# Patient Record
Sex: Female | Born: 1990 | Race: Black or African American | Hispanic: No | Marital: Married | Smoking: Former smoker
Health system: Southern US, Community
[De-identification: ages and names within clinical notes are randomized; demographics above are authoritative.]

## PROBLEM LIST (undated history)

## (undated) ENCOUNTER — Inpatient Hospital Stay (HOSPITAL_COMMUNITY): Payer: Self-pay

## (undated) DIAGNOSIS — F32A Depression, unspecified: Secondary | ICD-10-CM

## (undated) DIAGNOSIS — F419 Anxiety disorder, unspecified: Secondary | ICD-10-CM

## (undated) DIAGNOSIS — J45909 Unspecified asthma, uncomplicated: Secondary | ICD-10-CM

## (undated) DIAGNOSIS — F329 Major depressive disorder, single episode, unspecified: Secondary | ICD-10-CM

## (undated) DIAGNOSIS — K219 Gastro-esophageal reflux disease without esophagitis: Secondary | ICD-10-CM

## (undated) DIAGNOSIS — D649 Anemia, unspecified: Secondary | ICD-10-CM

## (undated) HISTORY — PX: WISDOM TOOTH EXTRACTION: SHX21

---

## 2008-02-03 DIAGNOSIS — F53 Postpartum depression: Secondary | ICD-10-CM | POA: Insufficient documentation

## 2008-02-03 DIAGNOSIS — D649 Anemia, unspecified: Secondary | ICD-10-CM

## 2008-02-03 HISTORY — DX: Anemia, unspecified: D64.9

## 2011-03-20 ENCOUNTER — Other Ambulatory Visit: Payer: Self-pay

## 2011-03-20 ENCOUNTER — Emergency Department (HOSPITAL_COMMUNITY)
Admission: EM | Admit: 2011-03-20 | Discharge: 2011-03-20 | Disposition: A | Discharge status: Home / Self Care | Payer: Self-pay | Attending: Emergency Medicine | Admitting: Emergency Medicine

## 2011-03-20 ENCOUNTER — Encounter (HOSPITAL_COMMUNITY): Payer: Self-pay

## 2011-03-20 DIAGNOSIS — R42 Dizziness and giddiness: Secondary | ICD-10-CM | POA: Insufficient documentation

## 2011-03-20 DIAGNOSIS — F1610 Hallucinogen abuse, uncomplicated: Secondary | ICD-10-CM

## 2011-03-20 DIAGNOSIS — T43624A Poisoning by amphetamines, undetermined, initial encounter: Secondary | ICD-10-CM | POA: Insufficient documentation

## 2011-03-20 DIAGNOSIS — F1510 Other stimulant abuse, uncomplicated: Secondary | ICD-10-CM | POA: Insufficient documentation

## 2011-03-20 DIAGNOSIS — T43601A Poisoning by unspecified psychostimulants, accidental (unintentional), initial encounter: Secondary | ICD-10-CM | POA: Insufficient documentation

## 2011-03-20 DIAGNOSIS — H9319 Tinnitus, unspecified ear: Secondary | ICD-10-CM | POA: Insufficient documentation

## 2011-03-20 DIAGNOSIS — R5381 Other malaise: Secondary | ICD-10-CM | POA: Insufficient documentation

## 2011-03-20 LAB — DIFFERENTIAL
Basophils Absolute: 0 10*3/uL (ref 0.0–0.1)
Basophils Relative: 0 % (ref 0–1)
Eosinophils Absolute: 0 10*3/uL (ref 0.0–0.7)
Eosinophils Relative: 1 % (ref 0–5)
Monocytes Absolute: 0.4 10*3/uL (ref 0.1–1.0)
Monocytes Relative: 7 % (ref 3–12)
Neutro Abs: 3.8 10*3/uL (ref 1.7–7.7)

## 2011-03-20 LAB — CBC
HCT: 42.4 % (ref 36.0–46.0)
Hemoglobin: 14.7 g/dL (ref 12.0–15.0)
MCH: 28 pg (ref 26.0–34.0)
MCHC: 34.7 g/dL (ref 30.0–36.0)
RDW: 12.5 % (ref 11.5–15.5)

## 2011-03-20 LAB — RAPID URINE DRUG SCREEN, HOSP PERFORMED
Amphetamines: POSITIVE — AB
Opiates: NOT DETECTED

## 2011-03-20 LAB — BASIC METABOLIC PANEL
BUN: 10 mg/dL (ref 6–23)
Calcium: 10.1 mg/dL (ref 8.4–10.5)
Chloride: 106 mEq/L (ref 96–112)
Creatinine, Ser: 0.7 mg/dL (ref 0.50–1.10)
GFR calc Af Amer: >90 mL/min (ref >90)
GFR calc non Af Amer: >90 mL/min (ref >90)

## 2011-03-20 LAB — URINALYSIS, ROUTINE W REFLEX MICROSCOPIC
Bilirubin Urine: NEGATIVE
Ketones, ur: NEGATIVE mg/dL
Nitrite: NEGATIVE
Urobilinogen, UA: 0.2 mg/dL (ref 0.0–1.0)

## 2011-03-20 MED ORDER — POTASSIUM CHLORIDE CRYS ER 20 MEQ PO TBCR
40.0000 meq | EXTENDED_RELEASE_TABLET | Freq: Once | ORAL | Status: AC
  Administered 2011-03-20: 40 meq via ORAL
  Filled 2011-03-20: qty 2

## 2011-03-20 MED ORDER — SODIUM CHLORIDE 0.9 % IV BOLUS (SEPSIS)
1000.0000 mL | Freq: Once | INTRAVENOUS | Status: AC
  Administered 2011-03-20: 1000 mL via INTRAVENOUS

## 2011-03-20 MED ORDER — ONDANSETRON HCL 4 MG/2ML IJ SOLN
4.0000 mg | Freq: Once | INTRAMUSCULAR | Status: AC
  Administered 2011-03-20: 4 mg via INTRAVENOUS
  Filled 2011-03-20: qty 2

## 2011-03-20 NOTE — ED Notes (Signed)
Took a capsule  Did not what is pt sleepy  Friend states it is a recreational drug

## 2011-03-20 NOTE — ED Notes (Signed)
Pt placed in gown, on monitor, with continuous blood pressure and pulse oximetry 

## 2011-03-20 NOTE — ED Provider Notes (Signed)
History     CSN: [id number]  Arrival date & time 03/20/11  1043   First MD Initiated Contact with Patient 03/20/11 1108      Chief Complaint  Patient presents with  . Ingestion    (Consider location/radiation/quality/duration/timing/severity/associated sxs/prior treatment) HPI Comments: She ingested "molly" which is ecstasy.  Complains of some tinnitus and gen weakness and fatigue  Patient is a 21 y.o. female presenting with Ingested Medication. The history is provided by the patient. No language interpreter was used.  Ingestion This is a new problem. The current episode started less than 1 hour ago. The problem occurs constantly. The problem has not changed since onset.Pertinent negatives include no chest pain, no abdominal pain, no headaches and no shortness of breath. The symptoms are aggravated by nothing. The symptoms are relieved by nothing. She has tried nothing for the symptoms. The treatment provided no relief.    History reviewed. No pertinent past medical history.  Past Surgical History  Procedure Date  . Cesarean section     No family history on file.  History  Substance Use Topics  . Smoking status: Current Everyday Smoker  . Smokeless tobacco: Not on file  . Alcohol Use: No    OB History    Grav Para Term Preterm Abortions TAB SAB Ect Mult Living                  Review of Systems  Constitutional: Positive for fatigue. Negative for fever, chills and activity change.  HENT: Negative for congestion, sore throat, rhinorrhea, neck pain and neck stiffness.   Respiratory: Negative for cough and shortness of breath.   Cardiovascular: Negative for chest pain and palpitations.  Gastrointestinal: Negative for nausea, vomiting and abdominal pain.  Genitourinary: Negative for dysuria, urgency, frequency and flank pain.  Neurological: Positive for light-headedness. Negative for dizziness, weakness, numbness and headaches.  All other systems reviewed and are  negative.    Allergies  Review of patient's allergies indicates no known allergies.  Home Medications   Current Outpatient Rx  Name Route Sig Dispense Refill  . ALBUTEROL SULFATE HFA 108 (90 BASE) MCG/ACT IN AERS Inhalation Inhale 2 puffs into the lungs every 6 (six) hours as needed. For shortness of breath    . SERTRALINE HCL 50 MG PO TABS Oral Take 50 mg by mouth daily.      BP 103/72  Pulse 96  Temp(Src) 98.3 F (36.8 C) (Oral)  Resp 16  SpO2 100%  LMP 03/20/2011  Physical Exam  Nursing note and vitals reviewed. Constitutional: She appears well-developed and well-nourished. No distress.  HENT:  Head: Normocephalic and atraumatic.  Mouth/Throat: Oropharynx is clear and moist.  Eyes: Conjunctivae and EOM are normal. Pupils are equal, round, and reactive to light.  Neck: Normal range of motion. Neck supple.  Cardiovascular: Normal rate, regular rhythm, normal heart sounds and intact distal pulses.  Exam reveals no gallop and no friction rub.   No murmur heard. Pulmonary/Chest: Effort normal and breath sounds normal. No respiratory distress.  Abdominal: Soft. Bowel sounds are normal. There is no tenderness.  Musculoskeletal: Normal range of motion. She exhibits no tenderness.  Lymphadenopathy:    She has no cervical adenopathy.  Neurological: No cranial nerve deficit.       Patient sleepy but easily arousable.  Skin: Skin is warm and dry. No rash noted.    ED Course  Procedures (including critical care time)   Date: 03/20/2011  Rate: 64  Rhythm: normal sinus rhythm  QRS Axis: normal  Intervals: normal  ST/T Wave abnormalities: normal  Conduction Disutrbances:none  Narrative Interpretation:   Old EKG Reviewed: none available  Labs Reviewed  CBC - Abnormal; Notable for the following:    RBC 5.25 (*)    All other components within normal limits  BASIC METABOLIC PANEL - Abnormal; Notable for the following:    Potassium 3.1 (*)    All other components within  normal limits  URINE RAPID DRUG SCREEN (HOSP PERFORMED) - Abnormal; Notable for the following:    Amphetamines POSITIVE (*)    Tetrahydrocannabinol POSITIVE (*)    All other components within normal limits  DIFFERENTIAL  URINALYSIS, ROUTINE W REFLEX MICROSCOPIC  PREGNANCY, URINE  GLUCOSE, CAPILLARY   No results found.   1. Ecstasy abuse       MDM  Patient symptoms are secondary to ecstasy abuse. I explained that she should not take ecstasy. Her blood work is unremarkable. Her urine shows amphetamines and THC. She is instructed to refrain from additional drug use and continue aggressive oral hydration at home.        Dayton Bailiff, MD 03/20/11 1315

## 2011-03-20 NOTE — ED Notes (Signed)
Pt took an unknown powdered pill at  1100. Pt will not say why she took it

## 2011-03-20 NOTE — ED Notes (Addendum)
Pt received to [facility] 6 with c/o headache. Pt claimed that she took a powdered pill but does not know what it is. Pt is attached to the monitor. Seen and examined by Dr. Brooke Dare. Pt is noted to be slow to answer questions but appropriate.

## 2011-03-20 NOTE — ED Notes (Signed)
CBG 90 at 11:41

## 2011-11-05 ENCOUNTER — Encounter (HOSPITAL_COMMUNITY): Payer: Self-pay | Admitting: *Deleted

## 2011-11-05 ENCOUNTER — Inpatient Hospital Stay (HOSPITAL_COMMUNITY)
Admission: AD | Admit: 2011-11-05 | Discharge: 2011-11-05 | Disposition: A | Discharge status: Home / Self Care | Payer: BC Managed Care – PPO | Source: Ambulatory Visit | Attending: Obstetrics & Gynecology | Admitting: Obstetrics & Gynecology

## 2011-11-05 DIAGNOSIS — O210 Mild hyperemesis gravidarum: Secondary | ICD-10-CM | POA: Insufficient documentation

## 2011-11-05 DIAGNOSIS — O219 Vomiting of pregnancy, unspecified: Secondary | ICD-10-CM | POA: Diagnosis present

## 2011-11-05 HISTORY — DX: Unspecified asthma, uncomplicated: J45.909

## 2011-11-05 LAB — URINALYSIS, ROUTINE W REFLEX MICROSCOPIC
Bilirubin Urine: NEGATIVE
Glucose, UA: NEGATIVE mg/dL
Ketones, ur: NEGATIVE mg/dL
Leukocytes, UA: NEGATIVE
pH: 6 (ref 5.0–8.0)

## 2011-11-05 MED ORDER — ONDANSETRON HCL 4 MG PO TABS: 4.0000 mg | ORAL_TABLET | Freq: Three times a day (TID) | ORAL | Status: DC | PRN

## 2011-11-05 MED ORDER — SODIUM CHLORIDE 0.9 % IV SOLN
25.0000 mg | INTRAVENOUS | Status: AC
  Administered 2011-11-05: 25 mg via INTRAVENOUS
  Filled 2011-11-05: qty 1

## 2011-11-05 MED ORDER — ONDANSETRON HCL 4 MG/2ML IJ SOLN
4.0000 mg | INTRAMUSCULAR | Status: AC
  Administered 2011-11-05: 4 mg via INTRAVENOUS
  Filled 2011-11-05: qty 2

## 2011-11-05 MED ORDER — PROMETHAZINE HCL 25 MG PO TABS: 12.5000 mg | ORAL_TABLET | Freq: Four times a day (QID) | ORAL | Status: DC | PRN

## 2011-11-05 MED ORDER — LACTATED RINGERS IV BOLUS (SEPSIS): 1000.0000 mL | Freq: Once | INTRAVENOUS | Status: DC

## 2011-11-05 NOTE — MAU Provider Note (Signed)
Chief Complaint: Emesis During Pregnancy   First Provider Initiated Contact with Patient 11/05/11 2013     SUBJECTIVE HPI: [name] is a 21 y.o. G1P1001 at Unknown by LMP who presents to maternity admissions reporting nausea and vomiting x6 in last 24 hours, difficulty keeping any food or liquids down, and diarrhea x3 since yesterday.  [facility]  Patient's last menstrual period was 09/25/2011.  She reports some fatigue and weakness today but denies abdominal pain, LOF, vaginal bleeding, vaginal itching/burning, urinary symptoms, h/a, dizziness, n/v, or fever/chills.     Past Medical History  Diagnosis Date  . Asthma    Past Surgical History  Procedure Date  . Cesarean section   . Wisdom tooth extraction    History   Social History  . Marital Status: Single    Spouse Name: N/A    Number of Children: N/A  . Years of Education: N/A   Occupational History  . Not on file.   Social History Main Topics  . Smoking status: Former Smoker    Quit date: 11/04/2011  . Smokeless tobacco: Not on file  . Alcohol Use: No  . Drug Use: Yes  . Sexually Active: Yes   Other Topics Concern  . Not on file   Social History Narrative  . No narrative on file   No current facility-administered medications on file prior to encounter.   Current Outpatient Prescriptions on File Prior to Encounter  Medication Sig Dispense Refill  . albuterol (PROVENTIL HFA;VENTOLIN HFA) 108 (90 BASE) MCG/ACT inhaler Inhale 2 puffs into the lungs every 6 (six) hours as needed. For shortness of breath      . sertraline (ZOLOFT) 50 MG tablet Take 50 mg by mouth daily.       No Known Allergies  ROS: Pertinent items in HPI  OBJECTIVE Blood pressure 110/72, pulse 79, temperature 98.3 F (36.8 C), temperature source Oral, resp. rate 18, height 5' 5.5" (1.664 m), weight 58.968 kg (130 lb), last menstrual period 09/25/2011, SpO2 100.00%. GENERAL:  Well-developed, well-nourished female in no acute distress.  HEENT: Normocephalic HEART: normal rate RESP: normal effort ABDOMEN: Soft, non-tender EXTREMITIES: Nontender, no edema NEURO: Alert and oriented Pelvic exam deferred  LAB RESULTS Results for orders placed during the hospital encounter of 11/05/11 (from the past 24 hour(s))  URINALYSIS, ROUTINE W REFLEX MICROSCOPIC     Status: Abnormal   Collection Time   11/05/11  7:25 PM      Component Value Range   Color, Urine YELLOW  YELLOW   APPearance CLEAR  CLEAR   Specific Gravity, Urine >1.030 (*) 1.005 - 1.030   pH 6.0  5.0 - 8.0   Glucose, UA NEGATIVE  NEGATIVE mg/dL   Hgb urine dipstick NEGATIVE  NEGATIVE   Bilirubin Urine NEGATIVE  NEGATIVE   Ketones, ur NEGATIVE  NEGATIVE mg/dL   Protein, ur NEGATIVE  NEGATIVE mg/dL   Urobilinogen, UA 0.2  0.0 - 1.0 mg/dL   Nitrite NEGATIVE  NEGATIVE   Leukocytes, UA NEGATIVE  NEGATIVE  POCT PREGNANCY, URINE     Status: Abnormal   Collection Time   11/05/11  7:46 PM      Component Value Range   Preg Test, Ur POSITIVE (*) NEGATIVE    ASSESSMENT 1. Nausea/vomiting in pregnancy     PLAN IV Phenergan 25 mg in 1000 ml LR  Zofran 4 mg IV Pepcid 20 mg IV Pt had relief of symptoms with  treatment in MAU  Discharge home Phenergan 12.5 mg PO Q6 hours Zofran 4 mg PO Q 8 hours Keep scheduled prenatal appointment Return to MAU as needed    Medication List     As of 11/05/2011 10:11 PM    TAKE these medications         albuterol 108 (90 BASE) MCG/ACT inhaler   Commonly known as: PROVENTIL HFA;VENTOLIN HFA   Inhale 2 puffs into the lungs every 6 (six) hours as needed. For shortness of breath      ondansetron 4 MG tablet   Commonly known as: ZOFRAN   Take 1 tablet (4 mg total) by mouth every 8 (eight) hours as needed for nausea.      promethazine 25 MG tablet   Commonly known as: PHENERGAN   Take 0.5 tablets (12.5 mg total) by mouth every 6 (six) hours as needed for nausea.       sertraline 50 MG tablet   Commonly known as: ZOLOFT   Take 50 mg by mouth daily.           Sharen Counter Certified Nurse-Midwife 11/05/2011  8:40 PM

## 2011-11-05 NOTE — MAU Note (Signed)
Pt reports vomiting since yesterday, diarrhea.Marland Kitchen

## 2011-11-05 NOTE — MAU Note (Signed)
Pt reports she [facility]. LMP 09/25/2011, vomiting with pregnancy but worsened over last 24 hours.

## 2011-11-13 ENCOUNTER — Inpatient Hospital Stay (HOSPITAL_COMMUNITY)
Admission: AD | Admit: 2011-11-13 | Discharge: 2011-11-13 | Disposition: A | Discharge status: Home / Self Care | Payer: BC Managed Care – PPO | Source: Ambulatory Visit | Attending: Obstetrics and Gynecology | Admitting: Obstetrics and Gynecology

## 2011-11-13 ENCOUNTER — Encounter (HOSPITAL_COMMUNITY): Payer: Self-pay | Admitting: *Deleted

## 2011-11-13 DIAGNOSIS — O210 Mild hyperemesis gravidarum: Secondary | ICD-10-CM

## 2011-11-13 HISTORY — DX: Major depressive disorder, single episode, unspecified: F32.9

## 2011-11-13 HISTORY — DX: Depression, unspecified: F32.A

## 2011-11-13 LAB — URINALYSIS, ROUTINE W REFLEX MICROSCOPIC
Hgb urine dipstick: NEGATIVE
Leukocytes, UA: NEGATIVE
Specific Gravity, Urine: 1.02 (ref 1.005–1.030)
Urobilinogen, UA: 0.2 mg/dL (ref 0.0–1.0)

## 2011-11-13 MED ORDER — ONDANSETRON 8 MG PO TBDP: 8.0000 mg | ORAL_TABLET | Freq: Three times a day (TID) | ORAL | Status: DC | PRN

## 2011-11-13 MED ORDER — PROMETHAZINE HCL 25 MG RE SUPP: 25.0000 mg | Freq: Once | RECTAL | Status: DC

## 2011-11-13 MED ORDER — ONDANSETRON 8 MG PO TBDP
8.0000 mg | ORAL_TABLET | Freq: Once | ORAL | Status: AC
  Administered 2011-11-13: 8 mg via ORAL
  Filled 2011-11-13: qty 1

## 2011-11-13 NOTE — MAU Provider Note (Signed)
History     CSN: [id number]  Arrival date and time: 11/13/11 1923   None     Chief Complaint  Patient presents with  . Morning Sickness   HPI  Pt is a G2P1001 here at 7 wks IUP by LMP here with report of vomiting x 1.5 weeks.  Pt reports having medication, but unable to hold it down.  Pt states unable to hold down food or drink. No report of fever, body aches, diarrhea or chills.  Reports vomiting x 7 today.     Past Medical History  Diagnosis Date  . Asthma     Past Surgical History  Procedure Date  . Cesarean section   . Wisdom tooth extraction     Family History  Problem Relation Age of Onset  . Other Neg Hx     History  Substance Use Topics  . Smoking status: Former Smoker    Quit date: 11/04/2011  . Smokeless tobacco: Not on file  . Alcohol Use: No    Allergies: No Known Allergies  Prescriptions prior to admission  Medication Sig Dispense Refill  . albuterol (PROVENTIL HFA;VENTOLIN HFA) 108 (90 BASE) MCG/ACT inhaler Inhale 2 puffs into the lungs every 6 (six) hours as needed. For shortness of breath      . ondansetron (ZOFRAN) 4 MG tablet Take 1 tablet (4 mg total) by mouth every 8 (eight) hours as needed for nausea.  20 tablet  3  . promethazine (PHENERGAN) 25 MG tablet Take 0.5 tablets (12.5 mg total) by mouth every 6 (six) hours as needed for nausea.  30 tablet  2  . sertraline (ZOLOFT) 50 MG tablet Take 50 mg by mouth daily.        Review of Systems  Gastrointestinal: Positive for nausea and vomiting.  All other systems reviewed and are negative.   Physical Exam   Blood pressure 108/66, pulse 96, temperature 98.8 F (37.1 C), temperature source Oral, resp. rate 24, height 5' 5.5" (1.664 m), weight 134 lb 6.4 oz (60.963 kg), last menstrual period 09/25/2011.  Physical Exam  Constitutional: She is oriented to person, place, and time. She appears well-developed and well-nourished. No distress.  HENT:  Head: Normocephalic.  Mouth/Throat: Mucous  membranes are normal. Mucous membranes are not dry.  Neck: Normal range of motion. Neck supple.  Cardiovascular: Normal rate, regular rhythm and normal heart sounds.   Respiratory: Effort normal and breath sounds normal.  GI: Soft. There is no tenderness.       Hyperactive bowel sounds  Neurological: She is alert and oriented to person, place, and time.  Skin: Skin is warm and dry.    MAU Course  Procedures  Results for orders placed during the hospital encounter of 11/13/11 (from the past 24 hour(s))  URINALYSIS, ROUTINE W REFLEX MICROSCOPIC     Status: Abnormal   Collection Time   11/13/11  7:28 PM      Component Value Range   Color, Urine YELLOW  YELLOW   APPearance HAZY (*) CLEAR   Specific Gravity, Urine 1.020  1.005 - 1.030   pH 7.5  5.0 - 8.0   Glucose, UA NEGATIVE  NEGATIVE mg/dL   Hgb urine dipstick NEGATIVE  NEGATIVE   Bilirubin Urine NEGATIVE  NEGATIVE   Ketones, ur NEGATIVE  NEGATIVE mg/dL   Protein, ur NEGATIVE  NEGATIVE mg/dL   Urobilinogen, UA 0.2  0.0 - 1.0 mg/dL   Nitrite NEGATIVE  NEGATIVE   Leukocytes, UA NEGATIVE  NEGATIVE  Zofran 8 mg ODT  Assessment and Plan    [facility] 11/13/2011, 7:59 PM   Assumed care of pt at 2100.  Tolerating PO's after Zofran ODT. Denies Nausea/Vomiting. Requesting Rx for Zofran ODT.  Assessment: 1. Morning sickness    Plan: D/C home per consult w/ Dr. Ambrose Mantle. Follow-up Information    Follow up with Bing Plume, MD. (as scheduled)    Contact information:   [facility] [address], [address] [place] [address] [phone]       Follow up with [facility]ERN[facility]. (As needed if symptoms worsen)    Contact information:   [address] [id number] [place] [address] [phone]          Medication List     As of 11/13/2011 10:34 PM    START taking these medications         ondansetron 8 MG  disintegrating tablet   Commonly known as: ZOFRAN-ODT   Take 1 tablet (8 mg total) by mouth every 8 (eight) hours as needed for nausea.      CONTINUE taking these medications         albuterol 108 (90 BASE) MCG/ACT inhaler   Commonly known as: PROVENTIL HFA;VENTOLIN HFA      ondansetron 4 MG tablet   Commonly known as: ZOFRAN   Take 1 tablet (4 mg total) by mouth every 8 (eight) hours as needed for nausea.      promethazine 25 MG tablet   Commonly known as: PHENERGAN   Take 0.5 tablets (12.5 mg total) by mouth every 6 (six) hours as needed for nausea.      sertraline 50 MG tablet   Commonly known as: ZOLOFT          Where to get your medications    These are the prescriptions that you need to pick up.   You may get these medications from any pharmacy.         ondansetron 8 MG disintegrating tablet            [place] 11/13/2011 10:34 PM

## 2011-11-13 NOTE — MAU Note (Signed)
I've been throwing up for a wk and a half. I have medicine for it but keep throwing it up. Today unable to keep down anything. No diarrhea

## 2011-12-07 LAB — OB RESULTS CONSOLE HEPATITIS B SURFACE ANTIGEN: Hepatitis B Surface Ag: NEGATIVE

## 2011-12-07 LAB — OB RESULTS CONSOLE RPR: RPR: NONREACTIVE

## 2011-12-08 ENCOUNTER — Inpatient Hospital Stay (HOSPITAL_COMMUNITY)
Admission: AD | Admit: 2011-12-08 | Discharge: 2011-12-08 | Disposition: A | Discharge status: Home / Self Care | Payer: BC Managed Care – PPO | Source: Ambulatory Visit | Attending: Obstetrics and Gynecology | Admitting: Obstetrics and Gynecology

## 2011-12-08 ENCOUNTER — Encounter (HOSPITAL_COMMUNITY): Payer: Self-pay | Admitting: *Deleted

## 2011-12-08 DIAGNOSIS — E860 Dehydration: Secondary | ICD-10-CM | POA: Insufficient documentation

## 2011-12-08 DIAGNOSIS — O210 Mild hyperemesis gravidarum: Secondary | ICD-10-CM

## 2011-12-08 DIAGNOSIS — O211 Hyperemesis gravidarum with metabolic disturbance: Secondary | ICD-10-CM | POA: Insufficient documentation

## 2011-12-08 LAB — URINALYSIS, ROUTINE W REFLEX MICROSCOPIC
Bilirubin Urine: NEGATIVE
Nitrite: NEGATIVE
Protein, ur: NEGATIVE mg/dL
Specific Gravity, Urine: 1.025 (ref 1.005–1.030)
Urobilinogen, UA: 0.2 mg/dL (ref 0.0–1.0)

## 2011-12-08 MED ORDER — DOXYLAMINE-PYRIDOXINE 10-10 MG PO TBEC: 2.0000 | DELAYED_RELEASE_TABLET | Freq: Every day | ORAL | Status: DC

## 2011-12-08 MED ORDER — PROMETHAZINE HCL 25 MG/ML IJ SOLN
25.0000 mg | Freq: Once | INTRAVENOUS | Status: AC
  Administered 2011-12-08: 25 mg via INTRAVENOUS
  Filled 2011-12-08: qty 1

## 2011-12-08 MED ORDER — PROMETHAZINE HCL 25 MG RE SUPP: 25.0000 mg | Freq: Four times a day (QID) | RECTAL | Status: DC | PRN

## 2011-12-08 MED ORDER — ONDANSETRON 4 MG PO TBDP: 4.0000 mg | ORAL_TABLET | Freq: Four times a day (QID) | ORAL | Status: DC | PRN

## 2011-12-08 NOTE — MAU Note (Signed)
Been throwing up a lot.  Head hurts (since yesterday), been really dizzy, eyes blurring.  Going on since Friday.  Was given meds from MD; not taken since Friday- thinks it made it worse.

## 2011-12-08 NOTE — MAU Provider Note (Signed)
History     CSN: [id number]  Arrival date and time: 12/08/11 1349   First Provider Initiated Contact with Patient 12/08/11 1443      Chief Complaint  Patient presents with  . Emesis  . Dizziness   HPI 21 y.o. G2P1001 at [redacted]w[redacted]d with N/V for 4 days (since Friday). Was doing better since last MAU visit. Able to keep down a little food and fluids but not her nausea medications. Decreased urination, no dysuria. Constipation alternating with diarrhea with abdominal cramps esp in AM. No fever/chills. Headache, blurry vision. No bleeding, mild cramping.   Last office visit yesterday but did not mention nausea/vomiting because it was not that bad at the time.   OB History    Grav Para Term Preterm Abortions TAB SAB Ect Mult Living   2 1 1  0 0 0 0 0 0 1      Past Medical History  Diagnosis Date  . Asthma   . Depression     Past Surgical History  Procedure Date  . Cesarean section   . Wisdom tooth extraction     Family History  Problem Relation Age of Onset  . Other Neg Hx     History  Substance Use Topics  . Smoking status: Former Smoker    Quit date: 11/04/2011  . Smokeless tobacco: Not on file  . Alcohol Use: No    Allergies: No Known Allergies  Prescriptions prior to admission  Medication Sig Dispense Refill  . Prenatal Vit-Fe Fumarate-FA (PRENATAL MULTIVITAMIN) TABS Take 1 tablet by mouth daily.      Marland Kitchen albuterol (PROVENTIL HFA;VENTOLIN HFA) 108 (90 BASE) MCG/ACT inhaler Inhale 2 puffs into the lungs every 6 (six) hours as needed. For shortness of breath        Review of Systems  Constitutional: Negative for fever and chills.  Eyes: Positive for blurred vision.  Gastrointestinal: Positive for nausea, vomiting, abdominal pain, diarrhea and constipation. Negative for blood in stool.  Genitourinary: Negative for dysuria, urgency and flank pain.  Musculoskeletal: Negative for back pain.  Neurological: Positive for dizziness and headaches.   Physical Exam    Blood pressure 113/70, pulse 98, temperature 98.2 F (36.8 C), temperature source Oral, resp. rate 18, height 5\' 6"  (1.676 m), weight 60.328 kg (133 lb), last menstrual period 09/25/2011.  Physical Exam  Constitutional: She is oriented to person, place, and time. She appears well-developed and well-nourished. No distress.  HENT:  Head: Normocephalic and atraumatic.       Mucous membranes somewhat dry  Neck: Normal range of motion. Neck supple.  Cardiovascular: Normal rate, regular rhythm and normal heart sounds.   Respiratory: Effort normal and breath sounds normal. No respiratory distress.  GI: Soft. There is no tenderness. There is no rebound and no guarding.  Musculoskeletal: Normal range of motion. She exhibits no edema and no tenderness.  Neurological: She is alert and oriented to person, place, and time.  Skin: Skin is warm and dry.  Psychiatric: She has a normal mood and affect.   Results for orders placed during the hospital encounter of 12/08/11 (from the past 24 hour(s))  URINALYSIS, ROUTINE W REFLEX MICROSCOPIC     Status: Abnormal   Collection Time   12/08/11  2:15 PM      Component Value Range   Color, Urine YELLOW  YELLOW   APPearance HAZY (*) CLEAR   Specific Gravity, Urine 1.025  1.005 - 1.030   pH 6.5  5.0 - 8.0   Glucose,  UA NEGATIVE  NEGATIVE mg/dL   Hgb urine dipstick NEGATIVE  NEGATIVE   Bilirubin Urine NEGATIVE  NEGATIVE   Ketones, ur 15 (*) NEGATIVE mg/dL   Protein, ur NEGATIVE  NEGATIVE mg/dL   Urobilinogen, UA 0.2  0.0 - 1.0 mg/dL   Nitrite NEGATIVE  NEGATIVE   Leukocytes, UA NEGATIVE  NEGATIVE     MAU Course  Procedures  1 liter LR with phenergan given. Patient feeling better Did void x 1 Sipping water, no vomiting  Assessment and Plan  21 y.o. G2P1001 at [redacted]w[redacted]d with Nausea/vomiting, mild dehydration - Rx for Diclegis, Zofran ODT, phenergan supp Tolerating PO at discharge Discussed with Dr. Ambrose Mantle - d/c home and f/u as scheduled or as  needed.  Napoleon Form 12/08/2011, 2:45 PM

## 2012-02-17 ENCOUNTER — Inpatient Hospital Stay (HOSPITAL_COMMUNITY)
Admission: AD | Admit: 2012-02-17 | Discharge: 2012-02-17 | Disposition: A | Discharge status: Home / Self Care | Payer: BC Managed Care – PPO | Source: Ambulatory Visit | Attending: Obstetrics and Gynecology | Admitting: Obstetrics and Gynecology

## 2012-02-17 ENCOUNTER — Encounter (HOSPITAL_COMMUNITY): Payer: Self-pay | Admitting: *Deleted

## 2012-02-17 DIAGNOSIS — J45909 Unspecified asthma, uncomplicated: Secondary | ICD-10-CM | POA: Diagnosis present

## 2012-02-17 DIAGNOSIS — O99891 Other specified diseases and conditions complicating pregnancy: Secondary | ICD-10-CM | POA: Insufficient documentation

## 2012-02-17 DIAGNOSIS — F1610 Hallucinogen abuse, uncomplicated: Secondary | ICD-10-CM | POA: Diagnosis not present

## 2012-02-17 DIAGNOSIS — J3489 Other specified disorders of nose and nasal sinuses: Secondary | ICD-10-CM | POA: Insufficient documentation

## 2012-02-17 DIAGNOSIS — J45901 Unspecified asthma with (acute) exacerbation: Secondary | ICD-10-CM

## 2012-02-17 MED ORDER — IPRATROPIUM BROMIDE 0.02 % IN SOLN
0.5000 mg | Freq: Once | RESPIRATORY_TRACT | Status: AC
  Administered 2012-02-17: 0.5 mg via RESPIRATORY_TRACT

## 2012-02-17 MED ORDER — PSEUDOEPHEDRINE HCL 60 MG PO TABS: 60.0000 mg | ORAL_TABLET | ORAL | Status: DC | PRN

## 2012-02-17 MED ORDER — BECLOMETHASONE DIPROPIONATE 40 MCG/ACT IN AERS: 2.0000 | INHALATION_SPRAY | Freq: Two times a day (BID) | RESPIRATORY_TRACT | Status: DC

## 2012-02-17 MED ORDER — CETIRIZINE HCL 10 MG PO CAPS: 1.0000 | ORAL_CAPSULE | Freq: Every day | ORAL | Status: DC

## 2012-02-17 MED ORDER — ALBUTEROL SULFATE (5 MG/ML) 0.5% IN NEBU
2.5000 mg | INHALATION_SOLUTION | Freq: Once | RESPIRATORY_TRACT | Status: AC
  Administered 2012-02-17: 2.5 mg via RESPIRATORY_TRACT

## 2012-02-17 MED ORDER — ALBUTEROL SULFATE (5 MG/ML) 0.5% IN NEBU
INHALATION_SOLUTION | RESPIRATORY_TRACT | Status: AC
  Administered 2012-02-17: 2.5 mg via RESPIRATORY_TRACT
  Filled 2012-02-17: qty 0.5

## 2012-02-17 MED ORDER — IPRATROPIUM BROMIDE 0.02 % IN SOLN
RESPIRATORY_TRACT | Status: AC
  Administered 2012-02-17: 0.5 mg via RESPIRATORY_TRACT
  Filled 2012-02-17: qty 2.5

## 2012-02-17 NOTE — MAU Provider Note (Signed)
History     CSN: [id number]  Arrival date and time: 02/17/12 1205   First Provider Initiated Contact with Patient 02/17/12 1230      Chief Complaint  Patient presents with  . Asthma  . Respiratory Distress   HPI This is a 22 y.o. female at [redacted]w[redacted]d who presents with c/o wheezing.  Has had asthma since childhood but has only ever used an inhaler. Has never seen an allergy specialist. Over the past year, has had to use her inhaler multiple times per day on a daily basis. Denies fever but has had recent nasal congestion and cold symptoms. [facility]   RN Note:  Pt sent from Dr. Emeline Darling office for wheezing in lung fields. Pt is asthmatic and requiring inhaler to be used x 4 in an hour. Sent to MAU for breathing treatments.      OB History    Grav Para Term Preterm Abortions TAB SAB Ect Mult Living   2 1 1  0 0 0 0 0 0 1      Past Medical History  Diagnosis Date  . Asthma   . Depression     Past Surgical History  Procedure Date  . Cesarean section   . Wisdom tooth extraction     Family History  Problem Relation Age of Onset  . Other Neg Hx   . Asthma Mother   . Arthritis Mother   . Diabetes Mother   . Hypertension Mother   . Hypertension Father   . Asthma Sister     History  Substance Use Topics  . Smoking status: Former Smoker    Quit date: 11/04/2011  . Smokeless tobacco: Not on file  . Alcohol Use: No    Allergies: No Known Allergies  Prescriptions prior to admission  Medication Sig Dispense Refill  . albuterol (PROVENTIL HFA;VENTOLIN HFA) 108 (90 BASE) MCG/ACT inhaler Inhale 2 puffs into the lungs every 6 (six) hours as needed. For shortness of breath      . Prenatal Vit-Fe Fumarate-FA (PRENATAL MULTIVITAMIN) TABS Take 1 tablet by mouth daily.        Review of Systems  Constitutional: Negative for fever and chills.  HENT: Positive for congestion. Negative for sore throat.   Respiratory: Positive for cough and wheezing.     Gastrointestinal: Negative for heartburn, nausea, vomiting and abdominal pain.  Genitourinary: Negative for dysuria.  Musculoskeletal: Negative for myalgias.  Neurological: Negative for headaches.    Physical Exam   Blood pressure 123/68, pulse 102, temperature 98.8 F (37.1 C), temperature source Oral, resp. rate 20, height 5' 5.5" (1.664 m), weight 64.864 kg (143 lb), last menstrual period 09/25/2011, SpO2 100.00%.  Physical Exam  Constitutional: She is oriented to person, place, and time. She appears well-developed and well-nourished. No distress.  HENT:  Head: Normocephalic.  Cardiovascular: Normal rate.   Respiratory: Effort normal. No respiratory distress. She has wheezes. She has no rales. She exhibits no tenderness.  GI: Soft. She exhibits no distension. There is no tenderness. There is no rebound and no guarding.  Musculoskeletal: Normal range of motion.  Neurological: She is alert and oriented to person, place, and time.  Skin: Skin is warm and dry.  Psychiatric: She has a normal mood and affect.    MAU Course  Procedures  MDM Had Albuterol treatment with good relief. No wheezing now. Lungs clear bilaterally. Has a lot of nasal congestion.   Assessment and Plan  A:  SIUP at [redacted]w[redacted]d  Asthma Flare      Nasal Congestion, probably a cold        P:  Discussed with Dr Jackelyn Knife       Will Rx Zyrtec to cover any baseline allergy response       Will Rx QVAR inhaler per asthma guidelines bid        Office to refer her to Allergy/Asthma specialist for long term asthma plan       Reviewed rescue inhaler should be used only when wheezing despite baseline meds.        Rx sudafed for nasal congestion         [name] 02/17/2012, 2:00 PM

## 2012-02-17 NOTE — MAU Note (Signed)
Pt sent from Dr. Emeline Darling office for wheezing in lung fields.  Pt is asthmatic and requiring inhaler to be used x 4 in an hour.  Sent to MAU for breathing treatments.

## 2012-03-19 ENCOUNTER — Inpatient Hospital Stay (HOSPITAL_COMMUNITY)
Admission: AD | Admit: 2012-03-19 | Discharge: 2012-03-19 | Disposition: A | Discharge status: Home / Self Care | Payer: Medicaid Other | Source: Ambulatory Visit | Attending: Obstetrics and Gynecology | Admitting: Obstetrics and Gynecology

## 2012-03-19 ENCOUNTER — Encounter (HOSPITAL_COMMUNITY): Payer: Self-pay | Admitting: *Deleted

## 2012-03-19 DIAGNOSIS — R109 Unspecified abdominal pain: Secondary | ICD-10-CM

## 2012-03-19 DIAGNOSIS — O99891 Other specified diseases and conditions complicating pregnancy: Secondary | ICD-10-CM | POA: Insufficient documentation

## 2012-03-19 DIAGNOSIS — O4700 False labor before 37 completed weeks of gestation, unspecified trimester: Secondary | ICD-10-CM | POA: Insufficient documentation

## 2012-03-19 DIAGNOSIS — O26899 Other specified pregnancy related conditions, unspecified trimester: Secondary | ICD-10-CM

## 2012-03-19 LAB — URINALYSIS, ROUTINE W REFLEX MICROSCOPIC
Glucose, UA: NEGATIVE mg/dL
Hgb urine dipstick: NEGATIVE
Ketones, ur: NEGATIVE mg/dL
Protein, ur: NEGATIVE mg/dL
Urobilinogen, UA: 0.2 mg/dL (ref 0.0–1.0)

## 2012-03-19 LAB — CBC
HCT: 33 % — ABNORMAL LOW (ref 36.0–46.0)
Hemoglobin: 10.9 g/dL — ABNORMAL LOW (ref 12.0–15.0)
MCH: 26.9 pg (ref 26.0–34.0)
MCV: 81.5 fL (ref 78.0–100.0)
RBC: 4.05 MIL/uL (ref 3.87–5.11)
WBC: 9 10*3/uL (ref 4.0–10.5)

## 2012-03-19 LAB — URINE MICROSCOPIC-ADD ON

## 2012-03-19 MED ORDER — SIMETHICONE 80 MG PO CHEW
80.0000 mg | CHEWABLE_TABLET | Freq: Once | ORAL | Status: AC
  Administered 2012-03-19: 80 mg via ORAL
  Filled 2012-03-19: qty 1

## 2012-03-19 MED ORDER — ACETAMINOPHEN 325 MG PO TABS
650.0000 mg | ORAL_TABLET | Freq: Once | ORAL | Status: AC
  Administered 2012-03-19: 650 mg via ORAL
  Filled 2012-03-19: qty 2

## 2012-03-19 NOTE — MAU Note (Signed)
Got off work at 2100 and was cramping some. Contractions started about 2245. No bleeding or d/c.Marland Kitchen

## 2012-03-19 NOTE — MAU Provider Note (Signed)
History     CSN: [id number]  Arrival date and time: 03/19/12 4098   First Provider Initiated Contact with Patient 03/19/12 0229      Chief Complaint  Patient presents with  . Contractions   HPI [name] 22 y.o. [redacted]w[redacted]d  Comes to MAU with lower abdominal pain since 10 pm tonight.  Pain continued to progress until she was feeling contractions so came to MAU.  While in MAU contractions seemed to slow down (only had 2 in first hour in MAU) but now has continuous lower abdominal pain.  Is feeling the baby move.  Last BM was today.  No vomiting or diarrhea.  No change in appetite.  OB History   Grav Para Term Preterm Abortions TAB SAB Ect Mult Living   2 1 1  0 0 0 0 0 0 1      Past Medical History  Diagnosis Date  . Asthma   . Depression     Past Surgical History  Procedure Laterality Date  . Cesarean section    . Wisdom tooth extraction      Family History  Problem Relation Age of Onset  . Other Neg Hx   . Asthma Mother   . Arthritis Mother   . Diabetes Mother   . Hypertension Mother   . Hypertension Father   . Asthma Sister     History  Substance Use Topics  . Smoking status: Former Smoker    Quit date: 11/04/2011  . Smokeless tobacco: Not on file  . Alcohol Use: No    Allergies: No Known Allergies  Prescriptions prior to admission  Medication Sig Dispense Refill  . albuterol (PROVENTIL HFA;VENTOLIN HFA) 108 (90 BASE) MCG/ACT inhaler Inhale 2 puffs into the lungs every 6 (six) hours as needed. For shortness of breath      . beclomethasone (QVAR) 40 MCG/ACT inhaler Inhale 2 puffs into the lungs 2 (two) times daily.  1 Inhaler  12  . Cetirizine HCl 10 MG CAPS Take 1 capsule (10 mg total) by mouth daily.  30 capsule  2  . Prenatal Vit-Fe Fumarate-FA (PRENATAL MULTIVITAMIN) TABS Take 1 tablet by mouth daily.      . pseudoephedrine (SUDAFED) 60 MG tablet Take 1 tablet (60 mg total) by mouth every 4 (four) hours as needed for congestion.  30 tablet  0     Review of Systems  Constitutional: Negative for fever.       Normal appetite  Gastrointestinal: Positive for abdominal pain. Negative for nausea, vomiting, diarrhea and constipation.  Genitourinary:       No vaginal discharge. No vaginal bleeding.  No vaginal leaking. No dysuria.   Physical Exam   Blood pressure 130/68, pulse 96, temperature 97.9 F (36.6 C), resp. rate 20, height 5' 5.5" (1.664 m), weight 71.305 kg (157 lb 3.2 oz), last menstrual period 09/25/2011, SpO2 100.00%.  Physical Exam  Nursing note and vitals reviewed. Constitutional: She is oriented to person, place, and time. She appears well-developed and well-nourished.  HENT:  Head: Normocephalic.  Eyes: EOM are normal.  Neck: Neck supple.  GI: Soft. There is tenderness. There is no rebound and no guarding.  Tender all across lower abdomen.  Is very sensitive to any touch.  Provider gently palpating for contractions was uncomfortable for client. FHT 150 on monitor with moderate variability.  Genitourinary:  Speculum exam done.  Small amount of white discharge.  No leaking.  No bleeding Cervix appears closed. Cervical exam - closed and thick Cervical  exam closed and thick.  FFN not sent.  Musculoskeletal: Normal range of motion.  Neurological: She is alert and oriented to person, place, and time.  Skin: Skin is warm and dry.  Psychiatric: She has a normal mood and affect.    MAU Course  Procedures Results for orders placed during the hospital encounter of 03/19/12 (from the past 24 hour(s))  URINALYSIS, ROUTINE W REFLEX MICROSCOPIC     Status: Abnormal   Collection Time    03/19/12  1:40 AM      Result Value Range   Color, Urine YELLOW  YELLOW   APPearance HAZY (*) CLEAR   Specific Gravity, Urine 1.020  1.005 - 1.030   pH 7.5  5.0 - 8.0   Glucose, UA NEGATIVE  NEGATIVE mg/dL   Hgb urine dipstick NEGATIVE  NEGATIVE   Bilirubin Urine NEGATIVE  NEGATIVE   Ketones, ur NEGATIVE  NEGATIVE mg/dL   Protein,  ur NEGATIVE  NEGATIVE mg/dL   Urobilinogen, UA 0.2  0.0 - 1.0 mg/dL   Nitrite NEGATIVE  NEGATIVE   Leukocytes, UA TRACE (*) NEGATIVE  URINE MICROSCOPIC-ADD ON     Status: None   Collection Time    03/19/12  1:40 AM      Result Value Range   Squamous Epithelial / LPF RARE  RARE   WBC, UA 0-2  <3 WBC/hpf   RBC / HPF 0-2  <3 RBC/hpf   MDM 0240  Consult with Dr. Ellyn Hack 0330  Client feeling better after tylenol 650 mg po and mylicon tablet by mouth.  CBC does not indicate any infection.  Assessment and Plan  Abdominal pain in pregnancy  Plan Advised to get a maternity support band for abdomen to wear at work. Note given to be out of work on Saturday. Call your doctor if your symptoms worsen. Drink at least 8 8-oz glasses of water every day. Take Tylenol 325 mg 2 tablets by mouth every 4 hours if needed for pain.   [name] 03/19/2012, 2:30 AM

## 2012-03-22 ENCOUNTER — Telehealth (HOSPITAL_COMMUNITY): Payer: Self-pay | Admitting: *Deleted

## 2012-06-13 LAB — OB RESULTS CONSOLE GC/CHLAMYDIA: Chlamydia: NEGATIVE

## 2012-06-13 LAB — OB RESULTS CONSOLE GBS: GBS: POSITIVE

## 2012-06-17 ENCOUNTER — Encounter: Payer: Self-pay | Admitting: Internal Medicine

## 2012-06-17 ENCOUNTER — Ambulatory Visit (INDEPENDENT_AMBULATORY_CARE_PROVIDER_SITE_OTHER): Payer: Medicaid Other | Admitting: Internal Medicine

## 2012-06-17 VITALS — BP 100/60 | HR 120 | Temp 98.9°F | Ht 66.0 in | Wt 180.0 lb

## 2012-06-17 DIAGNOSIS — J45909 Unspecified asthma, uncomplicated: Secondary | ICD-10-CM

## 2012-06-17 MED ORDER — BUDESONIDE-FORMOTEROL FUMARATE 160-4.5 MCG/ACT IN AERO: INHALATION_SPRAY | RESPIRATORY_TRACT | Status: DC

## 2012-06-17 NOTE — Patient Instructions (Addendum)
Symbicort 160 Take 2 puffs first thing in am and then another 2 puffs about 12 hours later and stop qvar   Only use your albuterol (proaire) as a rescue medication to be used if you can't catch your breath by resting or doing a relaxed purse lip breathing pattern. The less you use it, the better it will work when you need it.  Goal is less than twice weekly but ok to use up to every 4 hours if needed  Work on inhaler technique:  relax and gently blow all the way out then take a nice smooth deep breath back in, triggering the inhaler at same time you start breathing in.  Hold for up to 5 seconds if you can.  Rinse and gargle with water when done   If your mouth or throat starts to bother you,   I suggest you time the inhaler to your dental care and after using the inhaler(s) brush teeth and tongue with a baking soda containing toothpaste and when you rinse this out, gargle with it first to see if this helps your mouth and throat.     Please schedule a follow up office visit in 4 weeks, sooner if needed

## 2012-06-17 NOTE — Progress Notes (Signed)
  Subjective:    Patient ID: [name], female    DOB: Dec 07, 1990 MRN: [id number]  HPI  22 yobf quit smoker 11/2011 dx asthma age 22 ok until got to college and started smoking on "maint" alb since 2011 referred 24/16/2014 to pulmonary clinic by Dr Ambrose Mantle at 24 wk pregnancy p ER eval 03/19/12.   06/17/2012 1st pulmonary eval cc last saba 4 h prior to OV  For sob and chest tightness using saba 6-8 x per day with temporary relief only. assos with subjective wheeze. Denies using anything but saba but notes say started on advari Jan 2014 ? Ever took it?  On qvar presently s benefit.  Had nasal congestion relieved with zyrtec and pseudofed  No obvious daytime variabilty or assoc chronic cough or cp   overt sinus or hb symptoms. No unusual exp hx or h/o childhood pna or premature birth to her knowledge.     Also denies any obvious fluctuation of symptoms with weather or environmental changes or other aggravating or alleviating factors except as outlined above.   So far pregnancy not complicated by hbp or edema.    Review of Systems  Constitutional: Negative for fever, chills and unexpected weight change.  HENT: Negative for ear pain, nosebleeds, congestion, sore throat, rhinorrhea, sneezing, trouble swallowing, dental problem, voice change, postnasal drip and sinus pressure.   Eyes: Negative for visual disturbance.  Respiratory: Positive for shortness of breath. Negative for cough and choking.   Cardiovascular: Positive for chest pain. Negative for leg swelling.  Gastrointestinal: Negative for vomiting, abdominal pain and diarrhea.  Genitourinary: Negative for difficulty urinating.  Musculoskeletal: Negative for arthralgias.  Skin: Negative for rash.  Neurological: Negative for tremors, syncope and headaches.  Hematological: Does not bruise/bleed easily.       Objective:   Physical Exam Wt Readings from Last 3 Encounters:  06/17/12 180 lb (81.647 kg)  03/19/12 157 lb 3.2 oz (71.305 kg)   02/17/12 143 lb (64.864 kg)   amb pregnant bf nad with nl vital signs x for resting tachycardia  HEENT: nl dentition, turbinates, and orophanx. Nl external ear canals without cough reflex   NECK :  without JVD/Nodes/TM/ nl carotid upstrokes bilaterally   LUNGS: no acc muscle use, 2 h p last saba >clear to A and P bilaterally without cough on insp or exp maneuvers   CV:  RRR  no s3 or murmur or increase in P2, no edema   ABD:  soft and nontender c/w near term IUP with nl excursion in the supine position. No bruits or organomegaly, bowel sounds nl  MS:  warm without deformities, calf tenderness, cyanosis or clubbing  SKIN: warm and dry without lesions    NEURO:  alert, approp, no deficits         Assessment & Plan:

## 2012-06-19 NOTE — Assessment & Plan Note (Signed)
DDX of  difficult airways managment all start with A and  include Adherence, Ace Inhibitors, Acid Reflux, Active Sinus Disease, Alpha 1 Antitripsin deficiency, Anxiety masquerading as Airways dz,  ABPA,  allergy(esp in young), Aspiration (esp in elderly), Adverse effects of DPI,  Active smokers, plus two Bs  = Bronchiectasis and Beta blocker use..and one C= CHF  In this case Adherence is the biggest issue and starts with  inability to use HFA effectively and also  understand that SABA treats the symptoms but doesn't get to the underlying problem (inflammation).  I used  the analogy of putting steroid cream on a rash to help explain the meaning of topical therapy and the need to get the drug to the target tissue.    The proper method of use, as well as anticipated side effects, of a metered-dose inhaler are discussed and demonstrated to the patient. Improved effectiveness after extensive coaching during this visit to a level of approximately  75% so start symbicort 160 2 bid  Discussed in detail all the  indications, usual  risks and alternatives  relative to the benefits with patient (potential side effects of meds vs risks to baby of poory controlled asthma) who agrees to proceed with bronchoscopy with trial of symbicort.  Goals of care including rules of 2s reviewed.

## 2012-06-23 ENCOUNTER — Encounter (HOSPITAL_COMMUNITY): Payer: Self-pay | Admitting: Emergency Medicine

## 2012-06-23 ENCOUNTER — Emergency Department (HOSPITAL_COMMUNITY)
Admission: EM | Admit: 2012-06-23 | Discharge: 2012-06-23 | Disposition: A | Discharge status: Home / Self Care | Payer: Medicaid Other | Source: Home / Self Care | Attending: Family Medicine | Admitting: Family Medicine

## 2012-06-23 DIAGNOSIS — B370 Candidal stomatitis: Secondary | ICD-10-CM

## 2012-06-23 MED ORDER — NYSTATIN 100000 UNIT/ML MT SUSP: 500000.0000 [IU] | Freq: Four times a day (QID) | OROMUCOSAL | Status: DC

## 2012-06-23 NOTE — ED Notes (Signed)
Pt c/o poss thrush in mouth onset 3 weeks Sx include white patches inside mouth Denies: f/v/n/d, pain States she was taking qvar on a daily basis and believes it may have come from that; switched to symbicort now  She is alert and oriented w/no signs of acute distress.

## 2012-06-24 ENCOUNTER — Encounter (HOSPITAL_COMMUNITY): Payer: Self-pay

## 2012-06-24 NOTE — ED Provider Notes (Signed)
History     CSN: [id number]  Arrival date & time 06/23/12  1226   First MD Initiated Contact with Patient 06/23/12 1254      Chief Complaint  Patient presents with  . Thrush    (Consider location/radiation/quality/duration/timing/severity/associated sxs/prior treatment) HPI Comments: 22 y/o female [redacted] weeks pregnant also with h/o asthma. Here c/o white patches in her inner lips and soft palate for 3 weeks. She uses steroid inhalers on daily bases. Denies current asthma symptoms. Denies fever or chills appetite is ok, no pain with eating or swallowing. No similar symptoms in the past. States "everything going well with pregnancy"   Past Medical History  Diagnosis Date  . Asthma   . Depression     Past Surgical History  Procedure Laterality Date  . Cesarean section    . Wisdom tooth extraction      Family History  Problem Relation Age of Onset  . Other Neg Hx   . Asthma Mother   . Arthritis Mother   . Diabetes Mother   . Hypertension Mother   . Hypertension Father   . Asthma Sister   . Allergies Mother   . Allergies Son   . Asthma Son     History  Substance Use Topics  . Smoking status: Former Smoker -- 0.50 packs/day for 6 years    Types: Cigarettes    Quit date: 11/04/2011  . Smokeless tobacco: Never Used  . Alcohol Use: No    OB History   Grav Para Term Preterm Abortions TAB SAB Ect Mult Living   2 1 1  0 0 0 0 0 0 1      Review of Systems  Constitutional: Negative for fever, chills and appetite change.  HENT: Negative for sore throat and trouble swallowing.   Respiratory: Negative for cough, shortness of breath and wheezing.   Gastrointestinal: Negative for nausea, vomiting, abdominal pain and diarrhea.  Neurological: Negative for headaches.  All other systems reviewed and are negative.    Allergies  Review of patient's allergies indicates no known allergies.  Home Medications   Current Outpatient Rx  Name  Route  Sig  Dispense  Refill  .  albuterol (PROAIR HFA) 108 (90 BASE) MCG/ACT inhaler   Inhalation   Inhale 2 puffs into the lungs every 6 (six) hours as needed for wheezing or shortness of breath.         . budesonide-formoterol (SYMBICORT) 160-4.5 MCG/ACT inhaler      Take 2 puffs first thing in am and then another 2 puffs about 12 hours later.   1 Inhaler      . Cetirizine HCl 10 MG CAPS   Oral   Take 1 capsule (10 mg total) by mouth daily.   30 capsule   2   . nystatin (MYCOSTATIN) 100000 UNIT/ML suspension   Oral   Take 5 mLs (500,000 Units total) by mouth 4 (four) times daily.   60 mL   0   . Prenatal Vit-Fe Fumarate-FA (PRENATAL MULTIVITAMIN) TABS   Oral   Take 1 tablet by mouth daily.         . pseudoephedrine (SUDAFED) 60 MG tablet   Oral   Take 1 tablet (60 mg total) by mouth every 4 (four) hours as needed for congestion.   30 tablet   0     BP 114/74  Pulse 109  Temp(Src) 98.2 F (36.8 C) (Oral)  Resp 16  SpO2 98%  LMP 09/25/2011  Physical Exam  Nursing note and vitals reviewed. Constitutional: She is oriented to person, place, and time. She appears well-developed and well-nourished. No distress.  HENT:  Head: Normocephalic and atraumatic.  There are white spots in inner lips and soft palate also involving the uvula. No ulcers. Moist mucus membranes.   Eyes: Conjunctivae are normal. No scleral icterus.  Neck: Neck supple.  Cardiovascular: Normal heart sounds.   Pulmonary/Chest: Breath sounds normal.  Abdominal:  pregnant  Lymphadenopathy:    She has no cervical adenopathy.  Neurological: She is alert and oriented to person, place, and time.  Skin: She is not diaphoretic.    ED Course  Procedures (including critical care time)  Labs Reviewed - No data to display No results found.   1. Oral thrush       MDM  Instructed to rinse mouth after steroid inhalers. Prescribed nystatin suspension. Supportive care and red flags that should prompt her return to medical  attention discussed with patient and provided in writing.        Sharin Grave, MD 06/24/12 [phone]

## 2012-06-28 ENCOUNTER — Encounter (HOSPITAL_COMMUNITY): Payer: Self-pay

## 2012-06-28 ENCOUNTER — Encounter (HOSPITAL_COMMUNITY)
Admission: RE | Admit: 2012-06-28 | Discharge: 2012-06-28 | Disposition: A | Discharge status: Home / Self Care | Payer: Medicaid Other | Source: Ambulatory Visit | Attending: Obstetrics and Gynecology | Admitting: Obstetrics and Gynecology

## 2012-06-28 ENCOUNTER — Other Ambulatory Visit: Payer: Self-pay | Admitting: Obstetrics and Gynecology

## 2012-06-28 HISTORY — DX: Anemia, unspecified: D64.9

## 2012-06-28 HISTORY — DX: Gastro-esophageal reflux disease without esophagitis: K21.9

## 2012-06-28 LAB — TYPE AND SCREEN: Antibody Screen: NEGATIVE

## 2012-06-28 LAB — CBC
HCT: 33.1 % — ABNORMAL LOW (ref 36.0–46.0)
Hemoglobin: 10.7 g/dL — ABNORMAL LOW (ref 12.0–15.0)
MCH: 24.8 pg — ABNORMAL LOW (ref 26.0–34.0)
MCV: 76.6 fL — ABNORMAL LOW (ref 78.0–100.0)
Platelets: 239 10*3/uL (ref 150–400)
RBC: 4.32 MIL/uL (ref 3.87–5.11)
WBC: 9.4 10*3/uL (ref 4.0–10.5)

## 2012-06-28 NOTE — Patient Instructions (Addendum)
Your procedure is scheduled on:06/29/12  Enter through the Main Entrance at :0815 am Pick up desk phone and dial [phone] and inform us of your arrival.  Please call [phone] if you have any problems the morning of surgery.  Remember: Do not eat or drink after midnight: tonight   Take these meds the morning of surgery with a sip of water:Symbicort. Bring inhaler to hospital.  DO NOT wear jewelry, eye make-up, lipstick,body lotion, or dark fingernail polish.   If you are to be admitted after surgery, leave suitcase in car until your room has been assigned. Patients discharged on the day of surgery will not be allowed to drive home.

## 2012-06-28 NOTE — H&P (Signed)
[name], [name] NO.:  [id number]  MEDICAL RECORD NO.:  [id number]  LOCATION:  [facility]                           FACILITY:  [facility]  PHYSICIAN:  Malachi Pro. Ambrose Mantle, M.D. DATE OF BIRTH:  Sep 21, 1990  DATE OF ADMISSION:  06/28/2012 DATE OF DISCHARGE:                             HISTORY & PHYSICAL   HISTORY OF PRESENT ILLNESS:  This is a 22 year old black female, para 1- 0-0-1, gravida 2, EDC July 03, 2012 by an ultrasound done on December 01, 2011, at 9 weeks and 2 days.  The patient is admitted for repeat C- section.  Initially, she wanted a vaginal birth after cesarean, but has changed her mind.  Her blood group and type is B positive.  Negative antibody.  Pap smear normal.  Rubella immune.  RPR nonreactive.  Urine culture negative.  Hepatitis B surface antigen negative.  HIV negative. Hemoglobin electrophoresis AA.  GC and Chlamydia negative.  Cystic fibrosis negative.  First trimester screen negative.  AFP negative.  One hour Glucola 90.  Group B strep positive.  The patient began her prenatal course at our office on December 07, 2011.  Prenatal course was essentially uncomplicated.  She had a prior C-section and wanted to have a vaginal birth after cesarean, however at her last office visit, she decided that she wanted to abandon the vaginal birth and go for repeat C- section.  The patient had a history of anxiety and depression.  She also had a history of asthma, Chlamydia and gonorrhea.  PAST SURGICAL HISTORY:  C-section in 2010, wisdom teeth extraction.  MEDICATIONS:  She initially was on Ventolin inhaler but recently she was seen by Dr. Sherene Sires from Pulmonology and he switched her to Symbicort.  She also took Zoloft 50 mg early in pregnancy but has discontinued that and wants to resume it postpartum.  She is also taking nystatin for oral thrush.  DRUG ALLERGIES:  No known drug allergies.  No latex allergy.  FAMILY HISTORY:  Mother and father have hypertension.   Mother has diabetes.  Maternal grandmother with diabetes, breast cancer and an aunt has breast cancer.  OBSTETRIC HISTORY:  The patient delivered a 7 pounds, 13 ounce female by C- section in [place] because of late deceleration of the fetal heart rate. The peritoneum was not closed, but the patient had a two-layer closure of the uterus.  SOCIAL HISTORY:  She denies illicit drug use.  She was a former smoker and formally drank alcohol, claims to be in college and is a team member at __________.  PHYSICAL EXAMINATION:  GENERAL:  Well-developed well-nourished black female, in no distress.  She reports good fetal movement. VITAL SIGNS:  Blood pressure 108/70, pulse is 100, weight is 183 pounds. HEART:  Normal size and sounds.  No murmurs. LUNGS:  Clear to auscultation. ABDOMEN:  Soft.  Fundal height 39 cm.  Fetal heart tones normal.  The cervix is 1 cm, 20% vertex at a -3 station.  ADMITTING IMPRESSION:  Intrauterine pregnancy at 39+ weeks gestation, prior C-section, declines VBAC.  The patient is admitted for repeat C- section.  She does not desire tubal ligation.  Risks of surgery have been discussed  and she is ready to proceed.     Malachi Pro. Ambrose Mantle, M.D.     TFH/MEDQ  D:  06/28/2012  T:  06/28/2012  Job:  [id number]

## 2012-06-29 ENCOUNTER — Inpatient Hospital Stay (HOSPITAL_COMMUNITY)
Admission: RE | Admit: 2012-06-29 | Discharge: 2012-07-02 | DRG: 766 | Disposition: A | Discharge status: Home / Self Care | Payer: Medicaid Other | Source: Ambulatory Visit | Attending: Obstetrics and Gynecology | Admitting: Obstetrics and Gynecology

## 2012-06-29 ENCOUNTER — Inpatient Hospital Stay (HOSPITAL_COMMUNITY): Payer: Medicaid Other | Admitting: Anesthesiology

## 2012-06-29 ENCOUNTER — Encounter (HOSPITAL_COMMUNITY)
Admission: RE | Disposition: A | Discharge status: Home / Self Care | Payer: Self-pay | Source: Ambulatory Visit | Attending: Obstetrics and Gynecology

## 2012-06-29 ENCOUNTER — Encounter (HOSPITAL_COMMUNITY): Payer: Self-pay | Admitting: Anesthesiology

## 2012-06-29 ENCOUNTER — Encounter (HOSPITAL_COMMUNITY): Payer: Self-pay | Admitting: *Deleted

## 2012-06-29 DIAGNOSIS — O34219 Maternal care for unspecified type scar from previous cesarean delivery: Principal | ICD-10-CM | POA: Diagnosis present

## 2012-06-29 DIAGNOSIS — Z98891 History of uterine scar from previous surgery: Secondary | ICD-10-CM

## 2012-06-29 LAB — COMPREHENSIVE METABOLIC PANEL
ALT: 14 U/L (ref 0–35)
Albumin: 2.9 g/dL — ABNORMAL LOW (ref 3.5–5.2)
Alkaline Phosphatase: 117 U/L (ref 39–117)
BUN: 3 mg/dL — ABNORMAL LOW (ref 6–23)
Calcium: 9.2 mg/dL (ref 8.4–10.5)
Potassium: 3.5 mEq/L (ref 3.5–5.1)
Sodium: 137 mEq/L (ref 135–145)
Total Protein: 6.2 g/dL (ref 6.0–8.3)

## 2012-06-29 LAB — URINALYSIS, ROUTINE W REFLEX MICROSCOPIC
Glucose, UA: NEGATIVE mg/dL
Ketones, ur: 15 mg/dL — AB
Nitrite: NEGATIVE
Specific Gravity, Urine: 1.025 (ref 1.005–1.030)
pH: 7 (ref 5.0–8.0)

## 2012-06-29 LAB — URINE MICROSCOPIC-ADD ON

## 2012-06-29 SURGERY — Surgical Case
Anesthesia: Spinal | Site: Abdomen

## 2012-06-29 MED ORDER — NYSTATIN 100000 UNIT/ML MT SUSP
500000.0000 [IU] | Freq: Four times a day (QID) | OROMUCOSAL | Status: DC
  Administered 2012-06-29 – 2012-07-01 (×8): 500000 [IU] via ORAL
  Filled 2012-06-29 (×12): qty 5

## 2012-06-29 MED ORDER — KETOROLAC TROMETHAMINE 30 MG/ML IJ SOLN
30.0000 mg | Freq: Four times a day (QID) | INTRAMUSCULAR | Status: AC | PRN
  Administered 2012-06-29: 30 mg via INTRAVENOUS
  Filled 2012-06-29: qty 1

## 2012-06-29 MED ORDER — FENTANYL CITRATE 0.05 MG/ML IJ SOLN
INTRAMUSCULAR | Status: DC | PRN
  Administered 2012-06-29: 15 ug via INTRATHECAL

## 2012-06-29 MED ORDER — SCOPOLAMINE 1 MG/3DAYS TD PT72: 1.0000 | MEDICATED_PATCH | Freq: Once | TRANSDERMAL | Status: DC

## 2012-06-29 MED ORDER — FAMOTIDINE 20 MG PO TABS: 10.0000 mg | ORAL_TABLET | Freq: Every day | ORAL | Status: DC | PRN

## 2012-06-29 MED ORDER — ONDANSETRON HCL 4 MG/2ML IJ SOLN
INTRAMUSCULAR | Status: AC
  Filled 2012-06-29: qty 2

## 2012-06-29 MED ORDER — WITCH HAZEL-GLYCERIN EX PADS: 1.0000 "application " | MEDICATED_PAD | CUTANEOUS | Status: DC | PRN

## 2012-06-29 MED ORDER — FENTANYL CITRATE 0.05 MG/ML IJ SOLN
INTRAMUSCULAR | Status: DC | PRN
  Administered 2012-06-29 (×2): 25 ug via INTRAVENOUS

## 2012-06-29 MED ORDER — CEFAZOLIN SODIUM-DEXTROSE 2-3 GM-% IV SOLR
INTRAVENOUS | Status: AC
  Administered 2012-06-29: 2 g via INTRAVENOUS
  Filled 2012-06-29: qty 50

## 2012-06-29 MED ORDER — FENTANYL CITRATE 0.05 MG/ML IJ SOLN
INTRAMUSCULAR | Status: AC
  Filled 2012-06-29: qty 2

## 2012-06-29 MED ORDER — MEPERIDINE HCL 25 MG/ML IJ SOLN
INTRAMUSCULAR | Status: AC
  Administered 2012-06-29: 6.25 mg via INTRAVENOUS
  Filled 2012-06-29: qty 1

## 2012-06-29 MED ORDER — CEFAZOLIN SODIUM-DEXTROSE 2-3 GM-% IV SOLR
2.0000 g | INTRAVENOUS | Status: AC
Start: 2012-06-29 — End: 2012-06-29
  Administered 2012-06-29: 2 g via INTRAVENOUS

## 2012-06-29 MED ORDER — LANOLIN HYDROUS EX OINT: 1.0000 "application " | TOPICAL_OINTMENT | CUTANEOUS | Status: DC | PRN

## 2012-06-29 MED ORDER — TETANUS-DIPHTH-ACELL PERTUSSIS 5-2.5-18.5 LF-MCG/0.5 IM SUSP: 0.5000 mL | Freq: Once | INTRAMUSCULAR | Status: DC

## 2012-06-29 MED ORDER — DIPHENHYDRAMINE HCL 25 MG PO CAPS: 25.0000 mg | ORAL_CAPSULE | Freq: Four times a day (QID) | ORAL | Status: DC | PRN

## 2012-06-29 MED ORDER — CEFAZOLIN SODIUM 1-5 GM-% IV SOLN
1.0000 g | Freq: Three times a day (TID) | INTRAVENOUS | Status: AC
  Administered 2012-06-29 (×2): 1 g via INTRAVENOUS
  Filled 2012-06-29 (×2): qty 50

## 2012-06-29 MED ORDER — IBUPROFEN 600 MG PO TABS
600.0000 mg | ORAL_TABLET | Freq: Four times a day (QID) | ORAL | Status: DC
  Administered 2012-06-29 – 2012-07-02 (×10): 600 mg via ORAL
  Filled 2012-06-29 (×10): qty 1

## 2012-06-29 MED ORDER — PHENYLEPHRINE HCL 10 MG/ML IJ SOLN
INTRAMUSCULAR | Status: DC | PRN
  Administered 2012-06-29: 80 ug via INTRAVENOUS
  Administered 2012-06-29: 40 ug via INTRAVENOUS
  Administered 2012-06-29: 80 ug via INTRAVENOUS

## 2012-06-29 MED ORDER — ONDANSETRON HCL 4 MG/2ML IJ SOLN
INTRAMUSCULAR | Status: DC | PRN
  Administered 2012-06-29: 4 mg via INTRAVENOUS

## 2012-06-29 MED ORDER — SIMETHICONE 80 MG PO CHEW: 80.0000 mg | CHEWABLE_TABLET | ORAL | Status: DC | PRN

## 2012-06-29 MED ORDER — ACETAMINOPHEN 10 MG/ML IV SOLN: 1000.0000 mg | Freq: Four times a day (QID) | INTRAVENOUS | Status: AC | PRN

## 2012-06-29 MED ORDER — NALBUPHINE HCL 10 MG/ML IJ SOLN: 5.0000 mg | INTRAMUSCULAR | Status: DC | PRN

## 2012-06-29 MED ORDER — LACTATED RINGERS IV SOLN
INTRAVENOUS | Status: DC
  Administered 2012-06-29 (×3): via INTRAVENOUS

## 2012-06-29 MED ORDER — NALOXONE HCL 0.4 MG/ML IJ SOLN: 0.4000 mg | INTRAMUSCULAR | Status: DC | PRN

## 2012-06-29 MED ORDER — SENNOSIDES-DOCUSATE SODIUM 8.6-50 MG PO TABS
2.0000 | ORAL_TABLET | Freq: Every day | ORAL | Status: DC
  Administered 2012-06-29 – 2012-07-01 (×3): 2 via ORAL

## 2012-06-29 MED ORDER — DIPHENHYDRAMINE HCL 50 MG/ML IJ SOLN: 12.5000 mg | INTRAMUSCULAR | Status: DC | PRN

## 2012-06-29 MED ORDER — SERTRALINE HCL 25 MG PO TABS
25.0000 mg | ORAL_TABLET | Freq: Every day | ORAL | Status: AC
  Administered 2012-06-29 – 2012-07-01 (×3): 25 mg via ORAL
  Filled 2012-06-29 (×3): qty 1

## 2012-06-29 MED ORDER — FAMOTIDINE 10 MG PO CHEW: 10.0000 mg | CHEWABLE_TABLET | Freq: Every day | ORAL | Status: DC | PRN

## 2012-06-29 MED ORDER — OXYTOCIN 10 UNIT/ML IJ SOLN
INTRAMUSCULAR | Status: DC | PRN
  Administered 2012-06-29: 40 [IU] via INTRAMUSCULAR

## 2012-06-29 MED ORDER — NALBUPHINE HCL 10 MG/ML IJ SOLN
5.0000 mg | INTRAMUSCULAR | Status: DC | PRN
  Administered 2012-06-29: 10 mg via INTRAVENOUS
  Filled 2012-06-29: qty 1

## 2012-06-29 MED ORDER — MORPHINE SULFATE 0.5 MG/ML IJ SOLN
INTRAMUSCULAR | Status: AC
  Filled 2012-06-29: qty 10

## 2012-06-29 MED ORDER — DIPHENHYDRAMINE HCL 25 MG PO CAPS: 25.0000 mg | ORAL_CAPSULE | ORAL | Status: DC | PRN

## 2012-06-29 MED ORDER — METOCLOPRAMIDE HCL 5 MG/ML IJ SOLN: 10.0000 mg | Freq: Three times a day (TID) | INTRAMUSCULAR | Status: DC | PRN

## 2012-06-29 MED ORDER — DIPHENHYDRAMINE HCL 50 MG/ML IJ SOLN: 25.0000 mg | INTRAMUSCULAR | Status: DC | PRN

## 2012-06-29 MED ORDER — ONDANSETRON HCL 4 MG PO TABS: 4.0000 mg | ORAL_TABLET | ORAL | Status: DC | PRN

## 2012-06-29 MED ORDER — MORPHINE SULFATE (PF) 0.5 MG/ML IJ SOLN
INTRAMUSCULAR | Status: DC | PRN
  Administered 2012-06-29: .1 mg via INTRATHECAL

## 2012-06-29 MED ORDER — KETOROLAC TROMETHAMINE 30 MG/ML IJ SOLN: 30.0000 mg | Freq: Four times a day (QID) | INTRAMUSCULAR | Status: AC | PRN

## 2012-06-29 MED ORDER — OXYTOCIN 40 UNITS IN LACTATED RINGERS INFUSION - SIMPLE MED: 62.5000 mL/h | INTRAVENOUS | Status: AC

## 2012-06-29 MED ORDER — SODIUM CHLORIDE 0.9 % IJ SOLN: 3.0000 mL | INTRAMUSCULAR | Status: DC | PRN

## 2012-06-29 MED ORDER — BUPIVACAINE IN DEXTROSE 0.75-8.25 % IT SOLN
INTRATHECAL | Status: DC | PRN
  Administered 2012-06-29: 1.5 mL via INTRATHECAL

## 2012-06-29 MED ORDER — BUDESONIDE-FORMOTEROL FUMARATE 160-4.5 MCG/ACT IN AERO
2.0000 | INHALATION_SPRAY | Freq: Every morning | RESPIRATORY_TRACT | Status: DC
  Administered 2012-06-30: 2 via RESPIRATORY_TRACT
  Filled 2012-06-29: qty 6

## 2012-06-29 MED ORDER — HYDROMORPHONE HCL PF 1 MG/ML IJ SOLN
INTRAMUSCULAR | Status: AC
  Administered 2012-06-29: 0.5 mg via INTRAVENOUS
  Filled 2012-06-29: qty 1

## 2012-06-29 MED ORDER — MENTHOL 3 MG MT LOZG: 1.0000 | LOZENGE | OROMUCOSAL | Status: DC | PRN

## 2012-06-29 MED ORDER — ZOLPIDEM TARTRATE 5 MG PO TABS: 5.0000 mg | ORAL_TABLET | Freq: Every evening | ORAL | Status: DC | PRN

## 2012-06-29 MED ORDER — SIMETHICONE 80 MG PO CHEW
80.0000 mg | CHEWABLE_TABLET | Freq: Three times a day (TID) | ORAL | Status: DC
  Administered 2012-06-29 – 2012-07-02 (×8): 80 mg via ORAL

## 2012-06-29 MED ORDER — MEPERIDINE HCL 25 MG/ML IJ SOLN: 6.2500 mg | INTRAMUSCULAR | Status: DC | PRN

## 2012-06-29 MED ORDER — SCOPOLAMINE 1 MG/3DAYS TD PT72
1.0000 | MEDICATED_PATCH | Freq: Once | TRANSDERMAL | Status: DC
  Filled 2012-06-29: qty 1

## 2012-06-29 MED ORDER — LACTATED RINGERS IV SOLN
INTRAVENOUS | Status: DC
  Administered 2012-06-29 (×2): via INTRAVENOUS

## 2012-06-29 MED ORDER — KETOROLAC TROMETHAMINE 30 MG/ML IJ SOLN
INTRAMUSCULAR | Status: AC
  Administered 2012-06-29: 30 mg via INTRAVENOUS
  Filled 2012-06-29: qty 1

## 2012-06-29 MED ORDER — MEASLES, MUMPS & RUBELLA VAC ~~LOC~~ INJ
0.5000 mL | INJECTION | Freq: Once | SUBCUTANEOUS | Status: DC
  Filled 2012-06-29: qty 0.5

## 2012-06-29 MED ORDER — KETOROLAC TROMETHAMINE 60 MG/2ML IM SOLN
60.0000 mg | Freq: Once | INTRAMUSCULAR | Status: AC | PRN
  Filled 2012-06-29: qty 2

## 2012-06-29 MED ORDER — OXYCODONE-ACETAMINOPHEN 5-325 MG PO TABS
1.0000 | ORAL_TABLET | ORAL | Status: DC | PRN
  Administered 2012-06-29 – 2012-06-30 (×5): 1 via ORAL
  Administered 2012-06-30 – 2012-07-01 (×3): 2 via ORAL
  Administered 2012-07-01 – 2012-07-02 (×6): 1 via ORAL
  Administered 2012-07-02: 2 via ORAL
  Filled 2012-06-29: qty 2
  Filled 2012-06-29 (×3): qty 1
  Filled 2012-06-29: qty 2
  Filled 2012-06-29 (×4): qty 1
  Filled 2012-06-29: qty 2
  Filled 2012-06-29 (×2): qty 1
  Filled 2012-06-29: qty 2
  Filled 2012-06-29 (×2): qty 1
  Filled 2012-06-29: qty 2

## 2012-06-29 MED ORDER — DIBUCAINE 1 % RE OINT: 1.0000 "application " | TOPICAL_OINTMENT | RECTAL | Status: DC | PRN

## 2012-06-29 MED ORDER — HYDROMORPHONE HCL PF 1 MG/ML IJ SOLN
0.2500 mg | INTRAMUSCULAR | Status: DC | PRN
  Administered 2012-06-29: 0.5 mg via INTRAVENOUS

## 2012-06-29 MED ORDER — NALOXONE HCL 1 MG/ML IJ SOLN: 1.0000 ug/kg/h | INTRAVENOUS | Status: DC | PRN

## 2012-06-29 MED ORDER — PRENATAL MULTIVITAMIN CH
1.0000 | ORAL_TABLET | Freq: Every day | ORAL | Status: DC
  Administered 2012-06-30 – 2012-07-01 (×2): 1 via ORAL
  Filled 2012-06-29 (×2): qty 1

## 2012-06-29 MED ORDER — SCOPOLAMINE 1 MG/3DAYS TD PT72
MEDICATED_PATCH | TRANSDERMAL | Status: AC
  Administered 2012-06-29: 1.5 mg via TRANSDERMAL
  Filled 2012-06-29: qty 1

## 2012-06-29 MED ORDER — ONDANSETRON HCL 4 MG/2ML IJ SOLN: 4.0000 mg | INTRAMUSCULAR | Status: DC | PRN

## 2012-06-29 MED ORDER — OXYTOCIN 10 UNIT/ML IJ SOLN
INTRAMUSCULAR | Status: AC
  Filled 2012-06-29: qty 4

## 2012-06-29 MED ORDER — ONDANSETRON HCL 4 MG/2ML IJ SOLN: 4.0000 mg | Freq: Three times a day (TID) | INTRAMUSCULAR | Status: DC | PRN

## 2012-06-29 SURGICAL SUPPLY — 32 items
CLAMP CORD UMBIL (MISCELLANEOUS) ×2 IMPLANT
CLOTH BEACON ORANGE TIMEOUT ST (SAFETY) ×2 IMPLANT
CONTAINER PREFILL 10% NBF 15ML (MISCELLANEOUS) IMPLANT
DRAPE LG THREE QUARTER DISP (DRAPES) ×2 IMPLANT
DRSG OPSITE POSTOP 4X10 (GAUZE/BANDAGES/DRESSINGS) ×2 IMPLANT
DRSG VASELINE 3X18 (GAUZE/BANDAGES/DRESSINGS) ×2 IMPLANT
DURAPREP 26ML APPLICATOR (WOUND CARE) ×2 IMPLANT
ELECT REM PT RETURN 9FT ADLT (ELECTROSURGICAL) ×2
ELECTRODE REM PT RTRN 9FT ADLT (ELECTROSURGICAL) ×1 IMPLANT
EXTRACTOR VACUUM KIWI (MISCELLANEOUS) IMPLANT
EXTRACTOR VACUUM M CUP 4 TUBE (SUCTIONS) IMPLANT
GLOVE BIO SURGEON STRL SZ7.5 (GLOVE) ×2 IMPLANT
GOWN PREVENTION PLUS XLARGE (GOWN DISPOSABLE) ×2 IMPLANT
GOWN STRL REIN XL XLG (GOWN DISPOSABLE) ×4 IMPLANT
KIT ABG SYR 3ML LUER SLIP (SYRINGE) IMPLANT
NEEDLE HYPO 25X5/8 SAFETYGLIDE (NEEDLE) IMPLANT
NS IRRIG 1000ML POUR BTL (IV SOLUTION) ×2 IMPLANT
PACK C SECTION WH (CUSTOM PROCEDURE TRAY) ×2 IMPLANT
PAD OB MATERNITY 4.3X12.25 (PERSONAL CARE ITEMS) ×2 IMPLANT
RTRCTR C-SECT PINK 25CM LRG (MISCELLANEOUS) ×2 IMPLANT
STAPLER VISISTAT 35W (STAPLE) IMPLANT
STRIP CLOSURE SKIN 1/2X4 (GAUZE/BANDAGES/DRESSINGS) ×4 IMPLANT
SUT PLAIN 0 NONE (SUTURE) IMPLANT
SUT VIC AB 0 CT1 36 (SUTURE) ×16 IMPLANT
SUT VIC AB 3-0 CTX 36 (SUTURE) ×2 IMPLANT
SUT VIC AB 3-0 SH 27 (SUTURE)
SUT VIC AB 3-0 SH 27X BRD (SUTURE) IMPLANT
SUT VIC AB 4-0 KS 27 (SUTURE) ×2 IMPLANT
SUT VICRYL 0 TIES 12 18 (SUTURE) IMPLANT
TOWEL OR 17X24 6PK STRL BLUE (TOWEL DISPOSABLE) ×6 IMPLANT
TRAY FOLEY CATH 14FR (SET/KITS/TRAYS/PACK) ×2 IMPLANT
WATER STERILE IRR 1000ML POUR (IV SOLUTION) ×2 IMPLANT

## 2012-06-29 NOTE — Lactation Note (Signed)
This note was copied from the chart of [facility] [employer]. Lactation Consultation Note: Called to mom's room. Concerned that baby has not fed for a while but that she just tried and baby is too sleepy. Reassurance given that babies are sleepy the first 24 hours. Reports that baby latched well and nursed for 15 minutes earlier today. Mom sleepy at present and grandmother holding baby. No questions at present. Encouraged to call for assist prn  Patient Name: [name] [id number] Date: 06/29/2012 Reason for consult: Initial assessment   Maternal Data   Feeding   LATCH Score/Interventions        Lactation Tools Discussed/Used     Consult Status     Pamelia Hoit 06/29/2012, 2:25 PM

## 2012-06-29 NOTE — Transfer of Care (Signed)
Immediate Anesthesia Transfer of Care Note  Patient: [name]  Procedure(s) Performed: Procedure(s) with comments: CESAREAN SECTION (N/A) - 1 1/2 hrs OR time   Patient Location: PACU  Anesthesia Type:Spinal  Level of Consciousness: awake  Airway & Oxygen Therapy: Patient Spontanous Breathing  Post-op Assessment: Report given to PACU RN  Post vital signs: Reviewed and stable  Complications: No apparent anesthesia complications

## 2012-06-29 NOTE — Anesthesia Procedure Notes (Signed)
Spinal  Patient location during procedure: OR Start time: 06/29/2012 10:01 AM Staffing Performed by: anesthesiologist  Preanesthetic Checklist Completed: patient identified, site marked, surgical consent, pre-op evaluation, timeout performed, IV checked, risks and benefits discussed and monitors and equipment checked Spinal Block Patient position: sitting Prep: site prepped and draped and DuraPrep Patient monitoring: heart rate, cardiac monitor, continuous pulse ox and blood pressure Approach: midline Location: L3-4 Injection technique: single-shot Needle Needle type: Sprotte  Needle gauge: 24 G Needle length: 9 cm Assessment Sensory level: T4 Additional Notes Clear free flow CSF on second attempt.  No paresthesia.  Patient tolerated procedure well with no apparent complications.  Jasmine December, MD

## 2012-06-29 NOTE — Anesthesia Postprocedure Evaluation (Signed)
  Anesthesia Post-op Note  Patient: [name]  Procedure(s) Performed: Procedure(s) with comments: CESAREAN SECTION (N/A) - 1 1/2 hrs OR time   Patient Location: PACU and Mother/Baby  Anesthesia Type:Spinal  Level of Consciousness: awake, alert , oriented and patient cooperative  Airway and Oxygen Therapy: Patient Spontanous Breathing  Post-op Pain: none  Post-op Assessment: Post-op Vital signs reviewed, Patient's Cardiovascular Status Stable and Respiratory Function Stable  Post-op Vital Signs: Reviewed and stable  Complications: No apparent anesthesia complications
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
## 2012-06-29 NOTE — Anesthesia Postprocedure Evaluation (Signed)
  Anesthesia Post-op Note  Anesthesia Post Note  Patient: [name]  Procedure(s) Performed: Procedure(s) (LRB): CESAREAN SECTION (N/A)  Anesthesia type: Spinal  Patient location: PACU  Post pain: Pain level controlled  Post assessment: Post-op Vital signs reviewed  Last Vitals:  Filed Vitals:   06/29/12 1230  BP: 120/72  Pulse: 69  Temp: 37.1 C  Resp: 17    Post vital signs: Reviewed  Level of consciousness: awake  Complications: No apparent anesthesia complications

## 2012-06-29 NOTE — Anesthesia Preprocedure Evaluation (Signed)
Anesthesia Evaluation  Patient identified by MRN, date of birth, ID band Patient awake    Reviewed: Allergy & Precautions, H&P , Patient's Chart, lab work & pertinent test results  Airway Mallampati: II TM Distance: >3 FB Neck ROM: full    Dental no notable dental hx.    Pulmonary asthma ,  breath sounds clear to auscultation  Pulmonary exam normal       Cardiovascular Exercise Tolerance: Good Rhythm:regular Rate:Normal     Neuro/Psych    GI/Hepatic GERD-  Medicated,  Endo/Other    Renal/GU      Musculoskeletal   Abdominal   Peds  Hematology  (+) anemia ,   Anesthesia Other Findings   Reproductive/Obstetrics                           Anesthesia Physical Anesthesia Plan  ASA: II  Anesthesia Plan: Spinal   Post-op Pain Management:    Induction:   Airway Management Planned:   Additional Equipment:   Intra-op Plan:   Post-operative Plan:   Informed Consent: I have reviewed the patients History and Physical, chart, labs and discussed the procedure including the risks, benefits and alternatives for the proposed anesthesia with the patient or authorized representative who has indicated his/her understanding and acceptance.   Dental Advisory Given  Plan Discussed with: CRNA  Anesthesia Plan Comments: (Lab work confirmed with CRNA in room. Platelets okay. Discussed spinal anesthetic, and patient consents to the procedure:  included risk of possible headache,backache, failed block, allergic reaction, and nerve injury. This patient was asked if she had any questions or concerns before the procedure started. )        Anesthesia Quick Evaluation

## 2012-06-29 NOTE — Op Note (Signed)
Operative note on [name]  Date of the operation 06/29/2012  Preoperative diagnosis: Intrauterine pregnancy 39+ weeks, prior C-section, declined VBAC  Postoperative diagnosis: Same  Operation: Low transverse cervical C-section  Operator: Ambrose Mantle  Assistant: Bovard   Anesthesia: Spinal Dr. Rodman Pickle  The patient was brought to the operating room, given spinal anesthetic by Dr. Rodman Pickle, and placed supine tilted to the left. The urethra was prepped with Betadine solution and a Foley catheter was inserted. The abdomen was prepped with DuraPrep. A timeout was done in 3 minutes were allowed to elapse before draping the abdomen. The old incision was quite thin but I tried to make the incision through the old scar. The incision was carried in layers through the skin subcutaneous tissue and fascia and the fascia was separated from the rectus muscles superiorly and inferiorly. The peritoneum was opened and a flexible retractor was inserted to expose the lower uterine segment. There were no adhesions. A 2 inch transverse incision was made through the superficial layers of the myometrium in the lower uterine segment and I entered the amniotic sac with my finger spilling clear fluid. The incision was enlarged by pulling superiorly and inferiorly and the vertex was delivered through the incisional opening without difficulty. The remainder of the body was delivered, the cord was clamped, and the infant was given to the neonatologist who was in attendance. The female infant was assigned Apgars of 8 and 9 at one and 5 minutes. The patient was donating her cord blood so I allowed time for the placenta to separate spontaneously however ultimately I had to use some manipulation to get the placenta out. The inside of the uterus was inspected and found to be free of any debris after a few membranes were teased out. The uterine incision was closed in 2 layers using a running lock suture of 0 Vicryl on the first layer and  a nonlocking imbricating suture of 0 Vicryl on the second layer. The tubes and ovaries were inspected and  found to be completely normal. One figure-of-eight suture of 0 Vicryl was used on the uterine incision for complete hemostasis. The gutters were blotted free of blood and vernix, the retractor was removed, and the abdominal wall was closed in layers after complete hemostasis was obtained. 0 Vicryl interrupted sutures were used to close the rectus muscle and peritoneum in one layer, 2 running sutures of 0 Vicryl were used on the fascia, a running 3-0 Vicryl was used on the subcutaneous tissue, and a 4-0 Vicryl suture was used in a subcuticular fashion on the skin at the patient's request. Steri-Strips were applied to the incision and the procedure was terminated. Blood loss was estimated at 600 cc. Sponge and needle counts were correct. The patient was returned to recovery in satisfactory condition.

## 2012-06-29 NOTE — Progress Notes (Signed)
Patient ID: [name], female   DOB: 1990-12-22, 22 y.o.   MRN: [id number] I examined this lady 06-28-12 and she reports no change in her health since that time.

## 2012-06-30 ENCOUNTER — Encounter (HOSPITAL_COMMUNITY): Payer: Self-pay | Admitting: Obstetrics and Gynecology

## 2012-06-30 LAB — CCBB MATERNAL DONOR DRAW

## 2012-06-30 LAB — CBC
HCT: 32.7 % — ABNORMAL LOW (ref 36.0–46.0)
Hemoglobin: 10.6 g/dL — ABNORMAL LOW (ref 12.0–15.0)
MCH: 24.7 pg — ABNORMAL LOW (ref 26.0–34.0)
MCHC: 32.4 g/dL (ref 30.0–36.0)
MCV: 76.2 fL — ABNORMAL LOW (ref 78.0–100.0)

## 2012-06-30 NOTE — Clinical Social Work Maternal (Signed)
    Clinical Social Work Department PSYCHOSOCIAL ASSESSMENT - MATERNAL/CHILD 06/30/2012  Patient:  [name], [name]  Account Number:  [id number]  Admit Date:  06/29/2012  Marjo Bicker Name:   BB Girl Duffy Rhody    Clinical Social Worker:  Nobie Putnam, LCSW   Date/Time:  06/30/2012 03:00 PM  Date Referred:  06/30/2012   Referral source  CN     Referred reason  Depression/Anxiety  Substance Abuse   Other referral source:    I:  FAMILY / HOME ENVIRONMENT Child's legal guardian:  PARENT  Guardian - Name Guardian - Age Guardian - Address  Codee Bloodworth [address]; [place], [place] [address]  Epifania Gore 24 (same as above)   Other household support members/support persons Name Relationship DOB  Curly Rim [facility] 11/01/08   Other support:    II  PSYCHOSOCIAL DATA Information Source:  Patient Interview  Event organiser Employment:   Financial resources:  Medicaid If Medicaid - County:  [place] Other  [employer]  Food Stamps   School / Grade:   Maternity Care Coordinator / Child Services Coordination / Early Interventions:  Cultural issues impacting care:    III  STRENGTHS Strengths  Adequate Resources  Home prepared for Child (including basic supplies)  Supportive family/friends   Strength comment:    IV  RISK FACTORS AND CURRENT PROBLEMS Current Problem:  YES   Risk Factor & Current Problem Patient Issue Family Issue Risk Factor / Current Problem Comment  Mental Illness Y N Hx depression  Substance Abuse Y N Hx of MJ & Ectasy use    V  SOCIAL WORK ASSESSMENT CSW met with pt to assess history of substance use & depression.  Pt admitted to smoking MJ "3 times a week," prior to pregnancy @ 2 months.  She also admits to using Ectasy 1 time in her "whole life," prior to pregnancy.  CSW explained hospital drug testing policy.  Pt seems confident that results will be negative.  UDS is negative, meconium results are pending.  Pt became hesitant to continue  conversation with this CSW when her oldest son was mentioned.  Pt told CSW that her son lives with his paternal grandmother in [place], [place]. She denies previous CPS involvement rather "family drama."  She told CSW that the father of her son never returned him from a visit.  She is currently involved in a custody battle.  She appeared to become emotional & asked "Is this mandatory?"  CSW told pt that she did not have to answer questions & attempted to explained the purpose of the consult, again.  Pt declined to continue speaking with CSW.  FOB was at the bedside & appears supportive.  CSW called [facility] CPS & confirmed that pt does not have previous (substantiated) involvement.  CSW was not able to complete assessment due to pt's reservations.      VI SOCIAL WORK PLAN Social Work Plan  No Further Intervention Required / No Barriers to Discharge   Type of pt/family education:   If child protective services report - county:   If child protective services report - date:   Information/referral to community resources comment:   Other social work plan:

## 2012-06-30 NOTE — Progress Notes (Signed)
Patient ID: [name], female   DOB: 1990/03/31, 22 y.o.   MRN: [id number] #1 afebrile BP normal HGB stable Tolerating a diet and passing flatus Ambulating well

## 2012-06-30 NOTE — Lactation Note (Signed)
This note was copied from the chart of [name]. Lactation Consultation Note Mom states she thinks br feeding is going well but is concerned that baby isn't getting enough, despite being reassured by staff that she is.  Mom holding baby, baby showing feeding cues, offered to assist with latch, mom accepts. Baby latched very well to left side football hold with minimal assistance. Mom easily hand express colostrum.  Reassured mom that baby is feeding very well, reviewed volume needs, cluster feeding, and the importance of her colostrum. Reviewed br feeding basics. Questions answered. Mom states she feels a little better, but her confidence is questionable. Will likely need further reassurance.    Patient Name: [name] Date: 06/30/2012 Reason for consult: Follow-up assessment   Maternal Data Has patient been taught Hand Expression?: Yes  Feeding Feeding Type: Breast Milk Feeding method: Breast  LATCH Score/Interventions Latch: Grasps breast easily, tongue down, lips flanged, rhythmical sucking. Intervention(s): Adjust position;Assist with latch;Breast massage  Audible Swallowing: Spontaneous and intermittent Intervention(s): Skin to skin;Hand expression  Type of Nipple: Everted at rest and after stimulation  Comfort (Breast/Nipple): Soft / non-tender     Hold (Positioning): Assistance needed to correctly position infant at breast and maintain latch. Intervention(s): Breastfeeding basics reviewed;Support Pillows;Position options;Skin to skin  LATCH Score: 9  Lactation Tools Discussed/Used     Consult Status Consult Status: Follow-up Follow-up type: In-patient    Octavio Manns [facility] 06/30/2012, 3:03 PM

## 2012-07-01 MED ORDER — PNEUMOCOCCAL VAC POLYVALENT 25 MCG/0.5ML IJ INJ
0.5000 mL | INJECTION | Freq: Once | INTRAMUSCULAR | Status: AC
  Administered 2012-07-02: 0.5 mL via INTRAMUSCULAR
  Filled 2012-07-01: qty 0.5

## 2012-07-01 NOTE — Progress Notes (Signed)
UR chart review completed.  

## 2012-07-01 NOTE — Progress Notes (Signed)
Patient ID: [name], female   DOB: Jun 09, 1990, 22 y.o.   MRN: [id number] #2 afebrile no problems does not want d/c today

## 2012-07-02 DIAGNOSIS — Z98891 History of uterine scar from previous surgery: Secondary | ICD-10-CM

## 2012-07-02 MED ORDER — OXYCODONE-ACETAMINOPHEN 5-325 MG PO TABS: 1.0000 | ORAL_TABLET | ORAL | Status: DC | PRN

## 2012-07-02 MED ORDER — SERTRALINE HCL 25 MG PO TABS: 25.0000 mg | ORAL_TABLET | Freq: Every day | ORAL | Status: DC

## 2012-07-02 MED ORDER — NAPROXEN SODIUM 550 MG PO TABS: 550.0000 mg | ORAL_TABLET | Freq: Two times a day (BID) | ORAL | Status: DC

## 2012-07-02 NOTE — Progress Notes (Signed)
POD #3 Doing well, ready to go home Afeb, VSS Abd- soft, fundus firm, incision intact D/c home 

## 2012-07-02 NOTE — Lactation Note (Signed)
This note was copied from the chart of Madison [employer]. Lactation Consultation Note  Patient Name: [name] [id number] Date: 07/02/2012 Reason for consult: Follow-up assessment   Maternal Data    Feeding   LATCH Score/Interventions                      Lactation Tools Discussed/Used     Consult Status Consult Status: Complete  Mom asking about pump rental. Questions answered. Mom reports that baby is nursing well but she is pumping some to relieve engorgement. Mom reports that she has WIC and only needs a pump for a few days. [facility] loaner completed. Reviewed pumping for comfort. No further questions at present. To call prn  Pamelia Hoit 07/02/2012, 11:30 AM

## 2012-07-02 NOTE — Discharge Summary (Signed)
Obstetric Discharge Summary Reason for Admission: cesarean section Prenatal Procedures: none Intrapartum Procedures: cesarean: low cervical, transverse Postpartum Procedures: none Complications-Operative and Postpartum: none Hemoglobin  Date Value Range Status  06/30/2012 10.6* 12.0 - 15.0 g/dL Final     HCT  Date Value Range Status  06/30/2012 32.7* 36.0 - 46.0 % Final    Physical Exam:  General: alert Lochia: appropriate Uterine Fundus: firm Incision: healing well  Discharge Diagnoses: Term Pregnancy-delivered  Discharge Information: Date: 07/02/2012 Activity: pelvic rest and no strenuous activity Diet: routine Medications: Percocet and Anaprox DS, Zoloft Condition: stable Instructions: refer to practice specific booklet Discharge to: home Follow-up Information   Follow up with Bing Plume, MD. Schedule an appointment as soon as possible for a visit in 2 weeks. (for incision check)    Contact information:   [address], [address] [place] [address] [phone]       Newborn Data: Live born female  Birth Weight: 6 lb 15.5 oz (3160 g) APGAR: 8, 9  Home with mother.  Jesus Poplin D 07/02/2012, 10:15 AM

## 2012-07-07 ENCOUNTER — Ambulatory Visit: Payer: Medicaid Other | Admitting: Internal Medicine

## 2012-10-20 ENCOUNTER — Ambulatory Visit: Payer: Medicaid Other

## 2012-10-21 ENCOUNTER — Encounter (HOSPITAL_COMMUNITY): Payer: Self-pay | Admitting: Emergency Medicine

## 2012-10-21 ENCOUNTER — Emergency Department (HOSPITAL_COMMUNITY)
Admission: EM | Admit: 2012-10-21 | Discharge: 2012-10-21 | Disposition: A | Discharge status: Home / Self Care | Payer: No Typology Code available for payment source | Attending: Emergency Medicine | Admitting: Emergency Medicine

## 2012-10-21 ENCOUNTER — Ambulatory Visit: Payer: Self-pay | Attending: Internal Medicine

## 2012-10-21 DIAGNOSIS — R52 Pain, unspecified: Secondary | ICD-10-CM | POA: Insufficient documentation

## 2012-10-21 DIAGNOSIS — Z79899 Other long term (current) drug therapy: Secondary | ICD-10-CM | POA: Insufficient documentation

## 2012-10-21 DIAGNOSIS — F329 Major depressive disorder, single episode, unspecified: Secondary | ICD-10-CM | POA: Insufficient documentation

## 2012-10-21 DIAGNOSIS — Z862 Personal history of diseases of the blood and blood-forming organs and certain disorders involving the immune mechanism: Secondary | ICD-10-CM | POA: Insufficient documentation

## 2012-10-21 DIAGNOSIS — Z87891 Personal history of nicotine dependence: Secondary | ICD-10-CM | POA: Insufficient documentation

## 2012-10-21 DIAGNOSIS — J069 Acute upper respiratory infection, unspecified: Secondary | ICD-10-CM

## 2012-10-21 DIAGNOSIS — F3289 Other specified depressive episodes: Secondary | ICD-10-CM | POA: Insufficient documentation

## 2012-10-21 DIAGNOSIS — J45901 Unspecified asthma with (acute) exacerbation: Secondary | ICD-10-CM | POA: Insufficient documentation

## 2012-10-21 DIAGNOSIS — Z8719 Personal history of other diseases of the digestive system: Secondary | ICD-10-CM | POA: Insufficient documentation

## 2012-10-21 MED ORDER — HYDROCODONE-ACETAMINOPHEN 7.5-325 MG/15ML PO SOLN
10.0000 mL | Freq: Once | ORAL | Status: AC
  Administered 2012-10-21: 10 mL via ORAL
  Filled 2012-10-21: qty 15

## 2012-10-21 MED ORDER — PREDNISONE 20 MG PO TABS
60.0000 mg | ORAL_TABLET | Freq: Once | ORAL | Status: AC
  Administered 2012-10-21: 60 mg via ORAL
  Filled 2012-10-21: qty 3

## 2012-10-21 MED ORDER — ALBUTEROL SULFATE (5 MG/ML) 0.5% IN NEBU
5.0000 mg | INHALATION_SOLUTION | Freq: Once | RESPIRATORY_TRACT | Status: AC
  Administered 2012-10-21: 5 mg via RESPIRATORY_TRACT
  Filled 2012-10-21: qty 1

## 2012-10-21 MED ORDER — PREDNISONE 20 MG PO TABS: 40.0000 mg | ORAL_TABLET | Freq: Every day | ORAL | Status: DC

## 2012-10-21 MED ORDER — IPRATROPIUM BROMIDE 0.02 % IN SOLN
0.5000 mg | Freq: Once | RESPIRATORY_TRACT | Status: AC
  Administered 2012-10-21: 0.5 mg via RESPIRATORY_TRACT
  Filled 2012-10-21: qty 2.5

## 2012-10-21 MED ORDER — ALBUTEROL SULFATE HFA 108 (90 BASE) MCG/ACT IN AERS
1.0000 | INHALATION_SPRAY | RESPIRATORY_TRACT | Status: DC | PRN
  Administered 2012-10-21: 2 via RESPIRATORY_TRACT
  Filled 2012-10-21: qty 6.7

## 2012-10-21 MED ORDER — ALBUTEROL SULFATE (5 MG/ML) 0.5% IN NEBU
2.5000 mg | INHALATION_SOLUTION | Freq: Once | RESPIRATORY_TRACT | Status: AC
  Administered 2012-10-21: 2.5 mg via RESPIRATORY_TRACT
  Filled 2012-10-21: qty 0.5

## 2012-10-21 NOTE — ED Provider Notes (Signed)
CSN: [id number]     Arrival date & time 10/21/12  1146 History   First MD Initiated Contact with Patient 10/21/12 1154     Chief Complaint  Patient presents with  . Asthma   (Consider location/radiation/quality/duration/timing/severity/associated sxs/prior Treatment) Patient is a 22 y.o. female presenting with asthma. The history is provided by the patient.  Asthma This is a recurrent problem. The current episode started 2 days ago. The problem occurs constantly. The problem has been gradually worsening. Associated symptoms include shortness of breath. Associated symptoms comments: Sore throat, congestion, body aches.  No fever, n/v/d. The symptoms are aggravated by walking. Nothing relieves the symptoms. Treatments tried: inhaler. The treatment provided no relief.    Past Medical History  Diagnosis Date  . Asthma   . Depression     h/o pp depression after 1st pregnancy  . GERD (gastroesophageal reflux disease)   . Anemia 2010   Past Surgical History  Procedure Laterality Date  . Cesarean section    . Wisdom tooth extraction    . Cesarean section N/A 06/29/2012    Procedure: CESAREAN SECTION;  Surgeon: Bing Plume, MD;  Location: [facility];  Service: Obstetrics;  Laterality: N/A;  1 1/2 hrs OR time    Family History  Problem Relation Age of Onset  . Other Neg Hx   . Asthma Mother   . Arthritis Mother   . Diabetes Mother   . Hypertension Mother   . Hypertension Father   . Asthma Sister   . Allergies Mother   . Allergies Son   . Asthma Son    History  Substance Use Topics  . Smoking status: Former Smoker -- 0.50 packs/day for 6 years    Types: Cigarettes    Quit date: 11/04/2011  . Smokeless tobacco: Never Used  . Alcohol Use: No   OB History   Grav Para Term Preterm Abortions TAB SAB Ect Mult Living   2 2 2  0 0 0 0 0 0 1     Review of Systems  Constitutional: Negative for fever.  Respiratory: Positive for shortness of breath.   All other systems reviewed and  are negative.    Allergies  Review of patient's allergies indicates no known allergies.  Home Medications   Current Outpatient Rx  Name  Route  Sig  Dispense  Refill  . albuterol (PROVENTIL HFA;VENTOLIN HFA) 108 (90 BASE) MCG/ACT inhaler   Inhalation   Inhale 2 puffs into the lungs every 6 (six) hours as needed for wheezing.         . naproxen sodium (ANAPROX DS) 550 MG tablet   Oral   Take 1 tablet (550 mg total) by mouth 2 (two) times daily with a meal.   30 tablet   1   . Prenatal Vit-Fe Fumarate-FA (PRENATAL MULTIVITAMIN) TABS   Oral   Take 1 tablet by mouth daily.         . pseudoephedrine (SUDAFED) 30 MG tablet   Oral   Take 60 mg by mouth every 4 (four) hours as needed for congestion.         . sertraline (ZOLOFT) 25 MG tablet   Oral   Take 1 tablet (25 mg total) by mouth daily.   30 tablet   12    LMP 10/03/2012 Physical Exam  Nursing note and vitals reviewed. Constitutional: She is oriented to person, place, and time. She appears well-developed and well-nourished. No distress.  HENT:  Head: Normocephalic and atraumatic.  Right Ear: Tympanic membrane and ear canal normal.  Left Ear: Tympanic membrane and ear canal normal.  Mouth/Throat: Mucous membranes are normal. Posterior oropharyngeal erythema present. No oropharyngeal exudate, posterior oropharyngeal edema or tonsillar abscesses.  Eyes: Conjunctivae and EOM are normal. Pupils are equal, round, and reactive to light.  Neck: Normal range of motion. Neck supple.  Cardiovascular: Normal rate, regular rhythm and intact distal pulses.   No murmur heard. Pulmonary/Chest: Effort normal. No respiratory distress. She has wheezes. She has no rales.  Abdominal: Soft. She exhibits no distension. There is no tenderness. There is no rebound and no guarding.  Musculoskeletal: Normal range of motion. She exhibits no edema and no tenderness.  Lymphadenopathy:    She has no cervical adenopathy.  Neurological: She  is alert and oriented to person, place, and time.  Skin: Skin is warm and dry. No rash noted. No erythema.  Psychiatric: She has a normal mood and affect. Her behavior is normal.    ED Course  Procedures (including critical care time) Labs Review Labs Reviewed  RAPID STREP SCREEN  CULTURE, GROUP A STREP   Imaging Review No results found.  MDM   1. Asthma exacerbation   2. URI (upper respiratory infection)     Pt with typical asthma exacerbation  Symptoms that started several days ago with URI.  nonproductive cough but inhalers at home not working.  Also c/o of sore throat.  Wheezing on exam.  will give steroids, albuterol/atrovent and recheck.  Rapid strep pending   12:29 PM Strep neg.  Will continue to treat asthma.  12:59 PM Pt improving but still wheezing on exam and given a second treatment  1:56 PM Pt feeling better wheezing now scant.  Will send home with inhaler as she is almost out of albuterol and prednisone  Gwyneth Sprout, MD 10/21/12 1356

## 2012-10-21 NOTE — ED Notes (Signed)
Pt c/o asthma x3 days. Reports symptoms started out as a cold.

## 2012-10-23 LAB — CULTURE, GROUP A STREP

## 2012-11-18 ENCOUNTER — Ambulatory Visit: Payer: Medicaid Other

## 2012-12-01 ENCOUNTER — Encounter: Payer: Self-pay | Admitting: Internal Medicine

## 2012-12-01 ENCOUNTER — Ambulatory Visit: Payer: No Typology Code available for payment source | Attending: Internal Medicine | Admitting: Internal Medicine

## 2012-12-01 VITALS — BP 121/83 | HR 80 | Temp 98.5°F | Resp 18 | Wt 154.8 lb

## 2012-12-01 DIAGNOSIS — J45909 Unspecified asthma, uncomplicated: Secondary | ICD-10-CM

## 2012-12-01 LAB — HEPATITIS PANEL, ACUTE
HCV Ab: NEGATIVE
Hep B C IgM: NONREACTIVE
Hepatitis B Surface Ag: NEGATIVE

## 2012-12-01 LAB — POCT URINE PREGNANCY: Preg Test, Ur: NEGATIVE

## 2012-12-01 LAB — POCT GLYCOSYLATED HEMOGLOBIN (HGB A1C): Hemoglobin A1C: 5

## 2012-12-01 LAB — CBC
Hemoglobin: 13.9 g/dL (ref 12.0–15.0)
MCHC: 33.4 g/dL (ref 30.0–36.0)
Platelets: 275 10*3/uL (ref 150–400)
RBC: 5.21 MIL/uL — ABNORMAL HIGH (ref 3.87–5.11)

## 2012-12-01 LAB — COMPLETE METABOLIC PANEL WITH GFR
CO2: 27 mEq/L (ref 19–32)
Calcium: 9.6 mg/dL (ref 8.4–10.5)
Chloride: 106 mEq/L (ref 96–112)
Creat: 0.75 mg/dL (ref 0.50–1.10)
GFR, Est African American: >89 mL/min
GFR, Est Non African American: >89 mL/min
Glucose, Bld: 90 mg/dL (ref 70–99)
Total Bilirubin: 0.9 mg/dL (ref 0.3–1.2)
Total Protein: 7.7 g/dL (ref 6.0–8.3)

## 2012-12-01 LAB — HIV ANTIBODY (ROUTINE TESTING W REFLEX): HIV: NONREACTIVE

## 2012-12-01 MED ORDER — ALBUTEROL SULFATE HFA 108 (90 BASE) MCG/ACT IN AERS: 2.0000 | INHALATION_SPRAY | Freq: Four times a day (QID) | RESPIRATORY_TRACT | Status: DC | PRN

## 2012-12-01 NOTE — Progress Notes (Unsigned)
Patient here to establish care Has a history of asthma Needs refill on albuteral
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
## 2012-12-02 LAB — URINALYSIS W MICROSCOPIC + REFLEX CULTURE
Bacteria, UA: NONE SEEN
Bilirubin Urine: NEGATIVE
Crystals: NONE SEEN
Ketones, ur: NEGATIVE mg/dL
Nitrite: NEGATIVE
Protein, ur: NEGATIVE mg/dL
Specific Gravity, Urine: 1.025 (ref 1.005–1.030)
Urobilinogen, UA: 0.2 mg/dL (ref 0.0–1.0)

## 2012-12-06 ENCOUNTER — Ambulatory Visit (HOSPITAL_COMMUNITY)
Admission: RE | Admit: 2012-12-06 | Discharge: 2012-12-06 | Disposition: A | Discharge status: Home / Self Care | Payer: No Typology Code available for payment source | Source: Ambulatory Visit | Attending: Internal Medicine | Admitting: Internal Medicine

## 2012-12-06 DIAGNOSIS — J45909 Unspecified asthma, uncomplicated: Secondary | ICD-10-CM

## 2012-12-06 DIAGNOSIS — M25519 Pain in unspecified shoulder: Secondary | ICD-10-CM | POA: Insufficient documentation

## 2012-12-09 ENCOUNTER — Telehealth: Payer: Self-pay

## 2012-12-09 NOTE — Telephone Encounter (Signed)
Left message to return our call so we can give her the results

## 2012-12-09 NOTE — Telephone Encounter (Signed)
Message copied by Lestine Mount on Fri Dec 09, 2012  2:14 PM ------      Message from: Quentin Angst      Created: Fri Dec 09, 2012 12:25 PM       Please inform patient that most of her lab results come back normal. Hepatitis panel and HIV were negative. Her vagina swab is positive for bacterial vaginosis. We recommend treating with Flagyl 2 g by mouth one time dose. This is not a sexually transmitted disease ------

## 2012-12-21 ENCOUNTER — Ambulatory Visit (INDEPENDENT_AMBULATORY_CARE_PROVIDER_SITE_OTHER): Payer: No Typology Code available for payment source | Admitting: Emergency Medicine

## 2012-12-21 ENCOUNTER — Encounter: Payer: Self-pay | Admitting: Emergency Medicine

## 2012-12-21 VITALS — BP 129/83 | HR 90 | Ht 66.0 in | Wt 154.0 lb

## 2012-12-21 DIAGNOSIS — M25519 Pain in unspecified shoulder: Secondary | ICD-10-CM

## 2012-12-21 DIAGNOSIS — J45909 Unspecified asthma, uncomplicated: Secondary | ICD-10-CM

## 2012-12-21 DIAGNOSIS — M25511 Pain in right shoulder: Secondary | ICD-10-CM

## 2012-12-22 ENCOUNTER — Encounter: Payer: Self-pay | Admitting: Emergency Medicine

## 2012-12-22 DIAGNOSIS — M25519 Pain in unspecified shoulder: Secondary | ICD-10-CM | POA: Insufficient documentation
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
## 2012-12-22 NOTE — Assessment & Plan Note (Signed)
Patient advised to take NSAIDs as needed. In addition she was given a Theraband and Jobe exercises to do at home rotator cuff strengthening

## 2012-12-22 NOTE — Assessment & Plan Note (Signed)
Advised patient to continue to use albuterol inhaler. Advised to followup with PCP or pulmonary for further evaluation and treatment as needed.

## 2013-01-02 ENCOUNTER — Other Ambulatory Visit: Payer: Self-pay | Admitting: Internal Medicine

## 2013-01-02 ENCOUNTER — Ambulatory Visit: Payer: No Typology Code available for payment source | Attending: Internal Medicine | Admitting: Internal Medicine

## 2013-01-02 ENCOUNTER — Telehealth: Payer: Self-pay | Admitting: Internal Medicine

## 2013-01-02 ENCOUNTER — Other Ambulatory Visit: Payer: Self-pay | Admitting: Emergency Medicine

## 2013-01-02 VITALS — BP 104/67 | HR 95 | Temp 98.2°F | Resp 16

## 2013-01-02 DIAGNOSIS — J45901 Unspecified asthma with (acute) exacerbation: Secondary | ICD-10-CM

## 2013-01-02 MED ORDER — METRONIDAZOLE 500 MG PO TABS: 500.0000 mg | ORAL_TABLET | Freq: Three times a day (TID) | ORAL | Status: DC

## 2013-01-02 MED ORDER — METRONIDAZOLE 500 MG PO TABS: 2000.0000 mg | ORAL_TABLET | Freq: Once | ORAL | Status: DC

## 2013-01-02 MED ORDER — BUDESONIDE-FORMOTEROL FUMARATE 80-4.5 MCG/ACT IN AERO: 2.0000 | INHALATION_SPRAY | Freq: Two times a day (BID) | RESPIRATORY_TRACT | Status: DC

## 2013-01-02 MED ORDER — ALBUTEROL SULFATE HFA 108 (90 BASE) MCG/ACT IN AERS: 2.0000 | INHALATION_SPRAY | Freq: Four times a day (QID) | RESPIRATORY_TRACT | Status: DC | PRN

## 2013-01-02 NOTE — Telephone Encounter (Signed)
Pt called regarding her medication albuterol (PROVENTIL HFA;VENTOLIN HFA) 108, pt would like to know if she has to come in for a visit to get a refill or can it be called in. Pt will be coming in as a walk in today in the afternoon, please contact pt if she doesn't have to come in

## 2013-01-02 NOTE — Patient Instructions (Signed)
Pt given steroid script with chest xray order. Appt scheduled for 

## 2013-01-02 NOTE — Progress Notes (Signed)
Patient ID: [name], female   DOB: 11-02-1990, 22 y.o.   MRN: [id number]   CC:  HPI: 22 year old female with history of asthma since age 1, who continues to smoke occasionally, previously seen by Dr. Sandrea Hughs, in the pulmonology clinic on 5/16, is being seen today on an emergent basis because she continues to use albuterol inhaler 10 times a day. She is requesting a prescription for symbicort    No Known Allergies Past Medical History  Diagnosis Date  . Asthma   . Depression     h/o pp depression after 1st pregnancy  . GERD (gastroesophageal reflux disease)   . Anemia 2010   Current Outpatient Prescriptions on File Prior to Visit  Medication Sig Dispense Refill  . albuterol (PROVENTIL HFA;VENTOLIN HFA) 108 (90 BASE) MCG/ACT inhaler Inhale 2 puffs into the lungs every 6 (six) hours as needed for wheezing.  1 Inhaler  1  . Cetirizine HCl (ZYRTEC ALLERGY PO) Take by mouth.      . naproxen sodium (ANAPROX DS) 550 MG tablet Take 1 tablet (550 mg total) by mouth 2 (two) times daily with a meal.  30 tablet  1  . predniSONE (DELTASONE) 20 MG tablet Take 2 tablets (40 mg total) by mouth daily.  10 tablet  0  . Prenatal Vit-Fe Fumarate-FA (PRENATAL MULTIVITAMIN) TABS Take 1 tablet by mouth daily.      . pseudoephedrine (SUDAFED) 30 MG tablet Take 60 mg by mouth every 4 (four) hours as needed for congestion.      . sertraline (ZOLOFT) 25 MG tablet Take 1 tablet (25 mg total) by mouth daily.  30 tablet  12   No current facility-administered medications on file prior to visit.   Family History  Problem Relation Age of Onset  . Other Neg Hx   . Asthma Mother   . Arthritis Mother   . Diabetes Mother   . Hypertension Mother   . Hypertension Father   . Asthma Sister   . Allergies Mother   . Allergies Son   . Asthma Son    History   Social History  . Marital Status: Single    Spouse Name: N/A    Number of Children: N/A  . Years of Education: N/A   Occupational History  . Not  on file.   Social History Main Topics  . Smoking status: Former Smoker -- 0.50 packs/day for 6 years    Types: Cigarettes    Quit date: 11/04/2011  . Smokeless tobacco: Never Used  . Alcohol Use: No  . Drug Use: Yes    Special: Marijuana     Comment: no marijuana since being pregnant  . Sexual Activity: Yes    Birth Control/ Protection: None   Other Topics Concern  . Not on file   Social History Narrative  . No narrative on file    Review of Systems  Constitutional: Negative for fever, chills, diaphoresis, activity change, appetite change and fatigue.  HENT: Negative for ear pain, nosebleeds, congestion, facial swelling, rhinorrhea, neck pain, neck stiffness and ear discharge.   Eyes: Negative for pain, discharge, redness, itching and visual disturbance.  Respiratory: Negative for cough, choking, chest tightness, shortness of breath, wheezing and stridor.   Cardiovascular: Negative for chest pain, palpitations and leg swelling.  Gastrointestinal: Negative for abdominal distention.  Genitourinary: Negative for dysuria, urgency, frequency, hematuria, flank pain, decreased urine volume, difficulty urinating and dyspareunia.  Musculoskeletal: Negative for back pain, joint swelling, arthralgias and gait problem.  Neurological: Negative for dizziness, tremors, seizures, syncope, facial asymmetry, speech difficulty, weakness, light-headedness, numbness and headaches.  Hematological: Negative for adenopathy. Does not bruise/bleed easily.  Psychiatric/Behavioral: Negative for hallucinations, behavioral problems, confusion, dysphoric mood, decreased concentration and agitation.    Objective:   Filed Vitals:   01/02/13 1439  BP: 104/67  Pulse: 95  Temp: 98.2 F (36.8 C)  Resp: 16    Physical Exam  Constitutional: Appears well-developed and well-nourished. No distress.  HENT: Normocephalic. External right and left ear normal. Oropharynx is clear and moist.  Eyes: Conjunctivae and  EOM are normal. PERRLA, no scleral icterus.  Neck: Normal ROM. Neck supple. No JVD. No tracheal deviation. No thyromegaly.  CVS: RRR, S1/S2 +, no murmurs, no gallops, no carotid bruit.  Pulmonary: Effort and breath sounds normal, no stridor, rhonchi, wheezes, rales.  Abdominal: Soft. BS +,  no distension, tenderness, rebound or guarding.  Musculoskeletal: Normal range of motion. No edema and no tenderness.  Lymphadenopathy: No lymphadenopathy noted, cervical, inguinal. Neuro: Alert. Normal reflexes, muscle tone coordination. No cranial nerve deficit. Skin: Skin is warm and dry. No rash noted. Not diaphoretic. No erythema. No pallor.  Psychiatric: Normal mood and affect. Behavior, judgment, thought content normal.   Lab Results  Component Value Date   WBC 4.2 12/01/2012   HGB 13.9 12/01/2012   HCT 41.6 12/01/2012   MCV 79.8 12/01/2012   PLT 275 12/01/2012   Lab Results  Component Value Date   CREATININE 0.75 12/01/2012   BUN 9 12/01/2012   NA 138 12/01/2012   K 4.1 12/01/2012   CL 106 12/01/2012   CO2 27 12/01/2012    Lab Results  Component Value Date   HGBA1C 5.0% 12/01/2012   Lipid Panel  No results found for this basename: chol, trig, hdl, cholhdl, vldl, ldlcalc       Assessment and plan:   Patient Active Problem List   Diagnosis Date Noted  . Pain in joint, shoulder region 12/22/2012  . S/P cesarean section 07/02/2012  . 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 Asthma 02/17/2012  . Nausea/vomiting in pregnancy 11/05/2011   Acute on chronic asthma exacerbation with moderate persistent asthma Advised patient to quit smoking She had a flu vaccination this year Patient has been prescribed Symbicort We'll obtain a chest x-ray Albuterol refills, eventually she may benefit from an albuterol nebulizer at home Follow up in 2 months Patient advised to call back if she is using her inhaler more than 3 times a day      The patient was given clear instructions to go to ER or return  to medical center if symptoms don't improve, worsen or new problems develop. The patient verbalized understanding. The patient was told to call to get any lab results if not heard anything in the next week.

## 2013-01-02 NOTE — Progress Notes (Signed)
Pt came in as walk in requesting stronger steroid  medication for chronic asthma. States she has used 2 refills Albuterol inhaler in the past mnth with chest tightness that worsens at night Sats 97% r/a.clear breath sounds States Symbicort works well for her.

## 2013-01-04 NOTE — Telephone Encounter (Signed)
Pt was seen By Dr. Susie Cassette, and prescribed medications

## 2013-03-01 ENCOUNTER — Encounter: Payer: Self-pay | Admitting: Obstetrics & Gynecology

## 2013-03-01 ENCOUNTER — Ambulatory Visit (INDEPENDENT_AMBULATORY_CARE_PROVIDER_SITE_OTHER): Payer: Self-pay | Admitting: Obstetrics & Gynecology

## 2013-03-01 VITALS — BP 138/77 | HR 100 | Temp 98.1°F | Resp 20 | Ht 65.5 in | Wt 165.6 lb

## 2013-03-01 DIAGNOSIS — N898 Other specified noninflammatory disorders of vagina: Secondary | ICD-10-CM

## 2013-03-01 DIAGNOSIS — Z0000 Encounter for general adult medical examination without abnormal findings: Secondary | ICD-10-CM

## 2013-03-01 LAB — WET PREP, GENITAL
Trich, Wet Prep: NONE SEEN
YEAST WET PREP: NONE SEEN

## 2013-03-01 NOTE — Progress Notes (Signed)
  Subjective:     [name] is a 23 y.o. female and is here for a comprehensive physical exam. The patient reports problems - vaginal discharge. Occurs between periods. Has previously been diagnosed with BV on multiple occasions and treated with flagyl. Denies itching or pain with this discharge. No dysuria..  Patient notes menarche at age 47. Has been having regular periods every month lasting 6-7 days. She denies using contraception at this time. Previously has tried IUD, depo, OCP, and nuvaring and did not like any of them. Is sexually active with one partner. LMP 12/31.  History   Social History  . Marital Status: Single    Spouse Name: N/A    Number of Children: N/A  . Years of Education: N/A   Occupational History  . Not on file.   Social History Main Topics  . Smoking status: Former Smoker -- 0.50 packs/day for 6 years    Types: Cigarettes    Quit date: 11/04/2011  . Smokeless tobacco: Never Used  . Alcohol Use: No  . Drug Use: Yes    Special: Marijuana     Comment: no marijuana since being pregnant  . Sexual Activity: Yes    Birth Control/ Protection: None     Comment: pt will consider using condoms   Other Topics Concern  . Not on file   Social History Narrative  . No narrative on file   Health Maintenance  Topic Date Due  . Pap Smear  08/11/2006  . Tetanus/tdap  08/11/2007  . Influenza Vaccine  09/02/2013     Review of Systems A comprehensive review of systems was negative.   Objective:    General appearance: alert, cooperative and appears stated age Head: Normocephalic, without obvious abnormality, atraumatic Eyes: negative findings: conjunctivae and sclerae normal and pupils equal, round, reactive to light and accomodation Throat: lips, mucosa, and tongue normal; teeth and gums normal Neck: no adenopathy and supple, symmetrical, trachea midline Lungs: clear to auscultation bilaterally Breasts: normal appearance, no masses or tenderness Heart:  regular rate and rhythm, S1, S2 normal, no murmur, click, rub or gallop Abdomen: soft, non-tender; bowel sounds normal; no masses,  no organomegaly Pelvic: cervix normal in appearance, external genitalia normal, no adnexal masses or tenderness, no cervical motion tenderness, uterus normal size, shape, and consistency and small amount of white discharge present Extremities: extremities normal, atraumatic, no cyanosis or edema Skin: Skin color, texture, turgor normal. No rashes or lesions    Assessment:    Healthy female exam.  Vaginal discharge     Plan:     To f/u on pap smear and wet prep/GC/Chlamydia.   Patient not using contraceptive method at this time and counseled on this. Advised to take multivitamin with folic acid in it. To follow-up in one year for annual exam.

## 2013-03-01 NOTE — Progress Notes (Signed)
Pt referred from [facility] for annual. She reports that she gets BV after every period.

## 2013-03-01 NOTE — Patient Instructions (Signed)
Contraception Choices Contraception (birth control) is the use of any methods or devices to prevent pregnancy. Below are some methods to help avoid pregnancy. HORMONAL METHODS   Contraceptive implant This is a thin, plastic tube containing progesterone hormone. It does not contain estrogen hormone. Your health care provider inserts the tube in the inner part of the upper arm. The tube can remain in place for up to 3 years. After 3 years, the implant must be removed. The implant prevents the ovaries from releasing an egg (ovulation), thickens the cervical mucus to prevent sperm from entering the uterus, and thins the lining of the inside of the uterus.  Progesterone-only injections These injections are given every 3 months by your health care provider to prevent pregnancy. This synthetic progesterone hormone stops the ovaries from releasing eggs. It also thickens cervical mucus and changes the uterine lining. This makes it harder for sperm to survive in the uterus.  Birth control pills These pills contain estrogen and progesterone hormone. They work by preventing the ovaries from releasing eggs (ovulation). They also cause the cervical mucus to thicken, preventing the sperm from entering the uterus. Birth control pills are prescribed by a health care provider.Birth control pills can also be used to treat heavy periods.  Minipill This type of birth control pill contains only the progesterone hormone. They are taken every day of each month and must be prescribed by your health care provider.  Birth control patch The patch contains hormones similar to those in birth control pills. It must be changed once a week and is prescribed by a health care provider.  Vaginal ring The ring contains hormones similar to those in birth control pills. It is left in the vagina for 3 weeks, removed for 1 week, and then a new one is put back in place. The patient must be comfortable inserting and removing the ring from the  vagina.A health care provider's prescription is necessary.  Emergency contraception Emergency contraceptives prevent pregnancy after unprotected sexual intercourse. This pill can be taken right after sex or up to 5 days after unprotected sex. It is most effective the sooner you take the pills after having sexual intercourse. Most emergency contraceptive pills are available without a prescription. Check with your pharmacist. Do not use emergency contraception as your only form of birth control. BARRIER METHODS   Female condom This is a thin sheath (latex or rubber) that is worn over the penis during sexual intercourse. It can be used with spermicide to increase effectiveness.  Female condom. This is a soft, loose-fitting sheath that is put into the vagina before sexual intercourse.  Diaphragm This is a soft, latex, dome-shaped barrier that must be fitted by a health care provider. It is inserted into the vagina, along with a spermicidal jelly. It is inserted before intercourse. The diaphragm should be left in the vagina for 6 to 8 hours after intercourse.  Cervical cap This is a round, soft, latex or plastic cup that fits over the cervix and must be fitted by a health care provider. The cap can be left in place for up to 48 hours after intercourse.  Sponge This is a soft, circular piece of polyurethane foam. The sponge has spermicide in it. It is inserted into the vagina after wetting it and before sexual intercourse.  Spermicides These are chemicals that kill or block sperm from entering the cervix and uterus. They come in the form of creams, jellies, suppositories, foam, or tablets. They do not require a   prescription. They are inserted into the vagina with an applicator before having sexual intercourse. The process must be repeated every time you have sexual intercourse. INTRAUTERINE CONTRACEPTION  Intrauterine device (IUD) This is a T-shaped device that is put in a woman's uterus during a  menstrual period to prevent pregnancy. There are 2 types:  Copper IUD This type of IUD is wrapped in copper wire and is placed inside the uterus. Copper makes the uterus and fallopian tubes produce a fluid that kills sperm. It can stay in place for 10 years.  Hormone IUD This type of IUD contains the hormone progestin (synthetic progesterone). The hormone thickens the cervical mucus and prevents sperm from entering the uterus, and it also thins the uterine lining to prevent implantation of a fertilized egg. The hormone can weaken or kill the sperm that get into the uterus. It can stay in place for 3 5 years, depending on which type of IUD is used. PERMANENT METHODS OF CONTRACEPTION  Female tubal ligation This is when the woman's fallopian tubes are surgically sealed, tied, or blocked to prevent the egg from traveling to the uterus.  Hysteroscopic sterilization This involves placing a small coil or insert into each fallopian tube. Your doctor uses a technique called hysteroscopy to do the procedure. The device causes scar tissue to form. This results in permanent blockage of the fallopian tubes, so the sperm cannot fertilize the egg. It takes about 3 months after the procedure for the tubes to become blocked. You must use another form of birth control for these 3 months.  Female sterilization This is when the female has the tubes that carry sperm tied off (vasectomy).This blocks sperm from entering the vagina during sexual intercourse. After the procedure, the man can still ejaculate fluid (semen). NATURAL PLANNING METHODS  Natural family planning This is not having sexual intercourse or using a barrier method (condom, diaphragm, cervical cap) on days the woman could become pregnant.  Calendar method This is keeping track of the length of each menstrual cycle and identifying when you are fertile.  Ovulation method This is avoiding sexual intercourse during ovulation.  Symptothermal method This is  avoiding sexual intercourse during ovulation, using a thermometer and ovulation symptoms.  Post ovulation method This is timing sexual intercourse after you have ovulated. Regardless of which type or method of contraception you choose, it is important that you use condoms to protect against the transmission of sexually transmitted infections (STIs). Talk with your health care provider about which form of contraception is most appropriate for you. Document Released: 01/19/2005 Document Revised: 09/21/2012 Document Reviewed: 07/14/2012 [employer] Patient Information 2014 [employer].  

## 2013-03-07 ENCOUNTER — Telehealth: Payer: Self-pay | Admitting: *Deleted

## 2013-03-07 DIAGNOSIS — N760 Acute vaginitis: Principal | ICD-10-CM

## 2013-03-07 DIAGNOSIS — B9689 Other specified bacterial agents as the cause of diseases classified elsewhere: Secondary | ICD-10-CM

## 2013-03-07 MED ORDER — METRONIDAZOLE 500 MG PO TABS: 500.0000 mg | ORAL_TABLET | Freq: Two times a day (BID) | ORAL | Status: DC

## 2013-03-07 NOTE — Telephone Encounter (Signed)
Pt called nurse line and states she can see her results in my chart and wanted to know if we had prescribed her any medication.  Spoke with patient and informed of medication at her pharmacy.  Pt verbalizes understanding.  Pt will schedule follow up appt for BV if it continues.

## 2013-03-16 ENCOUNTER — Encounter: Payer: Self-pay | Admitting: Internal Medicine

## 2013-03-16 ENCOUNTER — Ambulatory Visit: Payer: Self-pay | Attending: Internal Medicine | Admitting: Internal Medicine

## 2013-03-16 ENCOUNTER — Ambulatory Visit: Payer: Self-pay | Admitting: Internal Medicine

## 2013-03-16 VITALS — BP 110/77 | HR 75 | Temp 98.2°F | Resp 16 | Wt 161.2 lb

## 2013-03-16 DIAGNOSIS — N926 Irregular menstruation, unspecified: Secondary | ICD-10-CM

## 2013-03-16 DIAGNOSIS — Z331 Pregnant state, incidental: Secondary | ICD-10-CM | POA: Insufficient documentation

## 2013-03-16 DIAGNOSIS — J45909 Unspecified asthma, uncomplicated: Secondary | ICD-10-CM | POA: Insufficient documentation

## 2013-03-16 DIAGNOSIS — IMO0002 Reserved for concepts with insufficient information to code with codable children: Secondary | ICD-10-CM | POA: Insufficient documentation

## 2013-03-16 DIAGNOSIS — Z3201 Encounter for pregnancy test, result positive: Secondary | ICD-10-CM

## 2013-03-16 DIAGNOSIS — Z87891 Personal history of nicotine dependence: Secondary | ICD-10-CM | POA: Insufficient documentation

## 2013-03-16 LAB — POCT URINE PREGNANCY: Preg Test, Ur: POSITIVE

## 2013-03-16 MED ORDER — ALBUTEROL SULFATE HFA 108 (90 BASE) MCG/ACT IN AERS: 2.0000 | INHALATION_SPRAY | Freq: Four times a day (QID) | RESPIRATORY_TRACT | Status: DC | PRN

## 2013-03-16 NOTE — Progress Notes (Signed)
Patient here for her asthma States needs an inhaler Has been about a week late with her period

## 2013-03-16 NOTE — Progress Notes (Signed)
MRN: [id number] Name: [name]  Sex: female Age: 23 y.o. DOB: 08/15/1990  Allergies: Review of patient's allergies indicates no known allergies.  Chief Complaint  Patient presents with  . Asthma    HPI: Patient is 23 y.o. female who has history of asthma comes today requesting refill on her albuterol inhaler, currently denies any headache dizziness chest and shortness of breath or any wheezing, she used her inhaler last night, in the past few days she used her inhaler more often, denies any fever chills. Patient used to follow with her pulmonologist. Patient also had a missed her periods, her urine pregnancy test today is positive.  Past Medical History  Diagnosis Date  . Asthma   . Depression     h/o pp depression after 1st pregnancy  . GERD (gastroesophageal reflux disease)   . Anemia 2010    Past Surgical History  Procedure Laterality Date  . Wisdom tooth extraction    . Cesarean section  11/01/2008  . Cesarean section N/A 06/29/2012    Procedure: CESAREAN SECTION;  Surgeon: Melina Schools, MD;  Location: [facility];  Service: Obstetrics;  Laterality: N/A;  1 1/2 hrs OR time       Medication List       This list is accurate as of: 03/16/13  1:06 PM.  Always use your most recent med list.               albuterol 108 (90 BASE) MCG/ACT inhaler  Commonly known as:  PROVENTIL HFA;VENTOLIN HFA  Inhale 2 puffs into the lungs every 6 (six) hours as needed for wheezing or shortness of breath.     metroNIDAZOLE 500 MG tablet  Commonly known as:  FLAGYL  Take 1 tablet (500 mg total) by mouth 2 (two) times daily.     naproxen sodium 550 MG tablet  Commonly known as:  ANAPROX DS  Take 1 tablet (550 mg total) by mouth 2 (two) times daily with a meal.     predniSONE 20 MG tablet  Commonly known as:  DELTASONE  Take 2 tablets (40 mg total) by mouth daily.     prenatal multivitamin Tabs tablet  Take 1 tablet by mouth daily.     pseudoephedrine 30 MG tablet  Commonly  known as:  SUDAFED  Take 60 mg by mouth every 4 (four) hours as needed for congestion.     sertraline 25 MG tablet  Commonly known as:  ZOLOFT  Take 1 tablet (25 mg total) by mouth daily.     ZYRTEC ALLERGY PO  Take by mouth.        Meds ordered this encounter  Medications  . albuterol (PROVENTIL HFA;VENTOLIN HFA) 108 (90 BASE) MCG/ACT inhaler    Sig: Inhale 2 puffs into the lungs every 6 (six) hours as needed for wheezing or shortness of breath.    Dispense:  18 g    Refill:  1 YEAR    Immunization History  Administered Date(s) Administered  . Influenza Whole 11/03/2011  . Influenza,inj,Quad PF,36+ Mos 12/01/2012  . Pneumococcal Polysaccharide-23 07/02/2012    Family History  Problem Relation Age of Onset  . Other Neg Hx   . Asthma Mother   . Arthritis Mother   . Diabetes Mother   . Hypertension Mother   . Hypertension Father   . Asthma Sister   . Allergies Mother   . Allergies Son   . Asthma Son     History  Substance Use Topics  .  Smoking status: Former Smoker -- 0.50 packs/day for 6 years    Types: Cigarettes    Quit date: 11/04/2011  . Smokeless tobacco: Never Used  . Alcohol Use: No    Review of Systems   As noted in HPI  Filed Vitals:   03/16/13 1236  BP: 110/77  Pulse: 75  Temp: 98.2 F (36.8 C)  Resp: 16    Physical Exam  Physical Exam  Constitutional: No distress.  Eyes: EOM are normal. Pupils are equal, round, and reactive to light.  Cardiovascular: Normal rate and regular rhythm.   Pulmonary/Chest: Breath sounds normal. No respiratory distress. She has no wheezes. She has no rales.  Musculoskeletal: She exhibits no edema.    CBC    Component Value Date/Time   WBC 4.2 12/01/2012 0947   RBC 5.21* 12/01/2012 0947   HGB 13.9 12/01/2012 0947   HCT 41.6 12/01/2012 0947   PLT 275 12/01/2012 0947   MCV 79.8 12/01/2012 0947   LYMPHSABS 1.8 03/20/2011 1145   MONOABS 0.4 03/20/2011 1145   EOSABS 0.0 03/20/2011 1145   BASOSABS 0.0  03/20/2011 1145    CMP     Component Value Date/Time   NA 138 12/01/2012 0947   K 4.1 12/01/2012 0947   CL 106 12/01/2012 0947   CO2 27 12/01/2012 0947   GLUCOSE 90 12/01/2012 0947   BUN 9 12/01/2012 0947   CREATININE 0.75 12/01/2012 0947   CREATININE 0.62 06/29/2012 0830   CALCIUM 9.6 12/01/2012 0947   PROT 7.7 12/01/2012 0947   ALBUMIN 4.7 12/01/2012 0947   AST 17 12/01/2012 0947   ALT 19 12/01/2012 0947   ALKPHOS 65 12/01/2012 0947   BILITOT 0.9 12/01/2012 0947   GFRNONAA >90 06/29/2012 0830   GFRAA >90 06/29/2012 0830    No results found for this basename: chol, tri, ldl    No components found with this basename: hga1c    Lab Results  Component Value Date/Time   AST 17 12/01/2012  9:47 AM    Assessment and Plan  Asthma - Plan: albuterol (PROVENTIL HFA;VENTOLIN HFA) 108 (90 BASE) MCG/ACT inhaler, Ambulatory referral to Pulmonology to evaluate if she needs any inhale steroids.  Missed period - Plan: POCT urine pregnancy positive  Positive urine pregnancy test - Plan: Ambulatory referral to Gynecology    Return if symptoms worsen or fail to improve.  Lorayne Marek, MD

## 2013-03-17 ENCOUNTER — Ambulatory Visit: Payer: Self-pay | Admitting: Internal Medicine

## 2013-03-20 ENCOUNTER — Encounter: Payer: Self-pay | Admitting: Obstetrics & Gynecology

## 2013-03-21 ENCOUNTER — Ambulatory Visit: Payer: Self-pay | Admitting: Internal Medicine

## 2013-03-31 ENCOUNTER — Ambulatory Visit: Payer: Self-pay | Attending: Internal Medicine | Admitting: Internal Medicine

## 2013-03-31 VITALS — BP 110/75 | HR 96 | Temp 98.7°F | Resp 14 | Ht 65.5 in | Wt 163.0 lb

## 2013-03-31 DIAGNOSIS — J45901 Unspecified asthma with (acute) exacerbation: Secondary | ICD-10-CM | POA: Insufficient documentation

## 2013-03-31 DIAGNOSIS — K219 Gastro-esophageal reflux disease without esophagitis: Secondary | ICD-10-CM | POA: Insufficient documentation

## 2013-03-31 DIAGNOSIS — F17200 Nicotine dependence, unspecified, uncomplicated: Secondary | ICD-10-CM | POA: Insufficient documentation

## 2013-03-31 MED ORDER — MOMETASONE FURO-FORMOTEROL FUM 200-5 MCG/ACT IN AERO: 2.0000 | INHALATION_SPRAY | Freq: Two times a day (BID) | RESPIRATORY_TRACT | Status: DC

## 2013-03-31 MED ORDER — PREDNISONE 20 MG PO TABS: 40.0000 mg | ORAL_TABLET | Freq: Every day | ORAL | Status: DC

## 2013-03-31 MED ORDER — CETIRIZINE HCL 10 MG PO CAPS
ORAL_CAPSULE | ORAL | Status: DC
Start: 2013-03-31 — End: 2014-04-24

## 2013-03-31 MED ORDER — LEVOFLOXACIN 500 MG PO TABS: 500.0000 mg | ORAL_TABLET | Freq: Every day | ORAL | Status: DC

## 2013-03-31 NOTE — Progress Notes (Signed)
Pt is here w/ flu like symptoms for 2 weeks.  Pt states that she has been sick this year multiple times.

## 2013-03-31 NOTE — Progress Notes (Signed)
Patient ID: [name], female   DOB: [birth year], 23 y.o.   MRN: [id number]   CC:  HPI: 23 year old female with a history of asthma which is uncontrolled. Has been seen for asthma exacerbation on 2/12. Patient using her albuterol 4 times a day. She had an appointment with pulmonology this week but missed it because of the snow. She is in the process of rescheduling it. She has not quit smoking. She complains of URI like symptoms. Cough congestion for the last couple of months.     No Known Allergies Past Medical History  Diagnosis Date  . Asthma   . Depression     h/o pp depression after 1st pregnancy  . GERD (gastroesophageal reflux disease)   . Anemia 2010   Current Outpatient Prescriptions on File Prior to Visit  Medication Sig Dispense Refill  . albuterol (PROVENTIL HFA;VENTOLIN HFA) 108 (90 BASE) MCG/ACT inhaler Inhale 2 puffs into the lungs every 6 (six) hours as needed for wheezing or shortness of breath.  18 g  1 YEAR  . Prenatal Vit-Fe Fumarate-FA (PRENATAL MULTIVITAMIN) TABS Take 1 tablet by mouth daily.      . pseudoephedrine (SUDAFED) 30 MG tablet Take 60 mg by mouth every 4 (four) hours as needed for congestion.      . metroNIDAZOLE (FLAGYL) 500 MG tablet Take 1 tablet (500 mg total) by mouth 2 (two) times daily.  14 tablet  0  . naproxen sodium (ANAPROX DS) 550 MG tablet Take 1 tablet (550 mg total) by mouth 2 (two) times daily with a meal.  30 tablet  1  . sertraline (ZOLOFT) 25 MG tablet Take 1 tablet (25 mg total) by mouth daily.  30 tablet  12   No current facility-administered medications on file prior to visit.   Family History  Problem Relation Age of Onset  . Other Neg Hx   . Asthma Mother   . Arthritis Mother   . Diabetes Mother   . Hypertension Mother   . Hypertension Father   . Asthma Sister   . Allergies Mother   . Allergies Son   . Asthma Son    History   Social History  . Marital Status: Single    Spouse Name: N/A    Number of Children: N/A   . Years of Education: N/A   Occupational History  . Not on file.   Social History Main Topics  . Smoking status: Former Smoker -- 0.50 packs/day for 6 years    Types: Cigarettes    Quit date: 11/04/2011  . Smokeless tobacco: Never Used  . Alcohol Use: No  . Drug Use: Yes    Special: Marijuana     Comment: no marijuana since being pregnant  . Sexual Activity: Yes    Birth Control/ Protection: None     Comment: pt will consider using condoms   Other Topics Concern  . Not on file   Social History Narrative  . No narrative on file    Review of Systems  Constitutional: Negative for fever, chills, diaphoresis, activity change, appetite change and fatigue.  HENT: Negative for ear pain, nosebleeds, congestion, facial swelling, rhinorrhea, neck pain, neck stiffness and ear discharge.   Eyes: Negative for pain, discharge, redness, itching and visual disturbance.  Respiratory: As in history of present illness Cardiovascular: Negative for chest pain, palpitations and leg swelling.  Gastrointestinal: Negative for abdominal distention.  Genitourinary: Negative for dysuria, urgency, frequency, hematuria, flank pain, decreased urine volume, difficulty urinating and dyspareunia.  Musculoskeletal: Negative for back pain, joint swelling, arthralgias and gait problem.  Neurological: Negative for dizziness, tremors, seizures, syncope, facial asymmetry, speech difficulty, weakness, light-headedness, numbness and headaches.  Hematological: Negative for adenopathy. Does not bruise/bleed easily.  Psychiatric/Behavioral: Negative for hallucinations, behavioral problems, confusion, dysphoric mood, decreased concentration and agitation.    Objective:   Filed Vitals:   03/31/13 1112  BP: 110/75  Pulse: 96  Temp: 98.7 F (37.1 C)  Resp: 14    Physical Exam  Constitutional: Appears well-developed and well-nourished. No distress.  HENT: Normocephalic. External right and left ear normal.  Oropharynx is clear and moist.  Eyes: Conjunctivae and EOM are normal. PERRLA, no scleral icterus.  Neck: Normal ROM. Neck supple. No JVD. No tracheal deviation. No thyromegaly.  CVS: RRR, S1/S2 +, no murmurs, no gallops, no carotid bruit.  Pulmonary: Effort and breath sounds normal, no stridor, rhonchi, wheezes, rales.  Abdominal: Soft. BS +,  no distension, tenderness, rebound or guarding.  Musculoskeletal: Normal range of motion. No edema and no tenderness.  Lymphadenopathy: No lymphadenopathy noted, cervical, inguinal. Neuro: Alert. Normal reflexes, muscle tone coordination. No cranial nerve deficit. Skin: Skin is warm and dry. No rash noted. Not diaphoretic. No erythema. No pallor.  Psychiatric: Normal mood and affect. Behavior, judgment, thought content normal.   Lab Results  Component Value Date   WBC 4.2 12/01/2012   HGB 13.9 12/01/2012   HCT 41.6 12/01/2012   MCV 79.8 12/01/2012   PLT 275 12/01/2012   Lab Results  Component Value Date   CREATININE 0.75 12/01/2012   BUN 9 12/01/2012   NA 138 12/01/2012   K 4.1 12/01/2012   CL 106 12/01/2012   CO2 27 12/01/2012    Lab Results  Component Value Date   HGBA1C 5.0% 12/01/2012   Lipid Panel  No results found for this basename: chol, trig, hdl, cholhdl, vldl, ldlcalc       Assessment and plan:   Patient Active Problem List   Diagnosis Date Noted  . Pain in joint, shoulder region 12/22/2012  . S/P cesarean section 07/02/2012  . 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 Asthma 02/17/2012  . Nausea/vomiting in pregnancy 11/05/2011       Acute asthma exacerbation We'll prescribe levofloxacin for 7 days Prednisone for asthma exacerbation Start the patient on dulera, Patient advised to continue using cetrizine/loratadine Patient followup with pulmonology for uncontrolled asthma symptoms for the last one year  Of note is that the patient had a positive urine pregnancy test during her last visit. She states today that the patient  had an abortion last Friday.   The patient was given clear instructions to go to ER or return to medical center if symptoms don't improve, worsen or new problems develop. The patient verbalized understanding. The patient was told to call to get any lab results if not heard anything in the next week.

## 2013-04-05 ENCOUNTER — Ambulatory Visit: Payer: Self-pay | Admitting: Internal Medicine

## 2013-04-10 ENCOUNTER — Ambulatory Visit: Payer: Self-pay | Admitting: Internal Medicine

## 2013-04-18 ENCOUNTER — Encounter: Payer: Self-pay | Admitting: Obstetrics & Gynecology

## 2013-04-26 ENCOUNTER — Ambulatory Visit: Payer: Self-pay | Admitting: Internal Medicine

## 2013-05-04 ENCOUNTER — Ambulatory Visit: Payer: Self-pay

## 2013-05-22 ENCOUNTER — Ambulatory Visit: Payer: Self-pay

## 2013-07-26 ENCOUNTER — Ambulatory Visit: Payer: Self-pay | Attending: Internal Medicine

## 2013-08-02 ENCOUNTER — Other Ambulatory Visit: Payer: Self-pay | Admitting: Internal Medicine

## 2013-08-02 MED ORDER — MOMETASONE FURO-FORMOTEROL FUM 200-5 MCG/ACT IN AERO: 2.0000 | INHALATION_SPRAY | Freq: Two times a day (BID) | RESPIRATORY_TRACT | Status: DC

## 2013-08-16 ENCOUNTER — Ambulatory Visit: Payer: Self-pay | Attending: Internal Medicine

## 2013-08-17 ENCOUNTER — Encounter: Payer: Self-pay | Admitting: Advanced Practice Midwife

## 2013-08-17 ENCOUNTER — Ambulatory Visit (INDEPENDENT_AMBULATORY_CARE_PROVIDER_SITE_OTHER): Payer: Self-pay | Admitting: Advanced Practice Midwife

## 2013-08-17 VITALS — BP 121/77 | HR 100 | Temp 97.5°F | Ht 65.0 in | Wt 155.1 lb

## 2013-08-17 DIAGNOSIS — Z711 Person with feared health complaint in whom no diagnosis is made: Secondary | ICD-10-CM

## 2013-08-17 DIAGNOSIS — N898 Other specified noninflammatory disorders of vagina: Secondary | ICD-10-CM

## 2013-08-17 DIAGNOSIS — Z3493 Encounter for supervision of normal pregnancy, unspecified, third trimester: Secondary | ICD-10-CM

## 2013-08-17 LAB — POCT PREGNANCY, URINE: PREG TEST UR: NEGATIVE

## 2013-08-17 NOTE — Progress Notes (Signed)
  Subjective:     [name] is a 23 y.o. female who presents for evaluation of abdominal pain. The pain is described as cramping, and is 5/10 in intensity. Pain is located in the LLQ area without radiation. Onset was gradual occurring 2 weeks ago. Symptoms have been unchanged since. Aggravating factors: none. Alleviating factors: acetaminophen. Associated symptoms: vaginal discharge. The patient denies chills, dysuria and fever. Risk factors for pelvic/abdominal pain include none. Requests STD testing and pregnancy test.  Is not on birth control. Has tried pills, IUD and Depo and "did not do well" on any of them. Now uses calendar method and intermittent condoms.  Menstrual History: OB History   Grav Para Term Preterm Abortions TAB SAB Ect Mult Living   2 2 2  0 0 0 0 0 0 2       Patient's last menstrual period was 07/29/2013.    The following portions of the patient's history were reviewed and updated as appropriate: allergies, current medications, past family history, past medical history, past social history, past surgical history and problem list.   Review of Systems Pertinent items are noted in HPI.    Objective:    BP 121/77  Pulse 100  Temp(Src) 97.5 F (36.4 C) (Oral)  Ht 5\' 5"  (1.651 m)  Wt 70.353 kg (155 lb 1.6 oz)  BMI 25.81 kg/m2  LMP 07/29/2013 General:   alert, cooperative and no distress  Lungs:   clear to auscultation bilaterally  Heart:   regular rate and rhythm, S1, S2 normal, no murmur, click, rub or gallop  Abdomen:  soft, non-tender; bowel sounds normal; no masses,  no organomegaly  CVA:   absent  Pelvis:  Vulva and vagina appear normal. Bimanual exam reveals normal uterus and adnexa. very mild tenderness LLQ  Extremities:   extremities normal, atraumatic, no cyanosis or edema  Neurologic:   Alert and oriented x3. Gait normal. Reflexes and motor strength normal and symmetric. Cranial nerves 2-12 and sensation grossly intact.  Psychiatric:   normal mood,  behavior, speech, dress, and thought processes   Lab Review Labs: Urine pregnancy test, GC/Chlamydia DNA probe of cervico/vaginal secretions and Wet mount of vaginal secretions   Imaging Ultrasound - Pelvic Vaginal   Offered to patient, who declines at present.  Will do if pain persists beyone 1-2 weeks.    Assessment:    Vaginal discharge   Concern for STDs   No contraception  Plan:    Reassured patient that symptoms are almost certainly benign and self-resolving. Further follow-up plans will be based on outcome of lab/imaging studies; see orders. Follow up as needed.  If pain persists, will order [place]. Discussed NFP and condom use with spermacide

## 2013-08-17 NOTE — Progress Notes (Signed)
Patient here today with complaints of increasing pelvic cramping without period and vaginal discharge with odor. Denies itching, burning or irritation. Patient also requests pregnancy test-- reports LMP was 07/29/13-- patient does not want to wait to see if she gets July period. Also wishes to be tested for STDs--  GC/chlamydia and trich.

## 2013-08-17 NOTE — Patient Instructions (Signed)
Pelvic Pain, Female Female pelvic pain can be caused by many different things and start from a variety of places. Pelvic pain refers to pain that is located in the lower half of the abdomen and between your hips. The pain may occur over a short period of time (acute) or may be reoccurring (chronic). The cause of pelvic pain may be related to disorders affecting the female reproductive organs (gynecologic), but it may also be related to the bladder, kidney stones, an intestinal complication, or muscle or skeletal problems. Getting help right away for pelvic pain is important, especially if there has been severe, sharp, or a sudden onset of unusual pain. It is also important to get help right away because some types of pelvic pain can be life threatening.  CAUSES  Below are only some of the causes of pelvic pain. The causes of pelvic pain can be in one of several categories.   Gynecologic.  Pelvic inflammatory disease.  Sexually transmitted infection.  Ovarian cyst or a twisted ovarian ligament (ovarian torsion).  Uterine lining that grows outside the uterus (endometriosis).  Fibroids, cysts, or tumors.  Ovulation.  Pregnancy.  Pregnancy that occurs outside the uterus (ectopic pregnancy).  Miscarriage.  Labor.  Abruption of the placenta or ruptured uterus.  Infection.  Uterine infection (endometritis).  Bladder infection.  Diverticulitis.  Miscarriage related to a uterine infection (septic abortion).  Bladder.  Inflammation of the bladder (cystitis).  Kidney stone(s).  Gastrointenstinal.  Constipation.  Diverticulitis.  Neurologic.  Trauma.  Feeling pelvic pain because of mental or emotional causes (psychosomatic).  Cancers of the bowel or pelvis. EVALUATION  Your caregiver will want to take a careful history of your concerns. This includes recent changes in your health, a careful gynecologic history of your periods (menses), and a sexual history. Obtaining  your family history and medical history is also important. Your caregiver may suggest a pelvic exam. A pelvic exam will help identify the location and severity of the pain. It also helps in the evaluation of which organ system may be involved. In order to identify the cause of the pelvic pain and be properly treated, your caregiver may order tests. These tests may include:   A pregnancy test.  Pelvic ultrasonography.  An X-ray exam of the abdomen.  A urinalysis or evaluation of vaginal discharge.  Blood tests. HOME CARE INSTRUCTIONS   Only take over-the-counter or prescription medicines for pain, discomfort, or fever as directed by your caregiver.   Rest as directed by your caregiver.   Eat a balanced diet.   Drink enough fluids to make your urine clear or pale yellow, or as directed.   Avoid sexual intercourse if it causes pain.   Apply warm or cold compresses to the lower abdomen depending on which one helps the pain.   Avoid stressful situations.   Keep a journal of your pelvic pain. Write down when it started, where the pain is located, and if there are things that seem to be associated with the pain, such as food or your menstrual cycle.  Follow up with your caregiver as directed.  SEEK MEDICAL CARE IF:  Your medicine does not help your pain.  You have abnormal vaginal discharge. SEEK IMMEDIATE MEDICAL CARE IF:   You have heavy bleeding from the vagina.   Your pelvic pain increases.   You feel lightheaded or faint.   You have chills.   You have pain with urination or blood in your urine.   You have uncontrolled  diarrhea or vomiting.   You have a fever or persistent symptoms for more than 3 days.  You have a fever and your symptoms suddenly get worse.   You are being physically or sexually abused.  MAKE SURE YOU:  Understand these instructions.  Will watch your condition.  Will get help if you are not doing well or get worse. Document  Released: 12/17/2003 Document Revised: 07/21/2011 Document Reviewed: 05/11/2011 [facility] Patient Information 2015 [place], [place]. This information is not intended to replace advice given to you by your health care provider. Make sure you discuss any questions you have with your health care provider. Safe Sex Safe sex is about reducing the risk of giving or getting a sexually transmitted disease (STD). STDs are spread through sexual contact involving the genitals, mouth, or rectum. Some STDs can be cured and others cannot. Safe sex can also prevent unintended pregnancies.  WHAT ARE SOME SAFE SEX PRACTICES?  Limit your sexual activity to only one partner who is only having sex with you.  Talk to your partner about his or her past partners, past STDs, and drug use.  Use a condom every time you have sexual intercourse. This includes vaginal, oral, and anal sexual activity. Both females and males should wear condoms during oral sex. Only use latex or polyurethane condoms and water-based lubricants. Using petroleum-based lubricants or oils to lubricate a condom will weaken the condom and increase the chance that it will break. The condom should be in place from the beginning to the end of sexual activity. Wearing a condom reduces, but does not completely eliminate, your risk of getting or giving an STD. STDs can be spread by contact with infected body fluids and skin.  Get vaccinated for hepatitis B and HPV.  Avoid alcohol and recreational drugs which can affect your judgement. You may forget to use a condom or participate in high-risk sex.  For females, avoid douching after sexual intercourse. Douching can spread an infection farther into the reproductive tract.  Check your body for signs of sores, blisters, rashes, or unusual discharge. See your health care provider if you notice any of these signs.  Avoid sexual contact if you have symptoms of an infection or are being treated for an STD. If you or  your partner has herpes, avoid sexual contact when blisters are present. Use condoms at all other times.  If you are at risk of being infected with HIV, it is recommended that you take a prescription medicine daily to prevent HIV infection. This is called pre-exposure prophylaxis (PrEP). You are considered at risk if:  You are a man who has sex with other men (MSM).  You are a heterosexual man or woman who is sexually active with more than one partner.  You take drugs by injection.  You are sexually active with a partner who has HIV.  Talk with your health care provider about whether you are at high risk of being infected with HIV. If you choose to begin PrEP, you should first be tested for HIV. You should then be tested every 3 months for as long as you are taking PrEP.  See your health care provider for regular screenings, exams, and tests for other STDs. Before having sex with a new partner, each of you should be screened for STDs and should talk about the results with each other. WHAT ARE THE BENEFITS OF SAFE SEX?   There is less chance of getting or giving an STD.  You can prevent  unwanted or unintended pregnancies.  By discussing safe sex concerns with your partner, you may increase feelings of intimacy, comfort, trust, and honesty between the two of you. Document Released: 02/27/2004 Document Revised: 01/24/2013 Document Reviewed: 07/13/2011 [facility] Patient Information 2015 [place], [place]. This information is not intended to replace advice given to you by your health care provider. Make sure you discuss any questions you have with your health care provider.

## 2013-08-18 LAB — WET PREP, GENITAL
Trich, Wet Prep: NONE SEEN
WBC, Wet Prep HPF POC: NONE SEEN
Yeast Wet Prep HPF POC: NONE SEEN

## 2013-08-19 LAB — GC/CHLAMYDIA PROBE AMP
CT Probe RNA: NEGATIVE
GC Probe RNA: NEGATIVE

## 2013-08-23 ENCOUNTER — Telehealth: Payer: Self-pay | Admitting: General Practice

## 2013-08-23 DIAGNOSIS — B9689 Other specified bacterial agents as the cause of diseases classified elsewhere: Secondary | ICD-10-CM

## 2013-08-23 DIAGNOSIS — N760 Acute vaginitis: Principal | ICD-10-CM

## 2013-08-23 MED ORDER — METRONIDAZOLE 500 MG PO TABS: 500.0000 mg | ORAL_TABLET | Freq: Two times a day (BID) | ORAL | Status: DC

## 2013-08-23 NOTE — Telephone Encounter (Signed)
Patient called and left message that she would like her test results. Called patient, no answer- left message that we are trying to return your phone call, please call us back at the clinics. Patient does have BV, flagyl ordered.

## 2013-08-23 NOTE — Telephone Encounter (Signed)
Patient called back to front office and was informed of BV and other negative results and medication available for pickup. Patient verbalized understanding and had no other questions

## 2013-09-13 ENCOUNTER — Ambulatory Visit: Payer: Self-pay

## 2013-12-04 ENCOUNTER — Encounter: Payer: Self-pay | Admitting: Advanced Practice Midwife

## 2013-12-27 IMAGING — CR DG SHOULDER 2+V*R*
3 series · 3 of 3 positions shown · non-contrast
Comparison: None.

CLINICAL DATA: Intermittent right shoulder pain. Pain in the
superior and posterior aspect.

EXAM:
RIGHT SHOULDER - 2+ VIEW

[w shoulder external right]
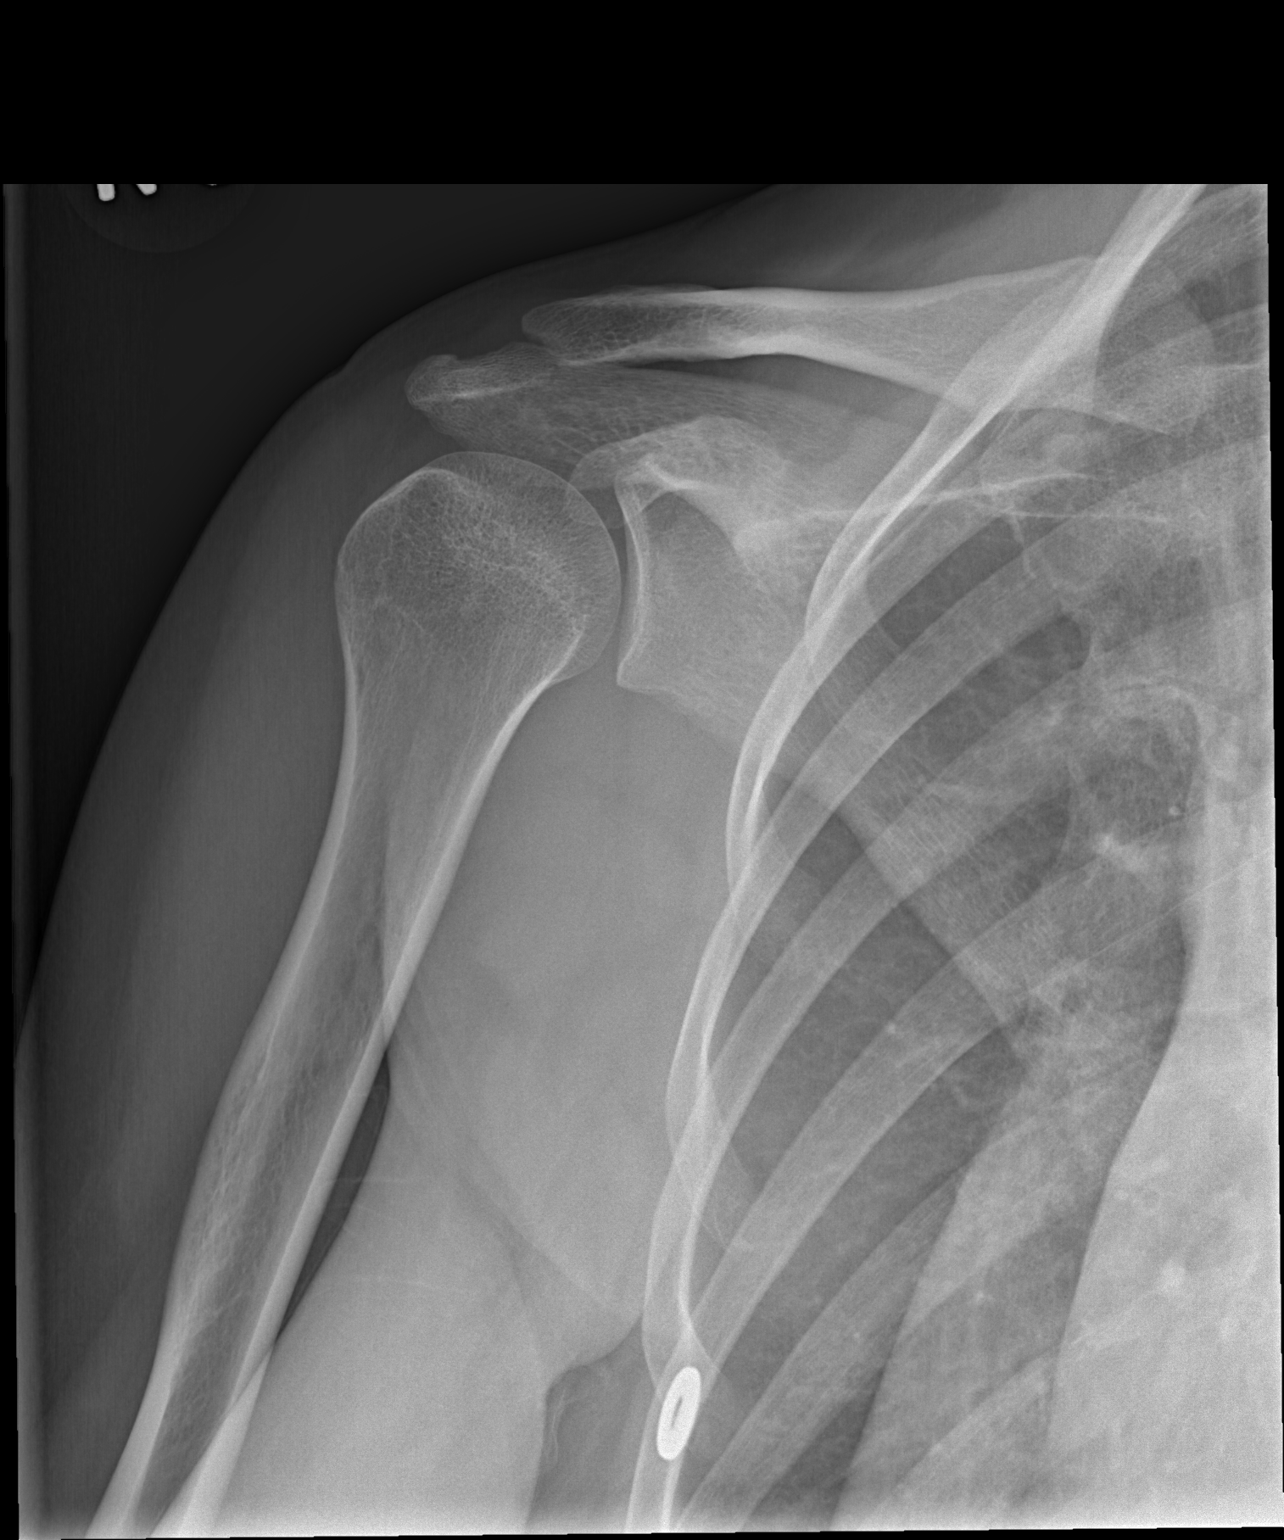

[w shoulder y-view right]
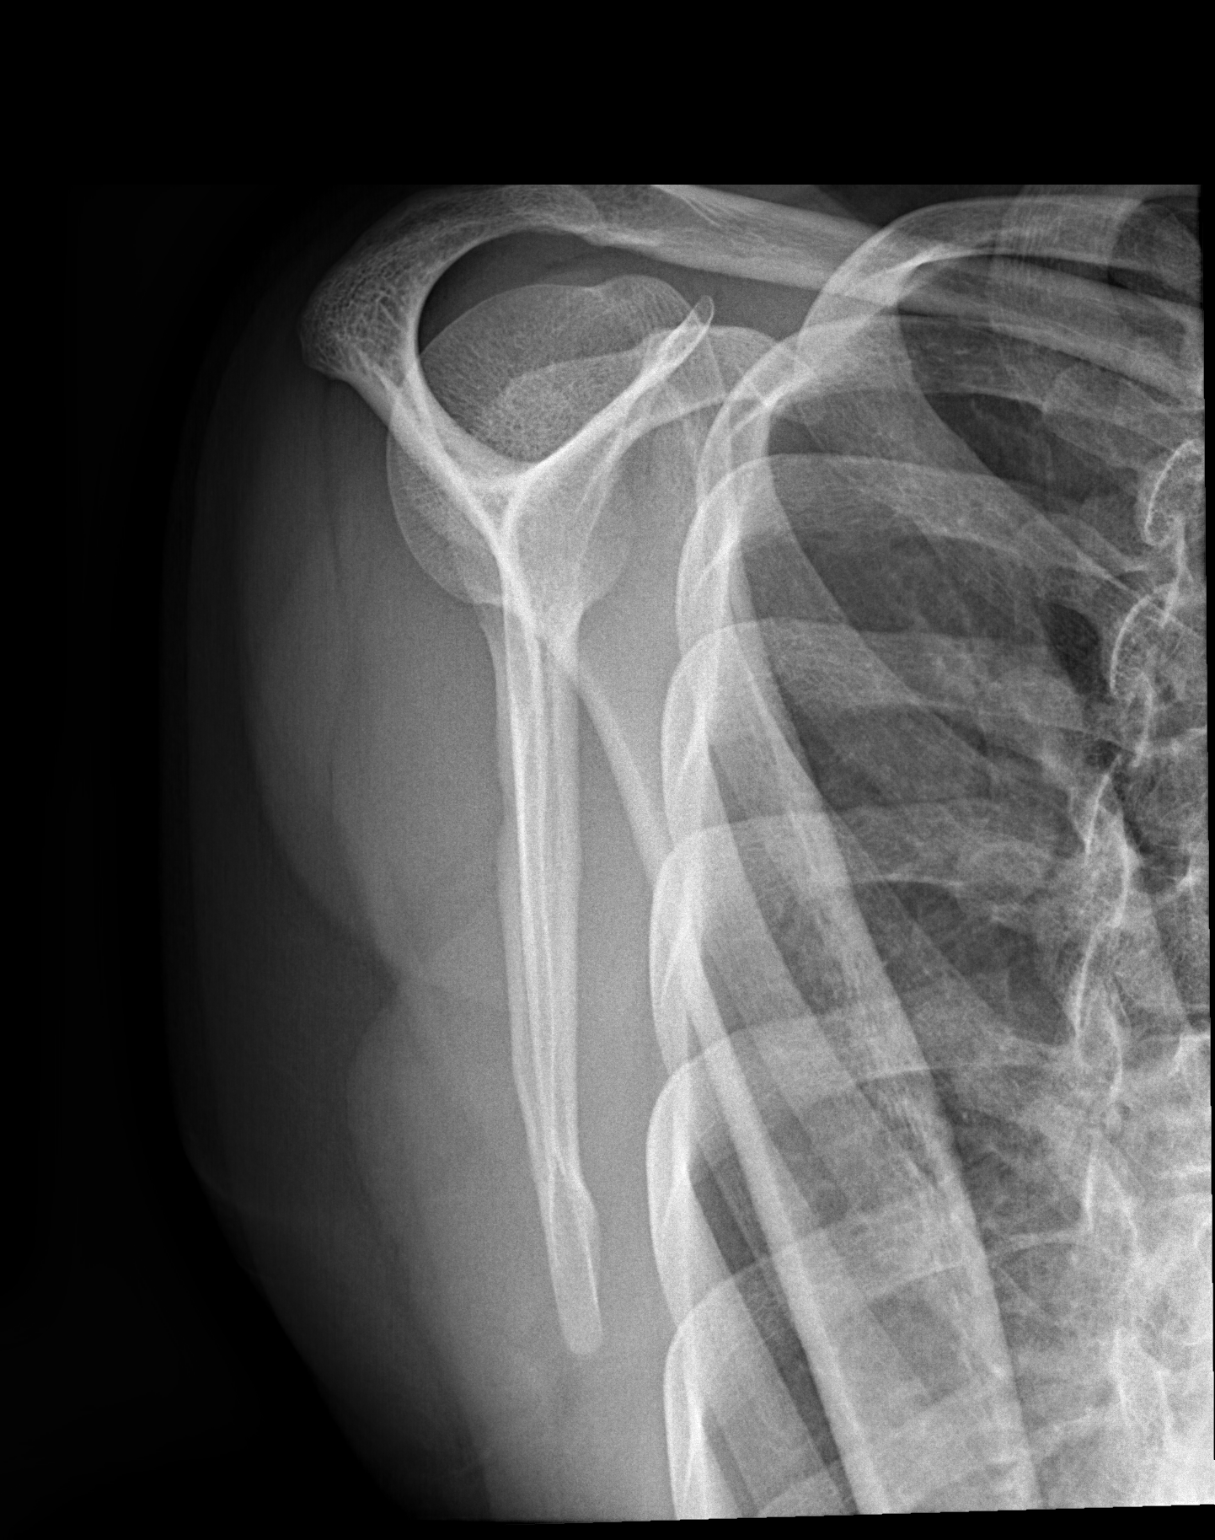

[x shoulder ap right]
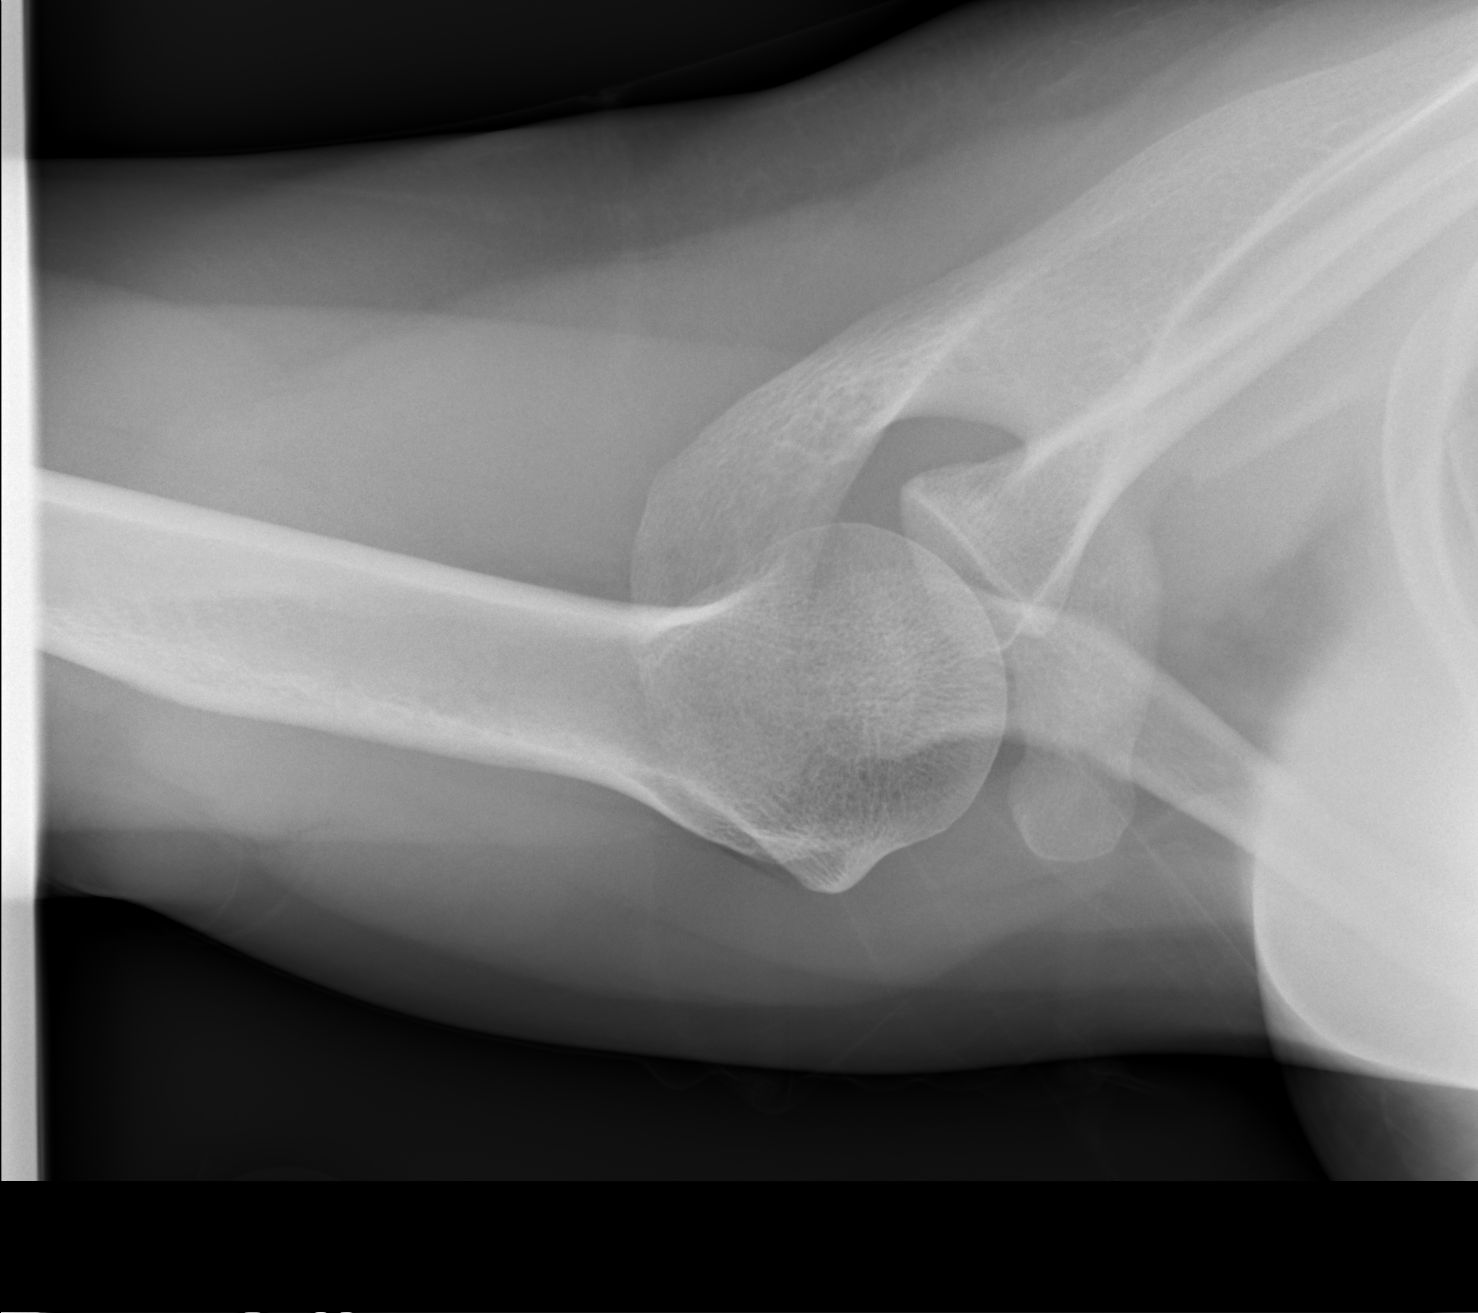

[3 of 3 positions shown; findings below may reference images not displayed]

FINDINGS: Alignment is normal. Joint spaces are preserved. No fracture or
dislocation is evident. No soft tissue lesions are seen. No cervical
rib is evident.
IMPRESSION: No abnormalities are identified.

## 2014-01-11 ENCOUNTER — Other Ambulatory Visit: Payer: Self-pay | Admitting: Internal Medicine

## 2014-02-15 ENCOUNTER — Emergency Department (HOSPITAL_COMMUNITY)
Admission: EM | Admit: 2014-02-15 | Discharge: 2014-02-15 | Disposition: A | Discharge status: Home / Self Care | Payer: Self-pay | Attending: Emergency Medicine | Admitting: Emergency Medicine

## 2014-02-15 ENCOUNTER — Encounter (HOSPITAL_COMMUNITY): Payer: Self-pay | Admitting: Emergency Medicine

## 2014-02-15 ENCOUNTER — Emergency Department (HOSPITAL_COMMUNITY): Payer: Self-pay

## 2014-02-15 DIAGNOSIS — R1013 Epigastric pain: Secondary | ICD-10-CM

## 2014-02-15 DIAGNOSIS — R110 Nausea: Secondary | ICD-10-CM

## 2014-02-15 DIAGNOSIS — Z7952 Long term (current) use of systemic steroids: Secondary | ICD-10-CM | POA: Insufficient documentation

## 2014-02-15 DIAGNOSIS — Z8719 Personal history of other diseases of the digestive system: Secondary | ICD-10-CM | POA: Insufficient documentation

## 2014-02-15 DIAGNOSIS — J45909 Unspecified asthma, uncomplicated: Secondary | ICD-10-CM | POA: Insufficient documentation

## 2014-02-15 DIAGNOSIS — F329 Major depressive disorder, single episode, unspecified: Secondary | ICD-10-CM | POA: Insufficient documentation

## 2014-02-15 DIAGNOSIS — Z87891 Personal history of nicotine dependence: Secondary | ICD-10-CM | POA: Insufficient documentation

## 2014-02-15 DIAGNOSIS — Z79899 Other long term (current) drug therapy: Secondary | ICD-10-CM | POA: Insufficient documentation

## 2014-02-15 DIAGNOSIS — Z7951 Long term (current) use of inhaled steroids: Secondary | ICD-10-CM | POA: Insufficient documentation

## 2014-02-15 DIAGNOSIS — Z862 Personal history of diseases of the blood and blood-forming organs and certain disorders involving the immune mechanism: Secondary | ICD-10-CM | POA: Insufficient documentation

## 2014-02-15 DIAGNOSIS — Z3202 Encounter for pregnancy test, result negative: Secondary | ICD-10-CM | POA: Insufficient documentation

## 2014-02-15 LAB — URINE MICROSCOPIC-ADD ON

## 2014-02-15 LAB — URINALYSIS, ROUTINE W REFLEX MICROSCOPIC
Bilirubin Urine: NEGATIVE
GLUCOSE, UA: NEGATIVE mg/dL
Hgb urine dipstick: NEGATIVE
KETONES UR: NEGATIVE mg/dL
Nitrite: NEGATIVE
PH: 6 (ref 5.0–8.0)
Protein, ur: NEGATIVE mg/dL
SPECIFIC GRAVITY, URINE: 1.027 (ref 1.005–1.030)
Urobilinogen, UA: 1 mg/dL (ref 0.0–1.0)

## 2014-02-15 LAB — COMPREHENSIVE METABOLIC PANEL
ALBUMIN: 4.6 g/dL (ref 3.5–5.2)
ALK PHOS: 46 U/L (ref 39–117)
ALT: 25 U/L (ref 0–35)
ANION GAP: 7 (ref 5–15)
AST: 26 U/L (ref 0–37)
BILIRUBIN TOTAL: 1.1 mg/dL (ref 0.3–1.2)
BUN: 11 mg/dL (ref 6–23)
CO2: 24 mmol/L (ref 19–32)
CREATININE: 0.79 mg/dL (ref 0.50–1.10)
Calcium: 9.3 mg/dL (ref 8.4–10.5)
Chloride: 108 mEq/L (ref 96–112)
GLUCOSE: 77 mg/dL (ref 70–99)
Potassium: 3.5 mmol/L (ref 3.5–5.1)
Sodium: 139 mmol/L (ref 135–145)
Total Protein: 8.3 g/dL (ref 6.0–8.3)

## 2014-02-15 LAB — CBC WITH DIFFERENTIAL/PLATELET
BASOS PCT: 1 % (ref 0–1)
Basophils Absolute: 0 10*3/uL (ref 0.0–0.1)
EOS PCT: 3 % (ref 0–5)
Eosinophils Absolute: 0.2 10*3/uL (ref 0.0–0.7)
HCT: 41.4 % (ref 36.0–46.0)
HEMOGLOBIN: 13.8 g/dL (ref 12.0–15.0)
Lymphocytes Relative: 39 % (ref 12–46)
Lymphs Abs: 2 10*3/uL (ref 0.7–4.0)
MCH: 26.7 pg (ref 26.0–34.0)
MCHC: 33.3 g/dL (ref 30.0–36.0)
MCV: 80.1 fL (ref 78.0–100.0)
Monocytes Absolute: 0.3 10*3/uL (ref 0.1–1.0)
Monocytes Relative: 6 % (ref 3–12)
NEUTROS PCT: 51 % (ref 43–77)
Neutro Abs: 2.6 10*3/uL (ref 1.7–7.7)
Platelets: 256 10*3/uL (ref 150–400)
RBC: 5.17 MIL/uL — AB (ref 3.87–5.11)
RDW: 13.7 % (ref 11.5–15.5)
WBC: 5.1 10*3/uL (ref 4.0–10.5)

## 2014-02-15 LAB — POC URINE PREG, ED: Preg Test, Ur: NEGATIVE

## 2014-02-15 LAB — LIPASE, BLOOD: LIPASE: 38 U/L (ref 11–59)

## 2014-02-15 MED ORDER — FAMOTIDINE 20 MG PO TABS
20.0000 mg | ORAL_TABLET | Freq: Two times a day (BID) | ORAL | Status: DC
Start: 2014-02-15 — End: 2014-04-24

## 2014-02-15 MED ORDER — ONDANSETRON HCL 4 MG/2ML IJ SOLN
4.0000 mg | Freq: Once | INTRAMUSCULAR | Status: DC
  Filled 2014-02-15: qty 2

## 2014-02-15 MED ORDER — SODIUM CHLORIDE 0.9 % IV BOLUS (SEPSIS): 1000.0000 mL | Freq: Once | INTRAVENOUS | Status: DC

## 2014-02-15 MED ORDER — GI COCKTAIL ~~LOC~~
30.0000 mL | Freq: Once | ORAL | Status: AC
  Administered 2014-02-15: 30 mL via ORAL
  Filled 2014-02-15: qty 30

## 2014-02-15 MED ORDER — PANTOPRAZOLE SODIUM 40 MG PO TBEC
40.0000 mg | DELAYED_RELEASE_TABLET | Freq: Once | ORAL | Status: AC
  Administered 2014-02-15: 40 mg via ORAL
  Filled 2014-02-15: qty 1

## 2014-02-15 MED ORDER — SUCRALFATE 1 G PO TABS: 1.0000 g | ORAL_TABLET | Freq: Three times a day (TID) | ORAL | Status: DC

## 2014-02-15 NOTE — ED Notes (Signed)
Pt refusing IV 

## 2014-02-15 NOTE — ED Notes (Signed)
Pt c/o medial lower abdominal pain, diarrhea and nausea onset Monday, denies emesis.

## 2014-02-15 NOTE — ED Provider Notes (Signed)
CSN: [id number]     Arrival date & time 02/15/14  1332 History   First MD Initiated Contact with Patient 02/15/14 1501     Chief Complaint  Patient presents with  . Abdominal Pain  . Nausea     (Consider location/radiation/quality/duration/timing/severity/associated sxs/prior Treatment) Patient is a 24 y.o. female presenting with abdominal pain. The history is provided by the patient.  Abdominal Pain Pain location:  Epigastric Pain quality: aching and gnawing   Pain radiates to:  Does not radiate Pain severity:  Mild Onset quality:  Gradual Duration:  4 days Timing:  Constant Progression:  Unchanged Context: not alcohol use and not diet changes   Relieved by:  None tried Worsened by:  Nothing tried Ineffective treatments:  None tried Associated symptoms: nausea   Associated symptoms: no anorexia, no belching, no chest pain, no chills, no constipation, no cough, no diarrhea, no dysuria, no fatigue, no fever, no flatus, no hematemesis, no hematochezia, no hematuria, no melena, no shortness of breath, no sore throat, no vaginal bleeding, no vaginal discharge and no vomiting     Past Medical History  Diagnosis Date  . Asthma   . Depression     h/o pp depression after 1st pregnancy  . GERD (gastroesophageal reflux disease)   . Anemia 2010   Past Surgical History  Procedure Laterality Date  . Wisdom tooth extraction    . Cesarean section  11/01/2008  . Cesarean section N/A 06/29/2012    Procedure: CESAREAN SECTION;  Surgeon: Melina Schools, MD;  Location: [facility];  Service: Obstetrics;  Laterality: N/A;  1 1/2 hrs OR time    Family History  Problem Relation Age of Onset  . Other Neg Hx   . Asthma Mother   . Arthritis Mother   . Diabetes Mother   . Hypertension Mother   . Hypertension Father   . Asthma Sister   . Allergies Mother   . Allergies Son   . Asthma Son    History  Substance Use Topics  . Smoking status: Former Smoker -- 0.50 packs/day for 6 years    Types:  Cigarettes    Quit date: 11/04/2011  . Smokeless tobacco: Never Used  . Alcohol Use: No   OB History    Gravida Para Term Preterm AB TAB SAB Ectopic Multiple Living   2 2 2  0 0 0 0 0 0 2     Review of Systems  Constitutional: Negative for fever, chills and fatigue.  HENT: Negative for sore throat.   Eyes: Negative for visual disturbance.  Respiratory: Negative for cough and shortness of breath.   Cardiovascular: Negative for chest pain.  Gastrointestinal: Positive for nausea and abdominal pain. Negative for vomiting, diarrhea, constipation, melena, hematochezia, anorexia, flatus and hematemesis.  Endocrine: Negative for polyphagia and polyuria.  Genitourinary: Negative for dysuria, hematuria, vaginal bleeding and vaginal discharge.  Allergic/Immunologic: Negative for immunocompromised state.  Neurological: Negative for dizziness and weakness.    All other systems negative except as documented in the HPI. All pertinent positives and negatives as reviewed in the HPI.   Allergies  Review of patient's allergies indicates no known allergies.  Home Medications   Prior to Admission medications   Medication Sig Start Date End Date Taking? Authorizing Provider  albuterol (PROVENTIL HFA;VENTOLIN HFA) 108 (90 BASE) MCG/ACT inhaler Inhale 2 puffs into the lungs every 6 (six) hours as needed for wheezing or shortness of breath. 03/16/13  Yes Lorayne Marek, MD  mometasone-formoterol (DULERA) 200-5 MCG/ACT AERO  Inhale 2 puffs into the lungs 2 (two) times daily. 08/02/13  Yes Tresa Garter, MD  Cetirizine HCl (ZYRTEC ALLERGY) 10 MG CAPS 1 tablet daily For 5 days Patient not taking: Reported on 02/15/2014 03/31/13   Reyne Dumas, MD  levofloxacin (LEVAQUIN) 500 MG tablet Take 1 tablet (500 mg total) by mouth daily. Patient not taking: Reported on 02/15/2014 03/31/13   Reyne Dumas, MD  metroNIDAZOLE (FLAGYL) 500 MG tablet Take 1 tablet (500 mg total) by mouth 2 (two) times daily. Patient not  taking: Reported on 02/15/2014 08/23/13   Osborne Oman, MD  naproxen sodium (ANAPROX DS) 550 MG tablet Take 1 tablet (550 mg total) by mouth 2 (two) times daily with a meal. Patient not taking: Reported on 02/15/2014 07/02/12   Cheri Fowler, MD  predniSONE (DELTASONE) 20 MG tablet Take 2 tablets (40 mg total) by mouth daily. Patient not taking: Reported on 02/15/2014 03/31/13   Reyne Dumas, MD  sertraline (ZOLOFT) 25 MG tablet Take 1 tablet (25 mg total) by mouth daily. Patient not taking: Reported on 02/15/2014 07/02/12   Cheri Fowler, MD   BP 115/70 mmHg  Pulse 85  Temp(Src) 98.3 F (36.8 C) (Oral)  Resp 20  SpO2 100%  LMP 01/19/2014 (Exact Date)  Breastfeeding? No Physical Exam  Constitutional: She is oriented to person, place, and time. She appears well-developed and well-nourished. No distress.  HENT:  Head: Normocephalic and atraumatic.  Mouth/Throat: Oropharynx is clear and moist.  Eyes: Pupils are equal, round, and reactive to light.  Neck: Normal range of motion. Neck supple.  Cardiovascular: Normal rate, regular rhythm and normal heart sounds.  Exam reveals no gallop and no friction rub.   No murmur heard. Pulmonary/Chest: Effort normal and breath sounds normal. No respiratory distress.  Abdominal: Soft. Normal appearance and bowel sounds are normal. She exhibits no distension. There is tenderness in the epigastric area. There is no rebound and no guarding.    Neurological: She is alert and oriented to person, place, and time. She exhibits normal muscle tone. Coordination normal.  Skin: Skin is warm and dry. No rash noted. No erythema.  Nursing note and vitals reviewed.   ED Course  Procedures (including critical care time) Labs Review Labs Reviewed  CBC WITH DIFFERENTIAL - Abnormal; Notable for the following:    RBC 5.17 (*)    All other components within normal limits  URINALYSIS, ROUTINE W REFLEX MICROSCOPIC - Abnormal; Notable for the following:    Leukocytes,  UA SMALL (*)    All other components within normal limits  URINE MICROSCOPIC-ADD ON - Abnormal; Notable for the following:    Squamous Epithelial / LPF FEW (*)    Bacteria, UA FEW (*)    All other components within normal limits  COMPREHENSIVE METABOLIC PANEL  LIPASE, BLOOD  POC URINE PREG, ED    Imaging Review US Abdomen Complete  02/15/2014   CLINICAL DATA:  Abdominal pain and nausea.  EXAM: ULTRASOUND ABDOMEN COMPLETE  COMPARISON:  None.  FINDINGS: Gallbladder: Sonographically normal. No echogenic gallstones or gall sludge. No gallbladder wall thickening or pericholecystic fluid. Negative sonographic Murphy's sign.  Common bile duct: Diameter: Normal in size measuring 1.2 mm in diameter  Liver: Homogeneous hepatic echotexture. No discrete hepatic lesions. No definite evidence of intrahepatic biliary ductal dilatation. No ascites.  IVC: No abnormality visualized.  Pancreas: Limited visualization of the pancreatic head and neck is normal. Visualization of the pancreatic body and tail is obscured by bowel gas.  Spleen:  Normal in size measuring 7.5 cm in length  Right Kidney: Normal cortical thickness, echogenicity and size, measuring 10.9 cm in length. No focal renal lesions. No echogenic renal stones. No urinary obstruction.  Left Kidney: Normal cortical thickness, echogenicity and size, measuring 10.9 cm in length. No focal renal lesions. No echogenic renal stones. No urinary obstruction.  Abdominal aorta: No aneurysm visualized.  Other findings: None.  IMPRESSION: No explanation for patient's abdominal pain. Specifically, no evidence of cholelithiasis or urinary obstruction.   Electronically Signed   By: Sandi Mariscal M.D.   On: 02/15/2014 16:21    Patient be referred to GI.  Told to return here as needed.  Will be given Carafate and Pepcid.  Told to slowly increase her fluid intake, rest as much as possible patient's testing here today was reviewed and there is no significant abnormalities  noted  MDM   Final diagnoses:  None        Brent General, PA-C 02/15/14 1659  Pamella Pert, MD 02/16/14 1210

## 2014-02-15 NOTE — Discharge Instructions (Signed)
Return here as needed. Follow up with the GI doctor provided.

## 2014-02-22 ENCOUNTER — Ambulatory Visit: Payer: Self-pay | Admitting: Internal Medicine

## 2014-02-27 ENCOUNTER — Other Ambulatory Visit: Payer: Self-pay | Admitting: Internal Medicine

## 2014-03-12 ENCOUNTER — Ambulatory Visit: Payer: Self-pay | Attending: Internal Medicine

## 2014-03-12 ENCOUNTER — Ambulatory Visit: Payer: Self-pay

## 2014-03-21 ENCOUNTER — Other Ambulatory Visit: Payer: Self-pay | Admitting: Internal Medicine

## 2014-03-21 MED ORDER — ALBUTEROL SULFATE HFA 108 (90 BASE) MCG/ACT IN AERS: 2.0000 | INHALATION_SPRAY | Freq: Four times a day (QID) | RESPIRATORY_TRACT | Status: DC | PRN

## 2014-04-24 ENCOUNTER — Emergency Department (HOSPITAL_COMMUNITY)
Admission: EM | Admit: 2014-04-24 | Discharge: 2014-04-24 | Disposition: A | Discharge status: Home / Self Care | Payer: Medicaid Other | Attending: Emergency Medicine | Admitting: Emergency Medicine

## 2014-04-24 ENCOUNTER — Encounter (HOSPITAL_COMMUNITY): Payer: Self-pay | Admitting: Emergency Medicine

## 2014-04-24 DIAGNOSIS — O9989 Other specified diseases and conditions complicating pregnancy, childbirth and the puerperium: Secondary | ICD-10-CM | POA: Diagnosis not present

## 2014-04-24 DIAGNOSIS — K219 Gastro-esophageal reflux disease without esophagitis: Secondary | ICD-10-CM | POA: Insufficient documentation

## 2014-04-24 DIAGNOSIS — Z3A01 Less than 8 weeks gestation of pregnancy: Secondary | ICD-10-CM | POA: Diagnosis not present

## 2014-04-24 DIAGNOSIS — Z87891 Personal history of nicotine dependence: Secondary | ICD-10-CM | POA: Insufficient documentation

## 2014-04-24 DIAGNOSIS — O99511 Diseases of the respiratory system complicating pregnancy, first trimester: Secondary | ICD-10-CM | POA: Insufficient documentation

## 2014-04-24 DIAGNOSIS — R197 Diarrhea, unspecified: Secondary | ICD-10-CM | POA: Diagnosis not present

## 2014-04-24 DIAGNOSIS — O212 Late vomiting of pregnancy: Secondary | ICD-10-CM | POA: Insufficient documentation

## 2014-04-24 DIAGNOSIS — Z79899 Other long term (current) drug therapy: Secondary | ICD-10-CM | POA: Insufficient documentation

## 2014-04-24 DIAGNOSIS — J45909 Unspecified asthma, uncomplicated: Secondary | ICD-10-CM | POA: Diagnosis not present

## 2014-04-24 DIAGNOSIS — Z791 Long term (current) use of non-steroidal anti-inflammatories (NSAID): Secondary | ICD-10-CM | POA: Diagnosis not present

## 2014-04-24 DIAGNOSIS — Z3490 Encounter for supervision of normal pregnancy, unspecified, unspecified trimester: Secondary | ICD-10-CM

## 2014-04-24 DIAGNOSIS — Z792 Long term (current) use of antibiotics: Secondary | ICD-10-CM | POA: Insufficient documentation

## 2014-04-24 DIAGNOSIS — Z862 Personal history of diseases of the blood and blood-forming organs and certain disorders involving the immune mechanism: Secondary | ICD-10-CM | POA: Insufficient documentation

## 2014-04-24 DIAGNOSIS — F329 Major depressive disorder, single episode, unspecified: Secondary | ICD-10-CM | POA: Diagnosis not present

## 2014-04-24 DIAGNOSIS — R112 Nausea with vomiting, unspecified: Secondary | ICD-10-CM

## 2014-04-24 LAB — CBC WITH DIFFERENTIAL/PLATELET
BASOS PCT: 0 % (ref 0–1)
Basophils Absolute: 0 10*3/uL (ref 0.0–0.1)
EOS ABS: 0 10*3/uL (ref 0.0–0.7)
Eosinophils Relative: 0 % (ref 0–5)
HCT: 39.1 % (ref 36.0–46.0)
Hemoglobin: 13.8 g/dL (ref 12.0–15.0)
LYMPHS ABS: 0.4 10*3/uL — AB (ref 0.7–4.0)
Lymphocytes Relative: 4 % — ABNORMAL LOW (ref 12–46)
MCH: 28 pg (ref 26.0–34.0)
MCHC: 35.3 g/dL (ref 30.0–36.0)
MCV: 79.5 fL (ref 78.0–100.0)
Monocytes Absolute: 0.4 10*3/uL (ref 0.1–1.0)
Monocytes Relative: 4 % (ref 3–12)
Neutro Abs: 9.1 10*3/uL — ABNORMAL HIGH (ref 1.7–7.7)
Neutrophils Relative %: 92 % — ABNORMAL HIGH (ref 43–77)
PLATELETS: 180 10*3/uL (ref 150–400)
RBC: 4.92 MIL/uL (ref 3.87–5.11)
RDW: 13.9 % (ref 11.5–15.5)
WBC: 9.9 10*3/uL (ref 4.0–10.5)

## 2014-04-24 LAB — COMPREHENSIVE METABOLIC PANEL
ALBUMIN: 4.5 g/dL (ref 3.5–5.2)
ALK PHOS: 45 U/L (ref 39–117)
ALT: 21 U/L (ref 0–35)
AST: 22 U/L (ref 0–37)
Anion gap: 14 (ref 5–15)
BILIRUBIN TOTAL: 1.5 mg/dL — AB (ref 0.3–1.2)
BUN: 13 mg/dL (ref 6–23)
CHLORIDE: 107 mmol/L (ref 96–112)
CO2: 18 mmol/L — ABNORMAL LOW (ref 19–32)
CREATININE: 0.76 mg/dL (ref 0.50–1.10)
Calcium: 9.5 mg/dL (ref 8.4–10.5)
GFR calc Af Amer: >90 mL/min (ref >90)
GLUCOSE: 106 mg/dL — AB (ref 70–99)
Potassium: 3.4 mmol/L — ABNORMAL LOW (ref 3.5–5.1)
Sodium: 139 mmol/L (ref 135–145)
Total Protein: 7.9 g/dL (ref 6.0–8.3)

## 2014-04-24 LAB — OB RESULTS CONSOLE RUBELLA ANTIBODY, IGM: RUBELLA: IMMUNE

## 2014-04-24 LAB — HCG, QUANTITATIVE, PREGNANCY: hCG, Beta Chain, Quant, S: 9657 m[IU]/mL — ABNORMAL HIGH (ref <5)

## 2014-04-24 LAB — LIPASE, BLOOD: Lipase: 25 U/L (ref 11–59)

## 2014-04-24 LAB — URINALYSIS, ROUTINE W REFLEX MICROSCOPIC
Bilirubin Urine: NEGATIVE
GLUCOSE, UA: NEGATIVE mg/dL
Hgb urine dipstick: NEGATIVE
LEUKOCYTES UA: NEGATIVE
Nitrite: NEGATIVE
PH: 7 (ref 5.0–8.0)
Protein, ur: NEGATIVE mg/dL
SPECIFIC GRAVITY, URINE: 1.028 (ref 1.005–1.030)
Urobilinogen, UA: 0.2 mg/dL (ref 0.0–1.0)

## 2014-04-24 LAB — OB RESULTS CONSOLE ABO/RH: RH Type: POSITIVE

## 2014-04-24 LAB — PREGNANCY, URINE: PREG TEST UR: POSITIVE — AB

## 2014-04-24 LAB — OB RESULTS CONSOLE GC/CHLAMYDIA
CHLAMYDIA, DNA PROBE: NEGATIVE
Gonorrhea: NEGATIVE

## 2014-04-24 LAB — OB RESULTS CONSOLE RPR: RPR: NONREACTIVE

## 2014-04-24 LAB — OB RESULTS CONSOLE HIV ANTIBODY (ROUTINE TESTING): HIV: NONREACTIVE

## 2014-04-24 LAB — OB RESULTS CONSOLE ANTIBODY SCREEN: ANTIBODY SCREEN: NEGATIVE

## 2014-04-24 LAB — OB RESULTS CONSOLE HEPATITIS B SURFACE ANTIGEN: Hepatitis B Surface Ag: NEGATIVE

## 2014-04-24 MED ORDER — PRENATAL COMPLETE 14-0.4 MG PO TABS: 1.0000 | ORAL_TABLET | Freq: Every day | ORAL | Status: DC

## 2014-04-24 MED ORDER — SODIUM CHLORIDE 0.9 % IV BOLUS (SEPSIS)
1000.0000 mL | Freq: Once | INTRAVENOUS | Status: AC
  Administered 2014-04-24: 1000 mL via INTRAVENOUS

## 2014-04-24 MED ORDER — METOCLOPRAMIDE HCL 5 MG/ML IJ SOLN
10.0000 mg | Freq: Once | INTRAMUSCULAR | Status: AC
  Administered 2014-04-24: 10 mg via INTRAMUSCULAR
  Filled 2014-04-24: qty 2

## 2014-04-24 MED ORDER — METOCLOPRAMIDE HCL 5 MG/ML IJ SOLN: 10.0000 mg | INTRAMUSCULAR | Status: DC

## 2014-04-24 MED ORDER — PROMETHAZINE HCL 25 MG/ML IJ SOLN
12.5000 mg | Freq: Once | INTRAMUSCULAR | Status: AC
  Administered 2014-04-24: 12.5 mg via INTRAMUSCULAR
  Filled 2014-04-24: qty 1

## 2014-04-24 NOTE — ED Notes (Signed)
Attempt made to draw pt blood.  Unsuccessful, Rn notified.

## 2014-04-24 NOTE — ED Notes (Signed)
Writer provided pt with a cup of ginger ale for fluid challenge

## 2014-04-24 NOTE — ED Provider Notes (Signed)
CSN: [id number]     Arrival date & time 04/24/14  0241 History   First MD Initiated Contact with Patient 04/24/14 [phone]     Chief Complaint  Patient presents with  . Morning Sickness    (Consider location/radiation/quality/duration/timing/severity/associated sxs/prior Treatment) HPI Comments: Patient is a 24 year old [id number] female who presents to the emergency department for further evaluation of nausea, vomiting, and diarrhea that began at 1900 yesterday. Patient states that she has had approximately 15-20 episodes of emesis. She took Zofran for symptoms without relief. Patient has also had 3-4 episodes of watery, nonbloody diarrhea. She reports diffuse abdominal pain with no associated fever, hematemesis, melena or hematochezia, dysuria, hematuria, or vaginal bleeding. She reports taking a positive pregnancy test last week. LMP 03/16/2014. Abdominal surgical hx significant for C-section x 2.  The history is provided by the patient. No language interpreter was used.    Past Medical History  Diagnosis Date  . Asthma   . Depression     h/o pp depression after 1st pregnancy  . GERD (gastroesophageal reflux disease)   . Anemia 2010   Past Surgical History  Procedure Laterality Date  . Wisdom tooth extraction    . Cesarean section  11/01/2008  . Cesarean section N/A 06/29/2012    Procedure: CESAREAN SECTION;  Surgeon: Melina Schools, MD;  Location: [facility];  Service: Obstetrics;  Laterality: N/A;  1 1/2 hrs OR time    Family History  Problem Relation Age of Onset  . Other Neg Hx   . Asthma Mother   . Arthritis Mother   . Diabetes Mother   . Hypertension Mother   . Hypertension Father   . Asthma Sister   . Allergies Mother   . Allergies Son   . Asthma Son    History  Substance Use Topics  . Smoking status: Former Smoker -- 0.50 packs/day for 6 years    Types: Cigarettes    Quit date: 11/04/2011  . Smokeless tobacco: Never Used  . Alcohol Use: No   OB History    Gravida  Para Term Preterm AB TAB SAB Ectopic Multiple Living   3 2 2  0 0 0 0 0 0 2      Review of Systems  Constitutional: Negative for fever.  Gastrointestinal: Positive for nausea, vomiting, abdominal pain and diarrhea.  Genitourinary: Negative for dysuria, hematuria, vaginal bleeding and vaginal discharge.  All other systems reviewed and are negative.   Allergies  Review of patient's allergies indicates no known allergies.  Home Medications   Prior to Admission medications   Medication Sig Start Date End Date Taking? Authorizing Provider  albuterol (VENTOLIN HFA) 108 (90 BASE) MCG/ACT inhaler Inhale 2 puffs into the lungs every 6 (six) hours as needed for wheezing or shortness of breath. 03/21/14  Yes Tresa Garter, MD  mometasone-formoterol (DULERA) 200-5 MCG/ACT AERO Inhale 2 puffs into the lungs 2 (two) times daily. 08/02/13  Yes Tresa Garter, MD  Cetirizine HCl (ZYRTEC ALLERGY) 10 MG CAPS 1 tablet daily For 5 days Patient not taking: Reported on 02/15/2014 03/31/13   Reyne Dumas, MD  famotidine (PEPCID) 20 MG tablet Take 1 tablet (20 mg total) by mouth 2 (two) times daily. Patient not taking: Reported on 04/24/2014 02/15/14   Dalia Heading, PA-C  levofloxacin (LEVAQUIN) 500 MG tablet Take 1 tablet (500 mg total) by mouth daily. Patient not taking: Reported on 02/15/2014 03/31/13   Reyne Dumas, MD  metroNIDAZOLE (FLAGYL) 500 MG tablet Take 1 tablet (500  mg total) by mouth 2 (two) times daily. Patient not taking: Reported on 02/15/2014 08/23/13   Osborne Oman, MD  naproxen sodium (ANAPROX DS) 550 MG tablet Take 1 tablet (550 mg total) by mouth 2 (two) times daily with a meal. Patient not taking: Reported on 02/15/2014 07/02/12   Cheri Fowler, MD  predniSONE (DELTASONE) 20 MG tablet Take 2 tablets (40 mg total) by mouth daily. Patient not taking: Reported on 02/15/2014 03/31/13   Reyne Dumas, MD  sertraline (ZOLOFT) 25 MG tablet Take 1 tablet (25 mg total) by mouth  daily. Patient not taking: Reported on 02/15/2014 07/02/12   Cheri Fowler, MD  sucralfate (CARAFATE) 1 G tablet Take 1 tablet (1 g total) by mouth 4 (four) times daily -  with meals and at bedtime. Patient not taking: Reported on 04/24/2014 02/15/14   Dalia Heading, PA-C   BP 114/67 mmHg  Pulse 91  Temp(Src) 97.6 F (36.4 C) (Oral)  Resp 18  SpO2 99%  LMP 03/16/2014   Physical Exam  Constitutional: She is oriented to person, place, and time. She appears well-developed and well-nourished. No distress.  Patient appears uncomfortable, but nontoxic/nonseptic appearing  HENT:  Head: Normocephalic and atraumatic.  Mouth/Throat: No oropharyngeal exudate.  Dry mm  Eyes: Conjunctivae and EOM are normal. No scleral icterus.  Neck: Normal range of motion.  Cardiovascular: Normal rate, regular rhythm and intact distal pulses.   Pulmonary/Chest: Effort normal. No respiratory distress. She has no wheezes. She has no rales.  Respirations even and unlabored. Lungs clear.  Abdominal: Soft. She exhibits no distension. There is tenderness. There is no rebound and no guarding.  Soft, diffusely tender abdomen. No areas of focal tenderness. No distention or masses. No peritoneal signs.  Musculoskeletal: Normal range of motion.  Neurological: She is alert and oriented to person, place, and time. She exhibits normal muscle tone. Coordination normal.  GCS 15.  Skin: Skin is warm and dry. No rash noted. She is not diaphoretic. No erythema. No pallor.  Psychiatric: She has a normal mood and affect. Her behavior is normal.  Nursing note and vitals reviewed.   ED Course  Procedures (including critical care time) Labs Review Labs Reviewed  COMPREHENSIVE METABOLIC PANEL - Abnormal; Notable for the following:    Potassium 3.4 (*)    CO2 18 (*)    Glucose, Bld 106 (*)    Total Bilirubin 1.5 (*)    All other components within normal limits  URINALYSIS, ROUTINE W REFLEX MICROSCOPIC - Abnormal; Notable  for the following:    Ketones, ur >80 (*)    All other components within normal limits  PREGNANCY, URINE - Abnormal; Notable for the following:    Preg Test, Ur POSITIVE (*)    All other components within normal limits  HCG, QUANTITATIVE, PREGNANCY - Abnormal; Notable for the following:    hCG, Beta Chain, Quant, S 9657 (*)    All other components within normal limits  CBC WITH DIFFERENTIAL/PLATELET - Abnormal; Notable for the following:    Neutrophils Relative % 92 (*)    Neutro Abs 9.1 (*)    Lymphocytes Relative 4 (*)    Lymphs Abs 0.4 (*)    All other components within normal limits  LIPASE, BLOOD  CBC WITH DIFFERENTIAL/PLATELET    Imaging Review No results found.   EKG Interpretation None      MDM   Final diagnoses:  Nausea vomiting and diarrhea  Pregnancy    24 year old female prevents to the emergency  department for further evaluation of nausea, vomiting, and diarrhea. Patient is approximately [redacted] weeks pregnant. She has diffuse abdominal tenderness with no focal tenderness in her lower abdomen. This is stable on reexamination x 2. No complaints of vaginal bleeding. I suspect that her abdominal discomfort is likely secondary to dehydration and her persistent emesis/diarrhea, c/w viral gastroenteritis.  Patient initially refused IV 2 and opted for IM nausea medication; however, she continues to have discomfort and nausea and is very slow at tolerating oral fluids. Clinically, the patient appears dehydrated with dry mucous membranes. Her urinalysis has >80 ketones. Patient, on their discussion, has agreed to attempt IV placement for IVF hydration.  Patient signed out to [facility], PA-C at shift change who will follow-up on patient after she has been better hydrated. I have discussed with the patient the need to f/u with the [facility] in 48 hours for recheck of symptoms.   Filed Vitals:   04/24/14 0245 04/24/14 0443 04/24/14 0551  BP: 120/79 109/62  114/67  Pulse: 109 74 91  Temp: 97.6 F (36.4 C)    TempSrc: Oral    Resp: 22 18 18   SpO2: 100% 100% 99%      Antonietta Breach, PA-C 04/24/14 [place], MD 04/24/14 [phone]

## 2014-04-24 NOTE — ED Provider Notes (Signed)
PROGRESS NOTE                                                                                                                 This is a sign-out from [place] at shift change: [name] is a 24 y.o. female presenting with nausea vomiting diarrhea and diffuse abdominal pain. Patient has either weeks pregnant. Patient is nauseous and clinically dehydrated in addition she has ketones in her urine. Initially patient refused IV fluids, she was tachycardic however this is resolved. Patient is tolerating by mouth and amenable to IV fluids at this time. Plan is to liter bolus and rechecked. Please refer to previous note for full HPI, ROS, PMH and PE.   Patient seen and evaluated the bedside, she is resting comfortably, IV fluids are flowing. She states that the nausea is improving. Patient confirms that she has had multiple episodes of nausea, vomiting, diarrhea she denies fever, chills, focal abdominal pain she states that it's diffuse and mild. She denies any abnormal vaginal discharge, vaginal bleeding. States that she realized she was pregnant approximately one week ago, she has not started prenatal vitamins. I've offered this patient ultrasound as her beta hCG is well over the lower limit of detection. Patient has declined. Offered pelvic to r/o pathology that cab threaten the viability of the pregnancy and pt again declines. States she will follow with her OB/GYN next week.  I have advised her she will need to follow in the 28 hours, recommend [facility] if she cannot get into her Ob/GynAbdominal exam is nonsurgical, lung sounds are clear to auscultation bilaterally. Patient has no tachycardia, regular rate and rhythm.  Evaluation does not show pathology that would require ongoing emergent intervention or inpatient treatment. Pt is hemodynamically stable and mentating appropriately. Discussed findings and plan with patient/guardian, who agrees with care plan. All questions answered. Return precautions  discussed and outpatient follow up given.   Filed Vitals:   04/24/14 0245 04/24/14 0443 04/24/14 0551 04/24/14 0703  BP: 120/79 109/62 114/67 119/61  Pulse: 109 74 91 81  Temp: 97.6 F (36.4 C)     TempSrc: Oral     Resp: 22 18 18 18   SpO2: 100% 100% 99% 99%     New Prescriptions   PRENATAL VIT-FE FUMARATE-FA (PRENATAL COMPLETE) 14-0.4 MG TABS    Take 1 tablet by mouth daily.       Monico Blitz, PA-C 04/24/14 0998  Linton Flemings, MD 04/26/14 [phone]

## 2014-04-24 NOTE — Discharge Instructions (Signed)
Follow with OB/GYN as soon as possible.   Do NOT take any NSAIDs, such as Aspirin, Motrin, Ibuprofen, Aleve, Naproxen etc. Only take Tylenol for pain. Return to the emergency room  for any severe abdominal pain, increasing vaginal bleeding, passing out or repeated vomiting.   Please go to the [facility] office in [facility] to apply for coverage. Alternatively, you can could go to the [place] office in [facility] to apply for emergency coverage.   Do not hesitate to return to the emergency room for any new, worsening or concerning symptoms.  Please obtain primary care using resource guide below. But the minute you were seen in the emergency room and that they will need to obtain records for further outpatient management.     Emergency Department Resource Guide 1) Find a Doctor and Pay Out of Pocket Although you won't have to find out who is covered by your insurance plan, it is a good idea to ask around and get recommendations. You will then need to call the office and see if the doctor you have chosen will accept you as a new patient and what types of options they offer for patients who are self-pay. Some doctors offer discounts or will set up payment plans for their patients who do not have insurance, but you will need to ask so you aren't surprised when you get to your appointment.  2) Contact Your Local Health Department Not all health departments have doctors that can see patients for sick visits, but many do, so it is worth a call to see if yours does. If you don't know where your local health department is, you can check in your phone book. The CDC also has a tool to help you locate your state's health department, and many state websites also have listings of all of their local health departments.  3) Find a [facility] If your illness is not likely to be very severe or complicated, you may want to try a walk in clinic. These are popping up all over the country in pharmacies, drugstores, and  shopping centers. They're usually staffed by nurse practitioners or physician assistants that have been trained to treat common illnesses and complaints. They're usually fairly quick and inexpensive. However, if you have serious medical issues or chronic medical problems, these are probably not your best option.  No Primary Care Doctor: - Call Health Connect at  [phone] - they can help you locate a primary care doctor that  accepts your insurance, provides certain services, etc. - Physician Referral Service- [phone]  Chronic Pain Problems: Organization         Address  Phone   Notes  [facility]  [phone] Patients need to be referred by their primary care doctor.   Medication Assistance: Organization         Address  Phone   Notes  [facility] Medication [facility] [address] [place], [place] [address] [phone] --Must be a resident of [facility] -- Must have NO insurance coverage whatsoever (no Medicaid/ Medicare, etc.) -- The pt. MUST have a primary care doctor that directs their care regularly and follows them in the community   MedAssist  [phone]   [employer]  [phone]    Agencies that provide inexpensive medical care: Organization         Address  Phone   Notes  [place]  [phone]   [facility]    (  [phone]   [facility] [place], [place] [address] [phone]   [place] [address] [place] [phone]   Planned Parenthood    [phone]   [facility]    [phone]   [place] and [place] [address], [place] Phone:  [phone], Fax:  [phone] Hours of Operation:  9 am - 6 pm, M-F.  Also accepts Medicaid/Medicare and self-pay.  [facility] for [address], [address], [place] Phone: [phone],  Fax: [phone]. Hours of Operation:  8:30 am - 5:30 pm, M-F.  Also accepts Medicaid and self-pay.  [facility] [place] [address] [place] Phone: [phone]   [place], [place], [place] [phone], Ext. [phone] Mondays & Thursdays: 7-9 AM.  First 15 patients are seen on a first come, first serve basis.    [place] Providers:  Organization         Address  Phone   Notes  [facility] [address], [place] [phone] Also accepts self-pay patients.  [facility] [id number] [place], [place]  [phone]   [place], [address], [place] [phone]   [facility] [address] [place] [phone]   Lucianne Lei [address], [place]   [phone] Only accepts [place] Access [place] patients after they have their name applied to their card.   Self-Pay (no insurance) in [facility]:  Organization         Address  Phone   Notes  Sickle Cell Patients, [facility] [place] [phone]   [facility] [place] [phone]   [facility] [place]  [place], [place], [place] [phone]   [facility]/Dr. Osei-Bonsu  [address] [place] or [address], [place] [phone] Phone number for both [place] and [place] locations is the same.  [facility] [address], [place] [phone]   [facility] [address] [place] or [address] [phone]   [facility] [address] [place] [phone], phone; [phone], fax Sees patients 1st and 3rd Saturday of every month.  Must not qualify for public or private  insurance (i.e. Medicaid, Medicare, [place] Health Choice, Veterans' Benefits)  Household income should be no more than 200% of the poverty level The clinic cannot treat you if you are pregnant or think you are pregnant  Sexually transmitted diseases are not treated at the clinic.    Dental Care: Organization         Address  Phone  Notes  [facility] Department of [facility] [place] [phone] Accepts children up to age 68 who are enrolled in [place] or [place]; pregnant women with a Medicaid card; and children who have applied for Medicaid or [place] Health Choice, but were declined, whose parents can pay a reduced fee at time of service.  [facility] Department of [facility]  [address], [place] [phone] Accepts children up  to age 23 who are enrolled in Medicaid or [place] Health Choice; pregnant women with a Medicaid card; and children who have applied for Medicaid or [place] Health Choice, but were declined, whose parents can pay a reduced fee at time of service.  [place] Adult Dental Access PROGRAM  [place] [phone] Patients are seen by appointment only. Walk-ins are not accepted. [place] will see patients 26 years of age and older. Monday - Tuesday (8am-5pm) Most Wednesdays (8:30-5pm) $30 per visit, cash only  [facility] Adult Dental Access PROGRAM  [address], [facility] [phone] Patients are seen by appointment only. Walk-ins are not accepted. [place] will see patients 6 years of age and older. One Wednesday Evening (Monthly: Volunteer Based).  $30 per visit, cash only  [place]  [phone] for adults; Children under age 34, call Graduate Pediatric Dentistry at [phone]. Children aged 51-14, please call [phone] to request a pediatric application.  Dental services are provided in all areas of dental care  including fillings, crowns and bridges, complete and partial dentures, implants, gum treatment, root canals, and extractions. Preventive care is also provided. Treatment is provided to both adults and children. Patients are selected via a lottery and there is often a waiting list.   [facility] [address], [place]  [phone] [email or web address]   [facility] [address] [place], [place] [phone], Ext. [phone] Second and Fourth Thursday of each month, opens at 6:30 AM; Clinic ends at 9 AM.  Patients are seen on a first-come first-served basis, and a limited number are seen during each clinic.   [facility]  [address] Hillard Danker [place], [place] [phone]   Eligibility Requirements You must have lived in [place], [place], or [place] counties for at least the last three months.   You cannot be eligible for state or federal sponsored [employer], including [employer], [place], or [employer].   You generally cannot be eligible for healthcare insurance through your employer.    How to apply: Eligibility screenings are held every Tuesday and Wednesday afternoon from 1:00 pm until 4:00 pm. You do not need an appointment for the interview!  [facility] [address] [place], [place]   [place]  [place] Department  [place]  [phone]    Behavioral Health Resources in the Community: Intensive Outpatient Programs Organization         Address  Phone  Notes  [place] [address] [place], [place] [phone]   [facility] Outpatient [address] [place], [place]   ADS: Alcohol [facility] Drug Svcs [address] [place], [place]   [place] [address]  [place], [place] or [phone]     Substance Abuse Resources Organization         Address  Phone  Notes  Alcohol and Drug Services  [phone]   [place]  [phone]   [facility] [place]   Chinita Pester  [phone]   Residential & Outpatient Substance Abuse Program  [phone]   Psychological Services Organization         Address  Phone  Notes  Dent  [place]  [place]   [place]  Health [address], [place] or [phone]    Mobile Crisis Teams Organization         Address  Phone  Notes  [facility], Mobile Crisis Care Unit  [phone]   [facility]  [address] [place], [place]   [place] [address], [place] [phone]    Self-Help/Support Groups Organization         Address  Phone             Notes  [place]. of [place] - variety of support groups  [place] Call for more information  Narcotics Anonymous (NA), [facility] [address], [employer] [place]  2 meetings at this location   Special educational needs teacher         Address  Phone  Notes  ASAP Residential Treatment [place],    [place]  [phone]   [facility]  [address] [place] [address], [place], [place]   [place], [place] [phone] Admissions: 8am-3pm M-F  Incentives Substance [place] [address]    [place], [place] [phone]   [facility] [address] [place], [place], [place]   [facility] [address],  [place], [place]   [facility] Programs - Intensive Outpatient [address]een [address], [place], [place]   [facility] ([place].,  [place], [place] [phone] or [phone]   Residential Treatment  Services (RTS) [address], [place], [place] Accepts Medicaid  [facility] [place] [address],  [place] [phone] Substance Abuse/Addiction Treatment   [facility] Organization         Address  Phone  Notes  [facility]  [phone]   Domenic Schwab, PhD [address] Arlis Porta [place], [place]   [phone] or [phone]   [place], [place] [phone]   [facility] [address], [place], [place] [phone] Insurance/Medicaid/sponsorship through [facility] and Families [address]                                    [place], [place] [phone] [place] [address] [place], [place] [phone]    Dr. Adele Schilder  [phone]   [facility] Dept. 1) [address], [place] 2) [place] 3)  [place] [address], [place] [phone]  [phone]   [place] [phone] or [phone] (After Hours)

## 2014-04-24 NOTE — ED Notes (Signed)
Patient is currently sleeping.

## 2014-04-24 NOTE — ED Notes (Signed)
Pt states she ate [employer] and pizza today, began having nausea/vomiting. Pt reports she is [redacted] weeks pregnant and just found out a week ago. Pt reports taking Zofran with no relief.

## 2014-05-17 ENCOUNTER — Other Ambulatory Visit (HOSPITAL_COMMUNITY): Payer: Self-pay | Admitting: Nurse Practitioner

## 2014-05-17 DIAGNOSIS — Z3682 Encounter for antenatal screening for nuchal translucency: Secondary | ICD-10-CM

## 2014-06-03 ENCOUNTER — Encounter (HOSPITAL_COMMUNITY): Payer: Self-pay

## 2014-06-03 ENCOUNTER — Inpatient Hospital Stay (HOSPITAL_COMMUNITY)
Admission: AD | Admit: 2014-06-03 | Discharge: 2014-06-03 | Disposition: A | Discharge status: Home / Self Care | Payer: Medicaid Other | Source: Ambulatory Visit | Attending: Obstetrics and Gynecology | Admitting: Obstetrics and Gynecology

## 2014-06-03 DIAGNOSIS — Z87891 Personal history of nicotine dependence: Secondary | ICD-10-CM | POA: Diagnosis not present

## 2014-06-03 DIAGNOSIS — O210 Mild hyperemesis gravidarum: Secondary | ICD-10-CM | POA: Insufficient documentation

## 2014-06-03 DIAGNOSIS — Z3A11 11 weeks gestation of pregnancy: Secondary | ICD-10-CM

## 2014-06-03 DIAGNOSIS — O219 Vomiting of pregnancy, unspecified: Secondary | ICD-10-CM

## 2014-06-03 LAB — CBC
HCT: 36.7 % (ref 36.0–46.0)
HEMOGLOBIN: 12.7 g/dL (ref 12.0–15.0)
MCH: 27 pg (ref 26.0–34.0)
MCHC: 34.6 g/dL (ref 30.0–36.0)
MCV: 78.1 fL (ref 78.0–100.0)
Platelets: 250 10*3/uL (ref 150–400)
RBC: 4.7 MIL/uL (ref 3.87–5.11)
RDW: 13.5 % (ref 11.5–15.5)
WBC: 8.1 10*3/uL (ref 4.0–10.5)

## 2014-06-03 LAB — COMPREHENSIVE METABOLIC PANEL
ALT: 18 U/L (ref 14–54)
AST: 21 U/L (ref 15–41)
Albumin: 3.8 g/dL (ref 3.5–5.0)
Alkaline Phosphatase: 41 U/L (ref 38–126)
Anion gap: 8 (ref 5–15)
BUN: 10 mg/dL (ref 6–20)
CO2: 25 mmol/L (ref 22–32)
Calcium: 9.4 mg/dL (ref 8.9–10.3)
Chloride: 103 mmol/L (ref 101–111)
Creatinine, Ser: 0.53 mg/dL (ref 0.44–1.00)
GFR calc non Af Amer: >60 mL/min (ref >60)
Glucose, Bld: 89 mg/dL (ref 70–99)
Potassium: 4.2 mmol/L (ref 3.5–5.1)
Sodium: 136 mmol/L (ref 135–145)
Total Bilirubin: 0.6 mg/dL (ref 0.3–1.2)
Total Protein: 7.3 g/dL (ref 6.5–8.1)

## 2014-06-03 LAB — URINALYSIS, ROUTINE W REFLEX MICROSCOPIC
BILIRUBIN URINE: NEGATIVE
Glucose, UA: NEGATIVE mg/dL
HGB URINE DIPSTICK: NEGATIVE
Ketones, ur: NEGATIVE mg/dL
Leukocytes, UA: NEGATIVE
Nitrite: NEGATIVE
Protein, ur: NEGATIVE mg/dL
Specific Gravity, Urine: 1.025 (ref 1.005–1.030)
UROBILINOGEN UA: 0.2 mg/dL (ref 0.0–1.0)
pH: 6.5 (ref 5.0–8.0)

## 2014-06-03 MED ORDER — LACTATED RINGERS IV BOLUS (SEPSIS)
1000.0000 mL | Freq: Once | INTRAVENOUS | Status: AC
  Administered 2014-06-03: 1000 mL via INTRAVENOUS

## 2014-06-03 MED ORDER — PROMETHAZINE HCL 25 MG PO TABS: 12.5000 mg | ORAL_TABLET | Freq: Four times a day (QID) | ORAL | Status: DC | PRN

## 2014-06-03 MED ORDER — PROMETHAZINE HCL 25 MG/ML IJ SOLN
25.0000 mg | Freq: Once | INTRAMUSCULAR | Status: AC
  Administered 2014-06-03: 25 mg via INTRAVENOUS
  Filled 2014-06-03: qty 1

## 2014-06-03 NOTE — Discharge Instructions (Signed)
Nausea medication to take during pregnancy:   Unisom (doxylamine succinate 25 mg tablets) Take one tablet daily at bedtime. If symptoms are not adequately controlled, the dose can be increased to a maximum recommended dose of two tablets daily (1/2 tablet in the morning, 1/2 tablet mid-afternoon and one at bedtime).  Vitamin B6 100mg  tablets. Take one tablet twice a day (up to 200 mg per day).  Add Phenergan as prescribed to take as needed.   Nausea/Vomiting of Pregnancy Morning sickness is when you feel sick to your stomach (nauseous) during pregnancy. This nauseous feeling may or may not come with vomiting. It often occurs in the morning but can be a problem any time of day. Morning sickness is most common during the first trimester, but it may continue throughout pregnancy. While morning sickness is unpleasant, it is usually harmless unless you develop severe and continual vomiting (hyperemesis gravidarum). This condition requires more intense treatment.  CAUSES  The cause of morning sickness is not completely known but seems to be related to normal hormonal changes that occur in pregnancy. RISK FACTORS You are at greater risk if you:  Experienced nausea or vomiting before your pregnancy.  Had morning sickness during a previous pregnancy.  Are pregnant with more than one baby, such as twins. TREATMENT  Do not use any medicines (prescription, over-the-counter, or herbal) for morning sickness without first talking to your health care provider. Your health care provider may prescribe or recommend:  Vitamin B6 supplements.  Anti-nausea medicines.  The herbal medicine ginger. HOME CARE INSTRUCTIONS   Only take over-the-counter or prescription medicines as directed by your health care provider.  Taking multivitamins before getting pregnant can prevent or decrease the severity of morning sickness in most women.  Eat a piece of dry toast or unsalted crackers before getting out of bed in  the morning.  Eat five or six small meals a day.  Eat dry and bland foods (rice, baked potato). Foods high in carbohydrates are often helpful.  Do not drink liquids with your meals. Drink liquids between meals.  Avoid greasy, fatty, and spicy foods.  Get someone to cook for you if the smell of any food causes nausea and vomiting.  If you feel nauseous after taking prenatal vitamins, take the vitamins at night or with a snack.  Snack on protein foods (nuts, yogurt, cheese) between meals if you are hungry.  Eat unsweetened gelatins for desserts.  Wearing an acupressure wristband (worn for sea sickness) may be helpful.  Acupuncture may be helpful.  Do not smoke.  Get a humidifier to keep the air in your house free of odors.  Get plenty of fresh air. SEEK MEDICAL CARE IF:   Your home remedies are not working, and you need medicine.  You feel dizzy or lightheaded.  You are losing weight. SEEK IMMEDIATE MEDICAL CARE IF:   You have persistent and uncontrolled nausea and vomiting.  You pass out (faint). MAKE SURE YOU:  Understand these instructions.  Will watch your condition.  Will get help right away if you are not doing well or get worse. Document Released: 03/12/2006 Document Revised: 01/24/2013 Document Reviewed: 07/06/2012 [facility] Patient Information 2015 [place], [place]. This information is not intended to replace advice given to you by your health care provider. Make sure you discuss any questions you have with your health care provider.

## 2014-06-03 NOTE — MAU Provider Note (Signed)
Chief Complaint: Hyperemesis Gravidarum   First Provider Initiated Contact with Patient 06/03/14 1834      SUBJECTIVE HPI: [name] is a 24 y.o. [id number] at [redacted]w[redacted]d by LMP who presents to maternity admissions reporting nausea with vomiting daily x 2 weeks with onset of loose stools today. She reports diarrhea x 1 and vomiting x 2 in last 24 hours.  She feels weak and dizzy with onset today. She reports she is trying to eat and drink regularly but has not been able to keep anything down today.  She denies abdominal pain, vaginal bleeding, vaginal itching/burning, urinary symptoms, h/a, dizziness, n/v, or fever/chills.     Emesis  This is a recurrent problem. The current episode started 1 to 4 weeks ago. The problem occurs less than 2 times per day. The problem has been gradually worsening. The emesis has an appearance of stomach contents. There has been no fever. Associated symptoms include diarrhea, dizziness and headaches. Pertinent negatives include no abdominal pain, chest pain, chills, coughing or fever.    Past Medical History  Diagnosis Date  . Asthma   . Depression     h/o pp depression after 1st pregnancy  . GERD (gastroesophageal reflux disease)   . Anemia 2010   Past Surgical History  Procedure Laterality Date  . Wisdom tooth extraction    . Cesarean section  11/01/2008  . Cesarean section N/A 06/29/2012    Procedure: CESAREAN SECTION;  Surgeon: Melina Schools, MD;  Location: [facility];  Service: Obstetrics;  Laterality: N/A;  1 1/2 hrs OR time    History   Social History  . Marital Status: Single    Spouse Name: N/A  . Number of Children: N/A  . Years of Education: N/A   Occupational History  . Not on file.   Social History Main Topics  . Smoking status: Former Smoker -- 0.50 packs/day for 6 years    Types: Cigarettes    Quit date: 04/14/2014  . Smokeless tobacco: Never Used  . Alcohol Use: No  . Drug Use: Yes    Special: Marijuana     Comment: last use March  2016  . Sexual Activity: Yes    Birth Control/ Protection: None     Comment: pt will consider using condoms   Other Topics Concern  . Not on file   Social History Narrative   No current facility-administered medications on file prior to encounter.   Current Outpatient Prescriptions on File Prior to Encounter  Medication Sig Dispense Refill  . albuterol (VENTOLIN HFA) 108 (90 BASE) MCG/ACT inhaler Inhale 2 puffs into the lungs every 6 (six) hours as needed for wheezing or shortness of breath. 3 Inhaler 3  . mometasone-formoterol (DULERA) 200-5 MCG/ACT AERO Inhale 2 puffs into the lungs 2 (two) times daily. 3 Inhaler 3  . Prenatal Vit-Fe Fumarate-FA (PRENATAL COMPLETE) 14-0.4 MG TABS Take 1 tablet by mouth daily. 60 each 1  . [DISCONTINUED] sucralfate (CARAFATE) 1 G tablet Take 1 tablet (1 g total) by mouth 4 (four) times daily -  with meals and at bedtime. (Patient not taking: Reported on 04/24/2014) 28 tablet 0   No Known Allergies  Review of Systems  Constitutional: Negative for fever, chills and malaise/fatigue.  Eyes: Negative for blurred vision.  Respiratory: Negative for cough and shortness of breath.   Cardiovascular: Negative for chest pain.  Gastrointestinal: Positive for vomiting and diarrhea. Negative for heartburn, nausea and abdominal pain.  Genitourinary: Negative for dysuria, urgency and frequency.  Musculoskeletal: Negative.   Neurological: Positive for dizziness and headaches.  Psychiatric/Behavioral: Negative for depression.    OBJECTIVE Height 5\' 6"  (1.676 m), weight 66.679 kg (147 lb), last menstrual period 03/16/2014, not currently breastfeeding. GENERAL: Well-developed, well-nourished female in no acute distress.  EYES: normal sclera/conjunctiva; no lid-lag HENT: Atraumatic, normocephalic HEART: normal rate RESP: normal effort ABDOMEN: Soft, non-tender MUSCULOSKELETAL: Normal ROM EXTREMITIES: Nontender, no edema NEURO/PSYCH: Alert and oriented,  appropriate affect   FHT 150s by doppler  LAB RESULTS Results for orders placed or performed during the hospital encounter of 06/03/14 (from the past 24 hour(s))  Urinalysis, Routine w reflex microscopic     Status: Abnormal   Collection Time: 06/03/14  5:50 PM  Result Value Ref Range   Color, Urine YELLOW YELLOW   APPearance HAZY (A) CLEAR   Specific Gravity, Urine 1.025 1.005 - 1.030   pH 6.5 5.0 - 8.0   Glucose, UA NEGATIVE NEGATIVE mg/dL   Hgb urine dipstick NEGATIVE NEGATIVE   Bilirubin Urine NEGATIVE NEGATIVE   Ketones, ur NEGATIVE NEGATIVE mg/dL   Protein, ur NEGATIVE NEGATIVE mg/dL   Urobilinogen, UA 0.2 0.0 - 1.0 mg/dL   Nitrite NEGATIVE NEGATIVE   Leukocytes, UA NEGATIVE NEGATIVE  CBC     Status: None   Collection Time: 06/03/14  7:08 PM  Result Value Ref Range   WBC 8.1 4.0 - 10.5 K/uL   RBC 4.70 3.87 - 5.11 MIL/uL   Hemoglobin 12.7 12.0 - 15.0 g/dL   HCT 36.7 36.0 - 46.0 %   MCV 78.1 78.0 - 100.0 fL   MCH 27.0 26.0 - 34.0 pg   MCHC 34.6 30.0 - 36.0 g/dL   RDW 13.5 11.5 - 15.5 %   Platelets 250 150 - 400 K/uL  Comprehensive metabolic panel     Status: None   Collection Time: 06/03/14  7:08 PM  Result Value Ref Range   Sodium 136 135 - 145 mmol/L   Potassium 4.2 3.5 - 5.1 mmol/L   Chloride 103 101 - 111 mmol/L   CO2 25 22 - 32 mmol/L   Glucose, Bld 89 70 - 99 mg/dL   BUN 10 6 - 20 mg/dL   Creatinine, Ser 0.53 0.44 - 1.00 mg/dL   Calcium 9.4 8.9 - 10.3 mg/dL   Total Protein 7.3 6.5 - 8.1 g/dL   Albumin 3.8 3.5 - 5.0 g/dL   AST 21 15 - 41 U/L   ALT 18 14 - 54 U/L   Alkaline Phosphatase 41 38 - 126 U/L   Total Bilirubin 0.6 0.3 - 1.2 mg/dL   GFR calc non Af Amer >60 >60 mL/min   GFR calc Af Amer >60 >60 mL/min   Anion gap 8 5 - 15   MAU MDM U/A with no evidence of dehydration but pt symptomatic so CBC and CMP ordered and pt given LR x 1000 ml and Phenergan 25 mg IV push. Pt reported improvement in nausea and headache almost gone after IV fluids and  medication.  ASSESSMENT 1. Nausea and vomiting during pregnancy prior to [redacted] weeks gestation     PLAN Discharge home Discussed OTC Unisom/B6 for nausea Phenergan 12.5-25 mg PO Q 6 hours        Follow-up Information    Please follow up.   Why:  With prenatal care as planned      Follow up with [place].   Why:  As needed for emergencies   Contact information:  [address] [id number] [place] Moosic Garceno Certified Nurse-Midwife 06/03/2014  8:46 PM

## 2014-06-03 NOTE — MAU Note (Signed)
Pt had +upt at [facility] on 04/24/2014.  Has been having nausea and vomiting for the past 4 weeks, pt states she is feeling very weak.  Plans to see [facility].  Pt says she had diarrhea once yesterday.  States no one else is sick around her. Denies any vag bleeding,or  Discharge; did have mild lower abd cramps after vomiting today.

## 2014-06-15 ENCOUNTER — Ambulatory Visit (HOSPITAL_COMMUNITY): Admission: RE | Admit: 2014-06-15 | Payer: Medicaid Other | Source: Ambulatory Visit

## 2014-06-15 ENCOUNTER — Ambulatory Visit (HOSPITAL_COMMUNITY): Payer: Medicaid Other | Attending: Nurse Practitioner

## 2014-06-25 ENCOUNTER — Encounter (HOSPITAL_COMMUNITY): Payer: Self-pay | Admitting: *Deleted

## 2014-06-25 ENCOUNTER — Emergency Department (HOSPITAL_COMMUNITY)
Admission: EM | Admit: 2014-06-25 | Discharge: 2014-06-25 | Disposition: A | Discharge status: Home / Self Care | Payer: Medicaid Other | Source: Home / Self Care | Attending: Emergency Medicine | Admitting: Emergency Medicine

## 2014-06-25 ENCOUNTER — Inpatient Hospital Stay (HOSPITAL_COMMUNITY)
Admission: AD | Admit: 2014-06-25 | Discharge: 2014-06-25 | Disposition: A | Discharge status: Home / Self Care | Payer: Medicaid Other | Source: Ambulatory Visit | Attending: Obstetrics and Gynecology | Admitting: Obstetrics and Gynecology

## 2014-06-25 DIAGNOSIS — J45909 Unspecified asthma, uncomplicated: Secondary | ICD-10-CM | POA: Diagnosis not present

## 2014-06-25 DIAGNOSIS — S81012A Laceration without foreign body, left knee, initial encounter: Secondary | ICD-10-CM

## 2014-06-25 DIAGNOSIS — O9989 Other specified diseases and conditions complicating pregnancy, childbirth and the puerperium: Secondary | ICD-10-CM | POA: Diagnosis present

## 2014-06-25 DIAGNOSIS — W25XXXA Contact with sharp glass, initial encounter: Secondary | ICD-10-CM | POA: Insufficient documentation

## 2014-06-25 DIAGNOSIS — Z7951 Long term (current) use of inhaled steroids: Secondary | ICD-10-CM | POA: Insufficient documentation

## 2014-06-25 DIAGNOSIS — Y9289 Other specified places as the place of occurrence of the external cause: Secondary | ICD-10-CM

## 2014-06-25 DIAGNOSIS — Z3492 Encounter for supervision of normal pregnancy, unspecified, second trimester: Secondary | ICD-10-CM

## 2014-06-25 DIAGNOSIS — Z8659 Personal history of other mental and behavioral disorders: Secondary | ICD-10-CM

## 2014-06-25 DIAGNOSIS — Z79899 Other long term (current) drug therapy: Secondary | ICD-10-CM | POA: Insufficient documentation

## 2014-06-25 DIAGNOSIS — Y9389 Activity, other specified: Secondary | ICD-10-CM

## 2014-06-25 DIAGNOSIS — Z3A14 14 weeks gestation of pregnancy: Secondary | ICD-10-CM | POA: Insufficient documentation

## 2014-06-25 DIAGNOSIS — O9A211 Injury, poisoning and certain other consequences of external causes complicating pregnancy, first trimester: Secondary | ICD-10-CM

## 2014-06-25 DIAGNOSIS — Z23 Encounter for immunization: Secondary | ICD-10-CM

## 2014-06-25 DIAGNOSIS — Z87891 Personal history of nicotine dependence: Secondary | ICD-10-CM | POA: Diagnosis not present

## 2014-06-25 DIAGNOSIS — Y998 Other external cause status: Secondary | ICD-10-CM | POA: Insufficient documentation

## 2014-06-25 DIAGNOSIS — O99511 Diseases of the respiratory system complicating pregnancy, first trimester: Secondary | ICD-10-CM

## 2014-06-25 DIAGNOSIS — Z8719 Personal history of other diseases of the digestive system: Secondary | ICD-10-CM

## 2014-06-25 DIAGNOSIS — Z862 Personal history of diseases of the blood and blood-forming organs and certain disorders involving the immune mechanism: Secondary | ICD-10-CM | POA: Insufficient documentation

## 2014-06-25 DIAGNOSIS — W1802XA Striking against glass with subsequent fall, initial encounter: Secondary | ICD-10-CM | POA: Insufficient documentation

## 2014-06-25 MED ORDER — ACETAMINOPHEN 325 MG PO TABS
650.0000 mg | ORAL_TABLET | Freq: Once | ORAL | Status: AC
  Administered 2014-06-25: 650 mg via ORAL
  Filled 2014-06-25: qty 2

## 2014-06-25 MED ORDER — TETANUS-DIPHTH-ACELL PERTUSSIS 5-2.5-18.5 LF-MCG/0.5 IM SUSP
0.5000 mL | Freq: Once | INTRAMUSCULAR | Status: AC
  Administered 2014-06-25: 0.5 mL via INTRAMUSCULAR
  Filled 2014-06-25: qty 0.5

## 2014-06-25 MED ORDER — TETANUS-DIPHTH-ACELL PERTUSSIS 5-2.5-18.5 LF-MCG/0.5 IM SUSP: 0.5000 mL | Freq: Once | INTRAMUSCULAR | Status: DC

## 2014-06-25 MED ORDER — BUPIVACAINE-EPINEPHRINE (PF) 0.25% -1:200000 IJ SOLN
10.0000 mL | Freq: Once | INTRAMUSCULAR | Status: DC
  Filled 2014-06-25: qty 10

## 2014-06-25 MED ORDER — BUPIVACAINE-EPINEPHRINE (PF) 0.25% -1:200000 IJ SOLN
30.0000 mL | Freq: Once | INTRAMUSCULAR | Status: AC
  Administered 2014-06-25: 30 mL
  Filled 2014-06-25: qty 30

## 2014-06-25 NOTE — Discharge Instructions (Signed)
Laceration Care, Adult Have sutures removed in 7-10 days. Take tylenol for pain.  A laceration is a cut or lesion that goes through all layers of the skin and into the tissue just beneath the skin. TREATMENT  Some lacerations may not require closure. Some lacerations may not be able to be closed due to an increased risk of infection. It is important to see your caregiver as soon as possible after an injury to minimize the risk of infection and maximize the opportunity for successful closure. If closure is appropriate, pain medicines may be given, if needed. The wound will be cleaned to help prevent infection. Your caregiver will use stitches (sutures), staples, wound glue (adhesive), or skin adhesive strips to repair the laceration. These tools bring the skin edges together to allow for faster healing and a better cosmetic outcome. However, all wounds will heal with a scar. Once the wound has healed, scarring can be minimized by covering the wound with sunscreen during the day for 1 full year. HOME CARE INSTRUCTIONS  For sutures or staples:  Keep the wound clean and dry.  If you were given a bandage (dressing), you should change it at least once a day. Also, change the dressing if it becomes wet or dirty, or as directed by your caregiver.  Wash the wound with soap and water 2 times a day. Rinse the wound off with water to remove all soap. Pat the wound dry with a clean towel.  After cleaning, apply a thin layer of the antibiotic ointment as recommended by your caregiver. This will help prevent infection and keep the dressing from sticking.  You may shower as usual after the first 24 hours. Do not soak the wound in water until the sutures are removed.  Only take over-the-counter or prescription medicines for pain, discomfort, or fever as directed by your caregiver.  Get your sutures or staples removed as directed by your caregiver. For skin adhesive strips:  Keep the wound clean and dry.  Do  not get the skin adhesive strips wet. You may bathe carefully, using caution to keep the wound dry.  If the wound gets wet, pat it dry with a clean towel.  Skin adhesive strips will fall off on their own. You may trim the strips as the wound heals. Do not remove skin adhesive strips that are still stuck to the wound. They will fall off in time. For wound adhesive:  You may briefly wet your wound in the shower or bath. Do not soak or scrub the wound. Do not swim. Avoid periods of heavy perspiration until the skin adhesive has fallen off on its own. After showering or bathing, gently pat the wound dry with a clean towel.  Do not apply liquid medicine, cream medicine, or ointment medicine to your wound while the skin adhesive is in place. This may loosen the film before your wound is healed.  If a dressing is placed over the wound, be careful not to apply tape directly over the skin adhesive. This may cause the adhesive to be pulled off before the wound is healed.  Avoid prolonged exposure to sunlight or tanning lamps while the skin adhesive is in place. Exposure to ultraviolet light in the first year will darken the scar.  The skin adhesive will usually remain in place for 5 to 10 days, then naturally fall off the skin. Do not pick at the adhesive film. You may need a tetanus shot if:  You cannot remember when you had  your last tetanus shot.  You have never had a tetanus shot. If you get a tetanus shot, your arm may swell, get red, and feel warm to the touch. This is common and not a problem. If you need a tetanus shot and you choose not to have one, there is a rare chance of getting tetanus. Sickness from tetanus can be serious. SEEK MEDICAL CARE IF:   You have redness, swelling, or increasing pain in the wound.  You see a red line that goes away from the wound.  You have yellowish-white fluid (pus) coming from the wound.  You have a fever.  You notice a bad smell coming from the wound  or dressing.  Your wound breaks open before or after sutures have been removed.  You notice something coming out of the wound such as wood or glass.  Your wound is on your hand or foot and you cannot move a finger or toe. SEEK IMMEDIATE MEDICAL CARE IF:   Your pain is not controlled with prescribed medicine.  You have severe swelling around the wound causing pain and numbness or a change in color in your arm, hand, leg, or foot.  Your wound splits open and starts bleeding.  You have worsening numbness, weakness, or loss of function of any joint around or beyond the wound.  You develop painful lumps near the wound or on the skin anywhere on your body. MAKE SURE YOU:   Understand these instructions.  Will watch your condition.  Will get help right away if you are not doing well or get worse. Document Released: 01/19/2005 Document Revised: 04/13/2011 Document Reviewed: 07/15/2010 [facility] Patient Information 2015 [place], [place]. This information is not intended to replace advice given to you by your health care provider. Make sure you discuss any questions you have with your health care provider.  Emergency Department Resource Guide 1) Find a Doctor and Pay Out of Pocket Although you won't have to find out who is covered by your insurance plan, it is a good idea to ask around and get recommendations. You will then need to call the office and see if the doctor you have chosen will accept you as a new patient and what types of options they offer for patients who are self-pay. Some doctors offer discounts or will set up payment plans for their patients who do not have insurance, but you will need to ask so you aren't surprised when you get to your appointment.  2) Contact Your Local Health Department Not all health departments have doctors that can see patients for sick visits, but many do, so it is worth a call to see if yours does. If you don't know where your local health department  is, you can check in your phone book. The CDC also has a tool to help you locate your state's health department, and many state websites also have listings of all of their local health departments.  3) Find a [facility] If your illness is not likely to be very severe or complicated, you may want to try a walk in clinic. These are popping up all over the country in pharmacies, drugstores, and shopping centers. They're usually staffed by nurse practitioners or physician assistants that have been trained to treat common illnesses and complaints. They're usually fairly quick and inexpensive. However, if you have serious medical issues or chronic medical problems, these are probably not your best option.  No Primary Care Doctor: - Call Health Connect at  [phone] -  they can help you locate a primary care doctor that  accepts your insurance, provides certain services, etc. - Physician Referral Service- [phone]  Chronic Pain Problems: Organization         Address  Phone   Notes  [facility]  [phone] Patients need to be referred by their primary care doctor.   Medication Assistance: Organization         Address  Phone   Notes  [facility] Medication [facility] [address] [place], [place] [address] [phone] --Must be a resident of [facility] -- Must have NO insurance coverage whatsoever (no Medicaid/ Medicare, etc.) -- The pt. MUST have a primary care doctor that directs their care regularly and follows them in the community   MedAssist  [phone]   [employer]  [phone]    Agencies that provide inexpensive medical care: Organization         Address  Phone   Notes  [place]  [phone]   [facility]    [phone]   [facility] [place], [place] [address] [phone]   [place] [address], [place]  [phone]   Planned Parenthood    [phone]   [facility]    [phone]   [place] and [place] [address], [place] Phone:  [phone], Fax:  [phone] Hours of Operation:  9 am - 6 pm, M-F.  Also accepts Medicaid/Medicare and self-pay.  [facility] for [place], [address], [place] Phone: [phone], Fax: [phone]. Hours of Operation:  8:30 am - 5:30 pm, M-F.  Also accepts Medicaid and self-pay.  [facility] [place] [address] [place] Phone: [phone]   [place], [place], [place] [phone], Ext. [phone] Mondays & Thursdays: 7-9 AM.  First 15 patients are seen on a first come, first serve basis.    [place] Providers:  Organization         Address  Phone   Notes  [facility] [address], [place] [phone] Also accepts self-pay patients.  [facility] [address], [place]  [phone]   [place], [address], [place] [phone]   [facility] [address], [place] [phone]   Lucianne Lei [address], [place]   [phone] Only accepts [place] Access [place] patients after they have their name applied to their card.   Self-Pay (no insurance) in [facility]:  Organization         Address  Phone   Notes  Sickle Cell Patients, [facility] [place] [phone]   [facility] [place] [phone]   [facility] [place]  [place], [place], [place] [phone]   [facility]/Dr. Osei-Bonsu  [address], [place] or [address], [place] [phone] Phone number for both [place] and [place]  locations is the same.  Urgent Medical and [facility] [address], [place] [phone]   [facility] [place] or [place]  [facility] [address] [phone]   [facility] [place] [phone], phone; [phone], fax Sees patients 1st and 3rd Saturday of every month.  Must not qualify for public or private insurance (i.e. Medicaid, Medicare, [place] Health Choice, Veterans' Benefits)  Household income should be no more than 200% of the poverty level The clinic cannot treat you if you are pregnant or think you are pregnant  Sexually transmitted diseases are not treated at the clinic.    Dental Care: Organization         Address  Phone  Notes  [facility] [place] [phone] Accepts children up to age 41 who are enrolled in [place] or [place]; pregnant women with a Medicaid card; and children who have applied for Medicaid or [place] Health Choice, but were declined, whose parents can pay a reduced fee at time of service.  [facility] Department of [facility]  [address], [place] [phone] Accepts children up to age 21 who are enrolled in [place] or [place]; pregnant women with a Medicaid card; and children who have applied for Medicaid or [place] Health Choice, but were declined, whose parents can pay a reduced fee at time of service.  [place] Adult Dental Access PROGRAM  [place] [phone] Patients are seen by appointment only. Walk-ins are not accepted. [place] will see patients 1 years of age and older. Monday - Tuesday (8am-5pm) Most Wednesdays (8:30-5pm) $30 per visit, cash only  [facility] Adult Dental Access PROGRAM  [address], [facility] [phone] Patients are seen by appointment only. Walk-ins are not accepted. [place]  will see patients 53 years of age and older. One Wednesday Evening (Monthly: Volunteer Based).  $30 per visit, cash only  [place]  [phone] for adults; Children under age 20, call Graduate Pediatric Dentistry at [phone]. Children aged 66-14, please call [phone] to request a pediatric application.  Dental services are provided in all areas of dental care including fillings, crowns and bridges, complete and partial dentures, implants, gum treatment, root canals, and extractions. Preventive care is also provided. Treatment is provided to both adults and children. Patients are selected via a lottery and there is often a waiting list.   [facility] [address] [place]  [phone] [email or web address]   [facility] [address] [place], [place] [phone], Ext. [phone] Second and Fourth Thursday of each month, opens at 6:30 AM; Clinic ends at 9 AM.  Patients are seen on a first-come first-served basis, and a limited number are seen during each clinic.   [facility]  [address] Hillard Danker [place], [place] [phone]   Eligibility Requirements You must have lived in [place], [place], or [place] counties for at least the last three months.   You cannot be eligible for state or federal sponsored [employer], including [employer], [place], or [employer].   You generally cannot be eligible for healthcare insurance through your employer.    How to apply: Eligibility screenings are held every Tuesday and Wednesday afternoon from 1:00 pm until 4:00 pm. You do not need an appointment for the interview!  [facility] [address], [place], [place]   [place]  Assumption  Department  [phone]   [place]  [phone]    Behavioral Health Resources in the Community: Intensive Outpatient  Programs Organization         Address  Phone  Notes  Marlin Purdin. [address] [place], [place] [phone]   [facility] [address] [place], [place]   ADS: Alcohol & Drug Svcs [address] [place], [place]   [place] [address]  [place], [place] or [phone]   Substance Abuse Resources Organization         Address  Phone  Notes  Alcohol and Drug Services  [phone]   [place]  [phone]   The [place]   Chinita Pester  [phone]   Residential & Outpatient Substance Abuse Program  [phone]   Psychological Services Organization         Address  Phone  Notes  [facility]  [place]  [phone]   [place] [address] [place] or [phone]    Mobile Crisis Teams Organization         Address  Phone  Notes  [facility], Mobile Crisis Care Unit  [phone]   [facility]ive Psychotherapeutic Services  [address] [place], [place]   [place] [address], [place] [phone]    Self-Help/Support Groups Organization         Address  Phone             Notes  [place]. of [place] - variety of support groups  [place] Call for more information  Narcotics Anonymous (NA), [facility] [address], [employer] [place]  2 meetings at this location   Special educational needs teacher         Address  Phone  Notes  ASAP Residential Treatment [place],    [place]  [phone]   [facility]  [address] [place] [phone], [place], [place]   [place], [place] [phone] Admissions: 8am-3pm M-F  Incentives Substance [place] [address],    [place], [place]  [phone]   [facility] [address] [place], [place], [place]   [facility] [address],  [place], [place]   [facility]ight Programs - Intensive Outpatient [address] Krist[address], [place], [place]   [facility] [address].,  [place], [place] [phone] or [phone]   Residential Treatment Services (RTS) [address], [place], [place] Accepts Medicaid  [facility] [address],  [place] [phone] Substance Abuse/Addiction Treatment   [facility] Organization         Address  Phone  Notes  [facility]  [phone]   Domenic Schwab, PhD [address] [place], [place]   [phone] or [phone]   [place], [place] [phone]   [place] [address] [place], [place] [phone] Insurance/Medicaid/sponsorship through [employer] and Families [address] VBT [address]  [place], [place] [phone] [place], [place] [phone]    Dr. Adele Schilder  [phone]   [facility] Dept. 1) [address] [place] 2) [place] 3)  [place] [address], [place] [phone]  [phone]   [place] [phone] or [phone] (After Hours)
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
## 2014-06-25 NOTE — Discharge Instructions (Signed)
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
## 2014-06-26 DIAGNOSIS — N760 Acute vaginitis: Secondary | ICD-10-CM | POA: Insufficient documentation

## 2014-06-26 DIAGNOSIS — B9689 Other specified bacterial agents as the cause of diseases classified elsewhere: Secondary | ICD-10-CM | POA: Insufficient documentation

## 2014-08-21 ENCOUNTER — Other Ambulatory Visit: Payer: Self-pay | Admitting: Internal Medicine

## 2014-08-22 ENCOUNTER — Encounter: Payer: Self-pay | Admitting: Internal Medicine

## 2014-08-22 ENCOUNTER — Ambulatory Visit (INDEPENDENT_AMBULATORY_CARE_PROVIDER_SITE_OTHER): Payer: Medicaid Other | Admitting: Internal Medicine

## 2014-08-22 DIAGNOSIS — J4530 Mild persistent asthma, uncomplicated: Secondary | ICD-10-CM

## 2014-08-22 MED ORDER — ALBUTEROL SULFATE HFA 108 (90 BASE) MCG/ACT IN AERS: INHALATION_SPRAY | RESPIRATORY_TRACT | Status: DC

## 2014-08-22 MED ORDER — BUDESONIDE-FORMOTEROL FUMARATE 160-4.5 MCG/ACT IN AERO: INHALATION_SPRAY | RESPIRATORY_TRACT | Status: DC

## 2014-08-22 NOTE — Progress Notes (Signed)
Subjective:    Patient ID: [name], female    DOB: 01-19-1991 MRN: [id number]   Brief patient profile:   24 yobf quit smoking  11/2011 dx asthma age 24 ok until got to college and started smoking on "maint" alb since 2011 referred 06/17/2012 to pulmonary clinic by Dr Ulanda Edison at 38 wk pregnancy p ER eval 03/19/12.   History of Present Illness  06/17/2012 1st pulmonary eval cc last saba 4 h prior to OV  For sob and chest tightness using saba 6-8 x per day with temporary relief only. assos with subjective wheeze. Denies using anything but saba but notes say started on advari Jan 2014 ? Ever took it?  On qvar presently s benefit.  Had nasal congestion relieved with zyrtec and pseudofed So far pregnancy not complicated by hbp or edema. REC Symbicort 160 Take 2 puffs first thing in am and then another 2 puffs about 12 hours later and stop qvar Only use your albuterol (proaire) as a rescue medication  Work on inhaler technique: Please schedule a follow up office visit in 4 weeks, sooner if needed > did not return  Baby born May 28th 2018 planned c section / ran out of symbicort one month p delivery was born     08/22/2014 f/u ov/Wert re: IUP @ 23 weeks quit smoking 04/2014 /  On dulera200 2bid x 2 years/ no need for saba while on dulera  Chief Complaint  Patient presents with  . Acute Visit    Pt c/o increased SOB for the past month. She has also been coughing and wheezing. Cough is non prod.   using saba every 2 hours since ran out of dulera x one month   Including saba rx  3 h prior to OV  cc sob/ wheeze/ dry cough worse at hs   No obvious day to day or daytime variability or assoc chronic cough or cp   or overt sinus or hb symptoms. No unusual exp hx or h/o childhood pna/ asthma or knowledge of premature birth.  Sleeping ok without nocturnal  or early am exacerbation  of respiratory  c/o's or need for noct saba. Also denies any obvious fluctuation of symptoms with weather or environmental  changes or other aggravating or alleviating factors except as outlined above   Current Medications, Allergies, Complete Past Medical History, Past Surgical History, Family History, and Social History were reviewed in [employer] record.  ROS  The following are not active complaints unless bolded sore throat, dysphagia, dental problems, itching, sneezing,  nasal congestion or excess/ purulent secretions, ear ache,   fever, chills, sweats, unintended wt loss, classically pleuritic or exertional cp, hemoptysis,  orthopnea pnd or leg swelling, presyncope, palpitations, abdominal pain, anorexia, nausea, vomiting, diarrhea  or change in bowel or bladder habits, change in stools or urine, dysuria,hematuria,  rash, arthralgias, visual complaints, headache, numbness, weakness or ataxia or problems with walking or coordination,  change in mood/affect or memory.           Objective:   Physical Exam   .08/22/2014       168  Wt Readings from Last 3 Encounters:  06/17/12 180 lb (81.647 kg)  03/19/12 157 lb 3.2 oz (71.305 kg)  02/17/12 143 lb (64.864 kg)   amb pregnant bf nad with nl vital signs    HEENT: nl dentition, turbinates, and orophanx. Nl external ear canals without cough reflex   NECK :  without JVD/Nodes/TM/ nl carotid upstrokes bilaterally  LUNGS: no acc muscle use,  clear to A and P bilaterally without cough on insp or exp maneuvers   CV:  RRR  no s3 or murmur or increase in P2, no edema   ABD:  soft and nontender c/w  approx stated gestation  IUP with nl excursion in the supine position. No bruits or organomegaly, bowel sounds nl  MS:  warm without deformities, calf tenderness, cyanosis or clubbing  SKIN: warm and dry without lesions    NEURO:  alert, approp, no deficits            Assessment & Plan:

## 2014-08-22 NOTE — Patient Instructions (Addendum)
symbicort 160 Take 2 puffs first thing in am and then another 2 puffs about 12 hours later.   Only use your albuterol (proair)as a rescue medication to be used if you can't catch your breath by resting or doing a relaxed purse lip breathing pattern.  - The less you use it, the better it will work when you need it. - Ok to use up to 2 puffs  every 4 hours if you must but call for immediate appointment if use goes up over your usual need - Don't leave home without it !!  (think of it like the spare tire for your car)   GERD (REFLUX)  is an extremely common cause of respiratory symptoms just like yours , many times with no obvious heartburn at all.    It can be treated with medication, but also with lifestyle changes including elevation of the head of your bed (ideally with 6 inch  bed blocks),  Smoking cessation, avoidance of late meals, excessive alcohol, and avoid fatty foods, chocolate, peppermint, colas, red wine, and acidic juices such as orange juice.  NO MINT OR MENTHOL PRODUCTS SO NO COUGH DROPS  USE SUGARLESS CANDY INSTEAD (Jolley ranchers or Stover's or Life Savers) or even ice chips will also do - the key is to swallow to prevent all throat clearing. NO OIL BASED VITAMINS - use powdered substitutes.   Please schedule a follow up office visit in 4 weeks, sooner if needed

## 2014-08-26 ENCOUNTER — Encounter: Payer: Self-pay | Admitting: Internal Medicine

## 2014-08-26 NOTE — Assessment & Plan Note (Signed)
DDX of  difficult airways management all start with A and  include Adherence, Ace Inhibitors, Acid Reflux, Active Sinus Disease, Alpha 1 Antitripsin deficiency, Anxiety masquerading as Airways dz,  ABPA,  allergy(esp in young), Aspiration (esp in elderly), Adverse effects of meds,  Active smokers, A bunch of PE's (a small clot burden can't cause this syndrome unless there is already severe underlying pulm or vascular dz with poor reserve) plus two Bs  = Bronchiectasis and Beta blocker use..and one C= CHF  Adherence is always the initial "prime suspect" and is a multilayered concern that requires a "trust but verify" approach in every patient - starting with knowing how to use medications, especially inhalers, correctly, keeping up with refills and understanding the fundamental difference between maintenance and prns vs those medications only taken for a very short course and then stopped and not refilled.  The proper method of use, as well as anticipated side effects, of a metered-dose inhaler are discussed and demonstrated to the patient. Improved effectiveness after extensive coaching during this visit to a level of approximately  90% > start symbicort 60 2bid   ? Acid (or non-acid) GERD > always difficult to exclude as up to 75% of pts in some series report no assoc GI/ Heartburn symptoms> rec max  diet restrictions/ reviewed and instructions given in writing > defer medical rx to ob if needed    I had an extended discussion with the patient reviewing all relevant studies completed to date and  lasting 15 to 20 minutes of a 25 minute visit    Discussed in detail all the  indications, usual  risks and alternatives  relative to the benefits with patient who agrees to proceed with  symbicort rx with risks of poorly controlled asthma >>> symbicort which has budesonide,, the best rated ICS for pregnancy    Each maintenance medication was reviewed in detail including most importantly the difference  between maintenance and prns and under what circumstances the prns are to be triggered using an action plan format that is not reflected in the computer generated alphabetically organized AVS.    Please see instructions for details which were reviewed in writing and the patient given a copy highlighting the part that I personally wrote and discussed at today's ov.

## 2014-09-02 ENCOUNTER — Inpatient Hospital Stay (HOSPITAL_COMMUNITY)
Admission: AD | Admit: 2014-09-02 | Discharge: 2014-09-02 | Disposition: A | Discharge status: Home / Self Care | Payer: Medicaid Other | Source: Ambulatory Visit | Attending: Obstetrics and Gynecology | Admitting: Obstetrics and Gynecology

## 2014-09-02 ENCOUNTER — Encounter (HOSPITAL_COMMUNITY): Payer: Self-pay | Admitting: *Deleted

## 2014-09-02 DIAGNOSIS — O9989 Other specified diseases and conditions complicating pregnancy, childbirth and the puerperium: Secondary | ICD-10-CM | POA: Diagnosis not present

## 2014-09-02 DIAGNOSIS — B373 Candidiasis of vulva and vagina: Secondary | ICD-10-CM

## 2014-09-02 DIAGNOSIS — R109 Unspecified abdominal pain: Secondary | ICD-10-CM | POA: Diagnosis not present

## 2014-09-02 DIAGNOSIS — Z87891 Personal history of nicotine dependence: Secondary | ICD-10-CM | POA: Insufficient documentation

## 2014-09-02 DIAGNOSIS — Z3A24 24 weeks gestation of pregnancy: Secondary | ICD-10-CM | POA: Diagnosis not present

## 2014-09-02 DIAGNOSIS — O26899 Other specified pregnancy related conditions, unspecified trimester: Secondary | ICD-10-CM

## 2014-09-02 DIAGNOSIS — B3731 Acute candidiasis of vulva and vagina: Secondary | ICD-10-CM

## 2014-09-02 LAB — URINALYSIS, ROUTINE W REFLEX MICROSCOPIC
BILIRUBIN URINE: NEGATIVE
GLUCOSE, UA: NEGATIVE mg/dL
Hgb urine dipstick: NEGATIVE
Ketones, ur: NEGATIVE mg/dL
LEUKOCYTES UA: NEGATIVE
Nitrite: NEGATIVE
PH: 7 (ref 5.0–8.0)
Protein, ur: NEGATIVE mg/dL
SPECIFIC GRAVITY, URINE: 1.02 (ref 1.005–1.030)
Urobilinogen, UA: 1 mg/dL (ref 0.0–1.0)

## 2014-09-02 LAB — WET PREP, GENITAL
CLUE CELLS WET PREP: NONE SEEN
Trich, Wet Prep: NONE SEEN

## 2014-09-02 MED ORDER — TERCONAZOLE 0.4 % VA CREA: 1.0000 | TOPICAL_CREAM | Freq: Every day | VAGINAL | Status: DC

## 2014-09-02 MED ORDER — ALPRAZOLAM 0.25 MG PO TABS
0.2500 mg | ORAL_TABLET | Freq: Once | ORAL | Status: AC
  Administered 2014-09-02: 0.25 mg via ORAL
  Filled 2014-09-02: qty 1

## 2014-09-02 NOTE — MAU Note (Signed)
Packing and lifting last night.  Started having contractions around 0845 this morning, denies vaginal bleeding, d/c .  Last intercourse 1 week.
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
## 2014-09-02 NOTE — Progress Notes (Signed)
Cumi, SW down to talk with patient.  Denies SI or HI, d/c home with plans to see follow up MD's

## 2014-09-02 NOTE — MAU Provider Note (Signed)
History    [name] is a 24 y.o. [phone] at 24.2wks who presents, unannounced, for contractions. Patient states that she called this morning, around 10am, seeking assistance for pain but was told to come in.  Patient did not speak with this provider. Patient states contractions started at 0900 and have been intermittent, but is unsure of the occurrence.  Patient reports lower abdominal cramping, that is menstrual in nature. Patient denies changes or issues with vaginal discharge, bowel movements, or urination. Patient reports sexual intercourse "a week ago or maybe more than that." Patient admits that she has not had anything to drink since last night.  Patient states that she did not take any OTC medication and has history of anxiety, but is not taking any medications.  Patient does go to [facility] for counseling.    Patient Active Problem List   Diagnosis Date Noted  . Pain in joint, shoulder region 12/22/2012  . S/P cesarean section 07/02/2012  . 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 Asthma 02/17/2012  . Nausea/vomiting in pregnancy 11/05/2011    Chief Complaint  Patient presents with  . Contractions   HPI  OB History    Gravida Para Term Preterm AB TAB SAB Ectopic Multiple Living   4 2 2  0 1 1 0 0 0 2      Past Medical History  Diagnosis Date  . Asthma   . Depression     h/o pp depression after 1st pregnancy  . GERD (gastroesophageal reflux disease)   . Anemia 2010    Past Surgical History  Procedure Laterality Date  . Wisdom tooth extraction    . Cesarean section  11/01/2008  . Cesarean section N/A 06/29/2012    Procedure: CESAREAN SECTION;  Surgeon: Melina Schools, MD;  Location: [facility];  Service: Obstetrics;  Laterality: N/A;  1 1/2 hrs OR time     Family History  Problem Relation Age of Onset  . Other Neg Hx   . Asthma Mother   . Arthritis Mother   . Diabetes Mother   . Hypertension Mother   . Hypertension Father   . Asthma Sister   . Allergies Mother   .  Allergies Son   . Asthma Son     History  Substance Use Topics  . Smoking status: Former Smoker -- 0.50 packs/day for 6 years    Types: Cigarettes    Quit date: 04/14/2014  . Smokeless tobacco: Never Used  . Alcohol Use: No    Allergies: No Known Allergies  Prescriptions prior to admission  Medication Sig Dispense Refill Last Dose  . budesonide-formoterol (SYMBICORT) 160-4.5 MCG/ACT inhaler Take 2 puffs first thing in am and then another 2 puffs about 12 hours later. 1 Inhaler 11 09/02/2014 at Unknown time  . Prenatal Vit-Fe Fumarate-FA (PRENATAL COMPLETE) 14-0.4 MG TABS Take 1 tablet by mouth daily. 60 each 1 Past Week at Unknown time  . albuterol (PROAIR HFA) 108 (90 BASE) MCG/ACT inhaler 2 puffs every 4 hours as needed only  if your can't catch your breath 1 Inhaler 1 rescue  . promethazine (PHENERGAN) 25 MG tablet Take 0.5-1 tablets (12.5-25 mg total) by mouth every 6 (six) hours as needed for nausea or vomiting. (Patient not taking: Reported on 09/02/2014) 30 tablet 2 Taking    ROS  See HPI Above Physical Exam   Last menstrual period 03/16/2014.  Results for orders placed or performed during the hospital encounter of 09/02/14 (from the past 24 hour(s))  Wet  prep, genital     Status: Abnormal   Collection Time: 09/02/14  1:31 PM  Result Value Ref Range   Yeast Wet Prep HPF POC FEW (A) NONE SEEN   Trich, Wet Prep NONE SEEN NONE SEEN   Clue Cells Wet Prep HPF POC NONE SEEN NONE SEEN   WBC, Wet Prep HPF POC FEW (A) NONE SEEN    Physical Exam  Constitutional: She is oriented to person, place, and time. She appears well-developed and well-nourished.  HENT:  Head: Normocephalic and atraumatic.  Eyes: EOM are normal. Pupils are equal, round, and reactive to light.  Cardiovascular: Normal rate, regular rhythm and normal heart sounds.   Respiratory: Effort normal and breath sounds normal.  GI: Soft. She exhibits no distension. There is no tenderness. There is no rebound and no  guarding.  Genitourinary: Cervix exhibits discharge. Vaginal discharge found.  Sterile Speculum Exam: -Vulva and Vagina: Thick white discharged noted at introitus and within vault -wet prep collected -Cervix: White discharge from os-Appears closed. GC/CT collected Bimanual Exam: Closed/Long/Thick/Ballotable  Musculoskeletal: Normal range of motion. She exhibits no edema.  Neurological: She is alert and oriented to person, place, and time.  Skin: Skin is warm and dry.  Psychiatric: Her behavior is normal. Her mood appears anxious. Her affect is labile.     FHR: 145 bpm, Mod Var, -Decels, +10x10Accels UC: None graphed or palpated ED Course  Assessment: IUP at 24.2wks Cat I FT Abdominal Cramping  Plan: -PE as above -Labs: Wet prep, Gc/CT, UA -Discussed anxiety, patient tearful.  Denies current stress and declines talking about issues that may be contributing to anxiety.  Reassurances given.  Patient offered anti-anxiety medication.  Accepted after informed would only receive one time dosage -Xanax: 0.25mg    Follow Up (1420) -Wet prep: + yeast -Await UA results; will call if abnormal -Rx for Terazol 7 0.4%. Take at hs for 7 days.   -Educated on vaginal hygiene, proper nutrition, and hydration. No sex until therapy completed -Encouraged to speak with counselor regarding restarting anxiety medication -PTL and Bleeding Precautions -Keep appt as scheduled: August 23 -Encouraged to call if any questions or concerns arise prior to next scheduled office visit.  -Discharged to home in stable condition  Asani Mcburney, Register, MSN 09/02/2014 1:24 PM   Addendum:  Patient seen by social work due to inability to verbalize personal safety.  Reports some stressors r/t FOB leaving and having to move out of current home.

## 2014-09-02 NOTE — Discharge Instructions (Signed)
Second Trimester of Pregnancy The second trimester is from week 13 through week 28, months 4 through 6. The second trimester is often a time when you feel your best. Your body has also adjusted to being pregnant, and you begin to feel better physically. Usually, morning sickness has lessened or quit completely, you may have more energy, and you may have an increase in appetite. The second trimester is also a time when the fetus is growing rapidly. At the end of the sixth month, the fetus is about 9 inches long and weighs about 1 pounds. You will likely begin to feel the baby move (quickening) between 18 and 20 weeks of the pregnancy. BODY CHANGES Your body goes through many changes during pregnancy. The changes vary from woman to woman.   Your weight will continue to increase. You will notice your lower abdomen bulging out.  You may begin to get stretch marks on your hips, abdomen, and breasts.  You may develop headaches that can be relieved by medicines approved by your health care provider.  You may urinate more often because the fetus is pressing on your bladder.  You may develop or continue to have heartburn as a result of your pregnancy.  You may develop constipation because certain hormones are causing the muscles that push waste through your intestines to slow down.  You may develop hemorrhoids or swollen, bulging veins (varicose veins).  You may have back pain because of the weight gain and pregnancy hormones relaxing your joints between the bones in your pelvis and as a result of a shift in weight and the muscles that support your balance.  Your breasts will continue to grow and be tender.  Your gums may bleed and may be sensitive to brushing and flossing.  Dark spots or blotches (chloasma, mask of pregnancy) may develop on your face. This will likely fade after the baby is born.  A dark line from your belly button to the pubic area (linea nigra) may appear. This will likely fade  after the baby is born.  You may have changes in your hair. These can include thickening of your hair, rapid growth, and changes in texture. Some women also have hair loss during or after pregnancy, or hair that feels dry or thin. Your hair will most likely return to normal after your baby is born. WHAT TO EXPECT AT YOUR PRENATAL VISITS During a routine prenatal visit:  You will be weighed to make sure you and the fetus are growing normally.  Your blood pressure will be taken.  Your abdomen will be measured to track your baby's growth.  The fetal heartbeat will be listened to.  Any test results from the previous visit will be discussed. Your health care provider may ask you:  How you are feeling.  If you are feeling the baby move.  If you have had any abnormal symptoms, such as leaking fluid, bleeding, severe headaches, or abdominal cramping.  If you have any questions. Other tests that may be performed during your second trimester include:  Blood tests that check for:  Low iron levels (anemia).  Gestational diabetes (between 24 and 28 weeks).  Rh antibodies.  Urine tests to check for infections, diabetes, or protein in the urine.  An ultrasound to confirm the proper growth and development of the baby.  An amniocentesis to check for possible genetic problems.  Fetal screens for spina bifida and Down syndrome. HOME CARE INSTRUCTIONS   Avoid all smoking, herbs, alcohol, and unprescribed   drugs. These chemicals affect the formation and growth of the baby.  Follow your health care provider's instructions regarding medicine use. There are medicines that are either safe or unsafe to take during pregnancy.  Exercise only as directed by your health care provider. Experiencing uterine cramps is a good sign to stop exercising.  Continue to eat regular, healthy meals.  Wear a good support bra for breast tenderness.  Do not use hot tubs, steam rooms, or saunas.  Wear your  seat belt at all times when driving.  Avoid raw meat, uncooked cheese, cat litter boxes, and soil used by cats. These carry germs that can cause birth defects in the baby.  Take your prenatal vitamins.  Try taking a stool softener (if your health care provider approves) if you develop constipation. Eat more high-fiber foods, such as fresh vegetables or fruit and whole grains. Drink plenty of fluids to keep your urine clear or pale yellow.  Take warm sitz baths to soothe any pain or discomfort caused by hemorrhoids. Use hemorrhoid cream if your health care provider approves.  If you develop varicose veins, wear support hose. Elevate your feet for 15 minutes, 3-4 times a day. Limit salt in your diet.  Avoid heavy lifting, wear low heel shoes, and practice good posture.  Rest with your legs elevated if you have leg cramps or low back pain.  Visit your dentist if you have not gone yet during your pregnancy. Use a soft toothbrush to brush your teeth and be gentle when you floss.  A sexual relationship may be continued unless your health care provider directs you otherwise.  Continue to go to all your prenatal visits as directed by your health care provider. SEEK MEDICAL CARE IF:   You have dizziness.  You have mild pelvic cramps, pelvic pressure, or nagging pain in the abdominal area.  You have persistent nausea, vomiting, or diarrhea.  You have a bad smelling vaginal discharge.  You have pain with urination. SEEK IMMEDIATE MEDICAL CARE IF:   You have a fever.  You are leaking fluid from your vagina.  You have spotting or bleeding from your vagina.  You have severe abdominal cramping or pain.  You have rapid weight gain or loss.  You have shortness of breath with chest pain.  You notice sudden or extreme swelling of your face, hands, ankles, feet, or legs.  You have not felt your baby move in over an hour.  You have severe headaches that do not go away with  medicine.  You have vision changes. Document Released: 01/13/2001 Document Revised: 01/24/2013 Document Reviewed: 03/22/2012 Exit[employer] Patient Information 2015 [employer]. This information is not intended to replace advice given to you by your health care provider. Make sure you discuss any questions you have with your health care provider.  

## 2014-09-03 LAB — GC/CHLAMYDIA PROBE AMP (~~LOC~~) NOT AT ARMC
CHLAMYDIA, DNA PROBE: NEGATIVE
NEISSERIA GONORRHEA: NEGATIVE

## 2014-09-19 ENCOUNTER — Ambulatory Visit: Payer: Medicaid Other | Admitting: Internal Medicine

## 2014-10-04 ENCOUNTER — Inpatient Hospital Stay (HOSPITAL_COMMUNITY)
Admission: AD | Admit: 2014-10-04 | Discharge: 2014-10-04 | Disposition: A | Discharge status: Home / Self Care | Payer: Medicaid Other | Source: Ambulatory Visit | Attending: Obstetrics | Admitting: Obstetrics

## 2014-10-04 ENCOUNTER — Encounter (HOSPITAL_COMMUNITY): Payer: Self-pay | Admitting: *Deleted

## 2014-10-04 DIAGNOSIS — O9989 Other specified diseases and conditions complicating pregnancy, childbirth and the puerperium: Secondary | ICD-10-CM

## 2014-10-04 DIAGNOSIS — Z3A28 28 weeks gestation of pregnancy: Secondary | ICD-10-CM | POA: Insufficient documentation

## 2014-10-04 DIAGNOSIS — O99613 Diseases of the digestive system complicating pregnancy, third trimester: Secondary | ICD-10-CM | POA: Insufficient documentation

## 2014-10-04 DIAGNOSIS — R1013 Epigastric pain: Secondary | ICD-10-CM | POA: Diagnosis present

## 2014-10-04 DIAGNOSIS — Z87891 Personal history of nicotine dependence: Secondary | ICD-10-CM | POA: Insufficient documentation

## 2014-10-04 DIAGNOSIS — A084 Viral intestinal infection, unspecified: Secondary | ICD-10-CM | POA: Insufficient documentation

## 2014-10-04 LAB — URINALYSIS, ROUTINE W REFLEX MICROSCOPIC
Bilirubin Urine: NEGATIVE
GLUCOSE, UA: NEGATIVE mg/dL
HGB URINE DIPSTICK: NEGATIVE
Ketones, ur: NEGATIVE mg/dL
Leukocytes, UA: NEGATIVE
Nitrite: NEGATIVE
PROTEIN: NEGATIVE mg/dL
Specific Gravity, Urine: 1.01 (ref 1.005–1.030)
UROBILINOGEN UA: 0.2 mg/dL (ref 0.0–1.0)
pH: 6 (ref 5.0–8.0)

## 2014-10-04 LAB — WET PREP, GENITAL
Clue Cells Wet Prep HPF POC: NONE SEEN
Trich, Wet Prep: NONE SEEN

## 2014-10-04 NOTE — Discharge Instructions (Signed)

## 2014-10-04 NOTE — MAU Note (Signed)
Pt presents with complaint of a lot of cramping and pressure x 2 hours. Also reports diarrhea for the last 3 days. Today had 4 loose stools.

## 2014-10-04 NOTE — MAU Provider Note (Signed)
History     CSN: [id number]  Arrival date and time: 10/04/14 2131   First Provider Initiated Contact with Patient 10/04/14 2203      No chief complaint on file.  HPI Comments: [name] is a 24 y.o. [phone] at [redacted]w[redacted]d who presents today with intermittent abdominal pain. She states that the pain started around 1900. She denies any vaginal bleeding or LOF. She states that the fetus has been moving normally. She denies any hx of preterm birth with her other pregnancies. She denies any complications with this pregnancy.   Abdominal Pain This is a new problem. The current episode started today (at 1900 ). The problem occurs intermittently. The pain is located in the suprapubic region. The pain is at a severity of 8/10. The quality of the pain is cramping. The abdominal pain radiates to the periumbilical region and epigastric region. Associated symptoms include diarrhea ("looser stools" since yesterday. She thinks it is from something she ate. Denies any sick contacts. ) and nausea. Pertinent negatives include no dysuria, fever, frequency or vomiting. Nothing aggravates the pain. The pain is relieved by nothing. She has tried nothing for the symptoms.     Past Medical History  Diagnosis Date  . Asthma   . Depression     h/o pp depression after 1st pregnancy  . GERD (gastroesophageal reflux disease)   . Anemia 2010    Past Surgical History  Procedure Laterality Date  . Wisdom tooth extraction    . Cesarean section  11/01/2008  . Cesarean section N/A 06/29/2012    Procedure: CESAREAN SECTION;  Surgeon: Melina Schools, MD;  Location: [facility];  Service: Obstetrics;  Laterality: N/A;  1 1/2 hrs OR time     Family History  Problem Relation Age of Onset  . Other Neg Hx   . Asthma Mother   . Arthritis Mother   . Diabetes Mother   . Hypertension Mother   . Hypertension Father   . Asthma Sister   . Allergies Mother   . Allergies Son   . Asthma Son     Social History  Substance Use  Topics  . Smoking status: Former Smoker -- 0.50 packs/day for 6 years    Types: Cigarettes    Quit date: 04/14/2014  . Smokeless tobacco: Never Used  . Alcohol Use: No    Allergies: No Known Allergies  Prescriptions prior to admission  Medication Sig Dispense Refill Last Dose  . albuterol (PROAIR HFA) 108 (90 BASE) MCG/ACT inhaler 2 puffs every 4 hours as needed only  if your can't catch your breath 1 Inhaler 1 rescue  . budesonide-formoterol (SYMBICORT) 160-4.5 MCG/ACT inhaler Take 2 puffs first thing in am and then another 2 puffs about 12 hours later. 1 Inhaler 11 09/02/2014 at Unknown time  . Prenatal Vit-Fe Fumarate-FA (PRENATAL COMPLETE) 14-0.4 MG TABS Take 1 tablet by mouth daily. 60 each 1 Past Week at Unknown time  . terconazole (TERAZOL 7) 0.4 % vaginal cream Place 1 applicator vaginally at bedtime. 45 g 0     Review of Systems  Constitutional: Negative for fever.  Gastrointestinal: Positive for nausea, abdominal pain and diarrhea ("looser stools" since yesterday. She thinks it is from something she ate. Denies any sick contacts. ). Negative for vomiting.  Genitourinary: Negative for dysuria, urgency and frequency.   Physical Exam   Blood pressure 120/62, pulse 98, temperature 98.6 F (37 C), temperature source Oral, resp. rate 16, last menstrual period 03/16/2014, SpO2 100 %.  Physical Exam  Nursing note and vitals reviewed. Constitutional: She appears well-developed and well-nourished. No distress.  Cardiovascular: Normal rate.   Respiratory: Effort normal.  GI: Soft. There is no tenderness. There is no rebound.  Genitourinary:   External: no lesion Vagina: small amount of white discharge Cervix: pink, smooth, closed/thick/high  Uterus:AGA   Skin: Skin is warm and dry.  Psychiatric: She has a normal mood and affect.    FHT 140, mdoerate with 15x15 accels, one variable while laying flat on back.  Toco: rare UC.   Results for orders placed or performed during  the hospital encounter of 10/04/14 (from the past 24 hour(s))  Urinalysis, Routine w reflex microscopic (not at [facility])     Status: None   Collection Time: 10/04/14 10:35 PM  Result Value Ref Range   Color, Urine YELLOW YELLOW   APPearance CLEAR CLEAR   Specific Gravity, Urine 1.010 1.005 - 1.030   pH 6.0 5.0 - 8.0   Glucose, UA NEGATIVE NEGATIVE mg/dL   Hgb urine dipstick NEGATIVE NEGATIVE   Bilirubin Urine NEGATIVE NEGATIVE   Ketones, ur NEGATIVE NEGATIVE mg/dL   Protein, ur NEGATIVE NEGATIVE mg/dL   Urobilinogen, UA 0.2 0.0 - 1.0 mg/dL   Nitrite NEGATIVE NEGATIVE   Leukocytes, UA NEGATIVE NEGATIVE  Wet prep, genital     Status: Abnormal   Collection Time: 10/04/14 10:35 PM  Result Value Ref Range   Yeast Wet Prep HPF POC FEW (A) NONE SEEN   Trich, Wet Prep NONE SEEN NONE SEEN   Clue Cells Wet Prep HPF POC NONE SEEN NONE SEEN   WBC, Wet Prep HPF POC FEW (A) NONE SEEN    MAU Course  Procedures  MDM Patient is not having contractions on the monitor FHR tracing is reactive, one variable when laying on back. Resolved with position change, and >30 mins with reactive tracing Likely GI related pain 2/2 diarrhea  Assessment and Plan   1. Viral gastroenteritis    DC home Comfort measures reviewed  BRAT diet  3rd Trimester precautions  PTL precautions  Fetal kick counts RX: none  Return to MAU as needed FU with OB as planned  Follow-up Information    Follow up with Frederico Hamman, MD.   Specialty:  Obstetrics and Gynecology   Why:  As scheduled   Contact information:   [address] [place] [address] [phone]         [name] 10/04/2014, 10:05 PM

## 2014-10-05 LAB — GC/CHLAMYDIA PROBE AMP (~~LOC~~) NOT AT ARMC
CHLAMYDIA, DNA PROBE: NEGATIVE
NEISSERIA GONORRHEA: NEGATIVE

## 2014-11-15 ENCOUNTER — Other Ambulatory Visit: Payer: Self-pay | Admitting: Obstetrics

## 2014-11-15 DIAGNOSIS — O34219 Maternal care for unspecified type scar from previous cesarean delivery: Secondary | ICD-10-CM

## 2014-11-28 ENCOUNTER — Inpatient Hospital Stay (HOSPITAL_COMMUNITY)
Admission: AD | Admit: 2014-11-28 | Discharge: 2014-11-29 | Disposition: A | Discharge status: Home / Self Care | Payer: Medicaid Other | Source: Ambulatory Visit | Attending: Obstetrics | Admitting: Obstetrics

## 2014-11-28 ENCOUNTER — Encounter (HOSPITAL_COMMUNITY): Payer: Self-pay | Admitting: *Deleted

## 2014-11-28 DIAGNOSIS — Z3493 Encounter for supervision of normal pregnancy, unspecified, third trimester: Secondary | ICD-10-CM | POA: Insufficient documentation

## 2014-11-28 NOTE — MAU Note (Signed)
Pt c/o abdominal pain that started at 830pm-feels like contractions. Feeling every 5 mins. Having some pressure. Denies LOF or vag bleeding. +FM

## 2014-11-29 DIAGNOSIS — Z3493 Encounter for supervision of normal pregnancy, unspecified, third trimester: Secondary | ICD-10-CM | POA: Diagnosis not present

## 2014-11-29 NOTE — Discharge Instructions (Signed)
Third Trimester of Pregnancy °The third trimester is from week 29 through week 42, months 7 through 9. The third trimester is a time when the fetus is growing rapidly. At the end of the ninth month, the fetus is about 20 inches in length and weighs 6-10 pounds.  °BODY CHANGES °Your body goes through many changes during pregnancy. The changes vary from woman to woman.  °· Your weight will continue to increase. You can expect to gain 25-35 pounds (11-16 kg) by the end of the pregnancy. °· You may begin to get stretch marks on your hips, abdomen, and breasts. °· You may urinate more often because the fetus is moving lower into your pelvis and pressing on your bladder. °· You may develop or continue to have heartburn as a result of your pregnancy. °· You may develop constipation because certain hormones are causing the muscles that push waste through your intestines to slow down. °· You may develop hemorrhoids or swollen, bulging veins (varicose veins). °· You may have pelvic pain because of the weight gain and pregnancy hormones relaxing your joints between the bones in your pelvis. Backaches may result from overexertion of the muscles supporting your posture. °· You may have changes in your hair. These can include thickening of your hair, rapid growth, and changes in texture. Some women also have hair loss during or after pregnancy, or hair that feels dry or thin. Your hair will most likely return to normal after your baby is born. °· Your breasts will continue to grow and be tender. A yellow discharge may leak from your breasts called colostrum. °· Your belly button may stick out. °· You may feel short of breath because of your expanding uterus. °· You may notice the fetus "dropping," or moving lower in your abdomen. °· You may have a bloody mucus discharge. This usually occurs a few days to a week before labor begins. °· Your cervix becomes thin and soft (effaced) near your due date. °WHAT TO EXPECT AT YOUR PRENATAL  EXAMS  °You will have prenatal exams every 2 weeks until week 36. Then, you will have weekly prenatal exams. During a routine prenatal visit: °· You will be weighed to make sure you and the fetus are growing normally. °· Your blood pressure is taken. °· Your abdomen will be measured to track your baby's growth. °· The fetal heartbeat will be listened to. °· Any test results from the previous visit will be discussed. °· You may have a cervical check near your due date to see if you have effaced. °At around 36 weeks, your caregiver will check your cervix. At the same time, your caregiver will also perform a test on the secretions of the vaginal tissue. This test is to determine if a type of bacteria, Group B streptococcus, is present. Your caregiver will explain this further. °Your caregiver may ask you: °· What your birth plan is. °· How you are feeling. °· If you are feeling the baby move. °· If you have had any abnormal symptoms, such as leaking fluid, bleeding, severe headaches, or abdominal cramping. °· If you are using any tobacco products, including cigarettes, chewing tobacco, and electronic cigarettes. °· If you have any questions. °Other tests or screenings that may be performed during your third trimester include: °· Blood tests that check for low iron levels (anemia). °· Fetal testing to check the health, activity level, and growth of the fetus. Testing is done if you have certain medical conditions or if   there are problems during the pregnancy. °· HIV (human immunodeficiency virus) testing. If you are at high risk, you may be screened for HIV during your third trimester of pregnancy. °FALSE LABOR °You may feel small, irregular contractions that eventually go away. These are called Braxton Hicks contractions, or false labor. Contractions may last for hours, days, or even weeks before true labor sets in. If contractions come at regular intervals, intensify, or become painful, it is best to be seen by your  caregiver.  °SIGNS OF LABOR  °· Menstrual-like cramps. °· Contractions that are 5 minutes apart or less. °· Contractions that start on the top of the uterus and spread down to the lower abdomen and back. °· A sense of increased pelvic pressure or back pain. °· A watery or bloody mucus discharge that comes from the vagina. °If you have any of these signs before the 37th week of pregnancy, call your caregiver right away. You need to go to the hospital to get checked immediately. °HOME CARE INSTRUCTIONS  °· Avoid all smoking, herbs, alcohol, and unprescribed drugs. These chemicals affect the formation and growth of the baby. °· Do not use any tobacco products, including cigarettes, chewing tobacco, and electronic cigarettes. If you need help quitting, ask your health care provider. You may receive counseling support and other resources to help you quit. °· Follow your caregiver's instructions regarding medicine use. There are medicines that are either safe or unsafe to take during pregnancy. °· Exercise only as directed by your caregiver. Experiencing uterine cramps is a good sign to stop exercising. °· Continue to eat regular, healthy meals. °· Wear a good support bra for breast tenderness. °· Do not use hot tubs, steam rooms, or saunas. °· Wear your seat belt at all times when driving. °· Avoid raw meat, uncooked cheese, cat litter boxes, and soil used by cats. These carry germs that can cause birth defects in the baby. °· Take your prenatal vitamins. °· Take 1500-2000 mg of calcium daily starting at the 20th week of pregnancy until you deliver your baby. °· Try taking a stool softener (if your caregiver approves) if you develop constipation. Eat more high-fiber foods, such as fresh vegetables or fruit and whole grains. Drink plenty of fluids to keep your urine clear or pale yellow. °· Take warm sitz baths to soothe any pain or discomfort caused by hemorrhoids. Use hemorrhoid cream if your caregiver approves. °· If  you develop varicose veins, wear support hose. Elevate your feet for 15 minutes, 3-4 times a day. Limit salt in your diet. °· Avoid heavy lifting, wear low heal shoes, and practice good posture. °· Rest a lot with your legs elevated if you have leg cramps or low back pain. °· Visit your dentist if you have not gone during your pregnancy. Use a soft toothbrush to brush your teeth and be gentle when you floss. °· A sexual relationship may be continued unless your caregiver directs you otherwise. °· Do not travel far distances unless it is absolutely necessary and only with the approval of your caregiver. °· Take prenatal classes to understand, practice, and ask questions about the labor and delivery. °· Make a trial run to the hospital. °· Pack your hospital bag. °· Prepare the baby's nursery. °· Continue to go to all your prenatal visits as directed by your caregiver. °SEEK MEDICAL CARE IF: °· You are unsure if you are in labor or if your water has broken. °· You have dizziness. °· You have   mild pelvic cramps, pelvic pressure, or nagging pain in your abdominal area.  You have persistent nausea, vomiting, or diarrhea.  You have a bad smelling vaginal discharge.  You have pain with urination. SEEK IMMEDIATE MEDICAL CARE IF:   You have a fever.  You are leaking fluid from your vagina.  You have spotting or bleeding from your vagina.  You have severe abdominal cramping or pain.  You have rapid weight loss or gain.  You have shortness of breath with chest pain.  You notice sudden or extreme swelling of your face, hands, ankles, feet, or legs.  You have not felt your baby move in over an hour.  You have severe headaches that do not go away with medicine.  You have vision changes.   This information is not intended to replace advice given to you by your health care provider. Make sure you discuss any questions you have with your health care provider.   Document Released: 01/13/2001 Document  Revised: 02/09/2014 Document Reviewed: 03/22/2012 Elsevier Interactive Patient Education 2016 [place]. Fetal Movement Counts Patient Name: __________________________________________________ Patient Due Date: ____________________ Performing a fetal movement count is highly recommended in high-risk pregnancies, but it is good for every pregnant woman to do. Your health care provider may ask you to start counting fetal movements at 28 weeks of the pregnancy. Fetal movements often increase:  After eating a full meal.  After physical activity.  After eating or drinking something sweet or cold.  At rest. Pay attention to when you feel the baby is most active. This will help you notice a pattern of your baby's sleep and wake cycles and what factors contribute to an increase in fetal movement. It is important to perform a fetal movement count at the same time each day when your baby is normally most active.  HOW TO COUNT FETAL MOVEMENTS  Find a quiet and comfortable area to sit or lie down on your left side. Lying on your left side provides the best blood and oxygen circulation to your baby.  Write down the day and time on a sheet of paper or in a journal.  Start counting kicks, flutters, swishes, rolls, or jabs in a 2-hour period. You should feel at least 10 movements within 2 hours.  If you do not feel 10 movements in 2 hours, wait 2-3 hours and count again. Look for a change in the pattern or not enough counts in 2 hours. SEEK MEDICAL CARE IF:  You feel less than 10 counts in 2 hours, tried twice.  There is no movement in over an hour.  The pattern is changing or taking longer each day to reach 10 counts in 2 hours.  You feel the baby is not moving as he or she usually does. Date: ____________ Movements: ____________ Start time: ____________ Elizebeth Koller time: ____________  Date: ____________ Movements: ____________ Start time: ____________ Elizebeth Koller time: ____________ Date: ____________  Movements: ____________ Start time: ____________ Elizebeth Koller time: ____________ Date: ____________ Movements: ____________ Start time: ____________ Elizebeth Koller time: ____________ Date: ____________ Movements: ____________ Start time: ____________ Elizebeth Koller time: ____________ Date: ____________ Movements: ____________ Start time: ____________ Elizebeth Koller time: ____________ Date: ____________ Movements: ____________ Start time: ____________ Elizebeth Koller time: ____________ Date: ____________ Movements: ____________ Start time: ____________ Elizebeth Koller time: ____________  Date: ____________ Movements: ____________ Start time: ____________ Elizebeth Koller time: ____________ Date: ____________ Movements: ____________ Start time: ____________ Elizebeth Koller time: ____________ Date: ____________ Movements: ____________ Start time: ____________ Elizebeth Koller time: ____________ Date: ____________ Movements: ____________ Start time: ____________ Elizebeth Koller time: ____________ Date:  ____________ Movements: ____________ Start time: ____________ Elizebeth Koller time: ____________ Date: ____________ Movements: ____________ Start time: ____________ Elizebeth Koller time: ____________ Date: ____________ Movements: ____________ Start time: ____________ Elizebeth Koller time: ____________  Date: ____________ Movements: ____________ Start time: ____________ Elizebeth Koller time: ____________ Date: ____________ Movements: ____________ Start time: ____________ Elizebeth Koller time: ____________ Date: ____________ Movements: ____________ Start time: ____________ Elizebeth Koller time: ____________ Date: ____________ Movements: ____________ Start time: ____________ Elizebeth Koller time: ____________ Date: ____________ Movements: ____________ Start time: ____________ Elizebeth Koller time: ____________ Date: ____________ Movements: ____________ Start time: ____________ Elizebeth Koller time: ____________ Date: ____________ Movements: ____________ Start time: ____________ Elizebeth Koller time: ____________  Date: ____________ Movements: ____________ Start time:  ____________ Elizebeth Koller time: ____________ Date: ____________ Movements: ____________ Start time: ____________ Elizebeth Koller time: ____________ Date: ____________ Movements: ____________ Start time: ____________ Elizebeth Koller time: ____________ Date: ____________ Movements: ____________ Start time: ____________ Elizebeth Koller time: ____________ Date: ____________ Movements: ____________ Start time: ____________ Elizebeth Koller time: ____________ Date: ____________ Movements: ____________ Start time: ____________ Elizebeth Koller time: ____________ Date: ____________ Movements: ____________ Start time: ____________ Elizebeth Koller time: ____________  Date: ____________ Movements: ____________ Start time: ____________ Elizebeth Koller time: ____________ Date: ____________ Movements: ____________ Start time: ____________ Elizebeth Koller time: ____________ Date: ____________ Movements: ____________ Start time: ____________ Elizebeth Koller time: ____________ Date: ____________ Movements: ____________ Start time: ____________ Elizebeth Koller time: ____________ Date: ____________ Movements: ____________ Start time: ____________ Elizebeth Koller time: ____________ Date: ____________ Movements: ____________ Start time: ____________ Elizebeth Koller time: ____________ Date: ____________ Movements: ____________ Start time: ____________ Elizebeth Koller time: ____________  Date: ____________ Movements: ____________ Start time: ____________ Elizebeth Koller time: ____________ Date: ____________ Movements: ____________ Start time: ____________ Elizebeth Koller time: ____________ Date: ____________ Movements: ____________ Start time: ____________ Elizebeth Koller time: ____________ Date: ____________ Movements: ____________ Start time: ____________ Elizebeth Koller time: ____________ Date: ____________ Movements: ____________ Start time: ____________ Elizebeth Koller time: ____________ Date: ____________ Movements: ____________ Start time: ____________ Elizebeth Koller time: ____________ Date: ____________ Movements: ____________ Start time: ____________ Elizebeth Koller time: ____________  Date:  ____________ Movements: ____________ Start time: ____________ Elizebeth Koller time: ____________ Date: ____________ Movements: ____________ Start time: ____________ Elizebeth Koller time: ____________ Date: ____________ Movements: ____________ Start time: ____________ Elizebeth Koller time: ____________ Date: ____________ Movements: ____________ Start time: ____________ Elizebeth Koller time: ____________ Date: ____________ Movements: ____________ Start time: ____________ Elizebeth Koller time: ____________ Date: ____________ Movements: ____________ Start time: ____________ Elizebeth Koller time: ____________ Date: ____________ Movements: ____________ Start time: ____________ Elizebeth Koller time: ____________  Date: ____________ Movements: ____________ Start time: ____________ Elizebeth Koller time: ____________ Date: ____________ Movements: ____________ Start time: ____________ Elizebeth Koller time: ____________ Date: ____________ Movements: ____________ Start time: ____________ Elizebeth Koller time: ____________ Date: ____________ Movements: ____________ Start time: ____________ Elizebeth Koller time: ____________ Date: ____________ Movements: ____________ Start time: ____________ Elizebeth Koller time: ____________ Date: ____________ Movements: ____________ Start time: ____________ Elizebeth Koller time: ____________   This information is not intended to replace advice given to you by your health care provider. Make sure you discuss any questions you have with your health care provider.   Document Released: 02/18/2006 Document Revised: 02/09/2014 Document Reviewed: 11/16/2011 Elsevier Interactive Patient Education 2016 [employer] of the uterus can occur throughout pregnancy. Contractions are not always a sign that you are in labor.  WHAT ARE BRAXTON HICKS CONTRACTIONS?  Contractions that occur before labor are called Braxton Hicks contractions, or false labor. Toward the end of pregnancy (32-34 weeks), these contractions can develop more often and may become more  forceful. This is not true labor because these contractions do not result in opening (dilatation) and thinning of the cervix. They are sometimes difficult to tell apart from true labor because these contractions can be forceful and people have different pain tolerances. You should  not feel embarrassed if you go to the hospital with false labor. Sometimes, the only way to tell if you are in true labor is for your health care provider to look for changes in the cervix. If there are no prenatal problems or other health problems associated with the pregnancy, it is completely safe to be sent home with false labor and await the onset of true labor. HOW CAN YOU TELL THE DIFFERENCE BETWEEN TRUE AND FALSE LABOR? False Labor  The contractions of false labor are usually shorter and not as hard as those of true labor.   The contractions are usually irregular.   The contractions are often felt in the front of the lower abdomen and in the groin.   The contractions may go away when you walk around or change positions while lying down.   The contractions get weaker and are shorter lasting as time goes on.   The contractions do not usually become progressively stronger, regular, and closer together as with true labor.  True Labor  Contractions in true labor last 30-70 seconds, become very regular, usually become more intense, and increase in frequency.   The contractions do not go away with walking.   The discomfort is usually felt in the top of the uterus and spreads to the lower abdomen and low back.   True labor can be determined by your health care provider with an exam. This will show that the cervix is dilating and getting thinner.  WHAT TO REMEMBER  Keep up with your usual exercises and follow other instructions given by your health care provider.   Take medicines as directed by your health care provider.   Keep your regular prenatal appointments.   Eat and drink lightly if you  think you are going into labor.   If Braxton Hicks contractions are making you uncomfortable:   Change your position from lying down or resting to walking, or from walking to resting.   Sit and rest in a tub of warm water.   Drink 2-3 glasses of water. Dehydration may cause these contractions.   Do slow and deep breathing several times an hour.  WHEN SHOULD I SEEK IMMEDIATE MEDICAL CARE? Seek immediate medical care if:  Your contractions become stronger, more regular, and closer together.   You have fluid leaking or gushing from your vagina.   You have a fever.   You pass blood-tinged mucus.   You have vaginal bleeding.   You have continuous abdominal pain.   You have low back pain that you never had before.   You feel your baby's head pushing down and causing pelvic pressure.   Your baby is not moving as much as it used to.    This information is not intended to replace advice given to you by your health care provider. Make sure you discuss any questions you have with your health care provider.   Document Released: 01/19/2005 Document Revised: 01/24/2013 Document Reviewed: 10/31/2012 Elsevier Interactive Patient Education [employer].

## 2014-12-13 NOTE — H&P (Signed)
NAMESABRINIA, RUMMEL NO.:  [id number]  MEDICAL RECORD NO.:  [id number]  LOCATION:  [id number]                          FACILITY:  [facility]  PHYSICIAN:  Frederico Hamman, M.D.DATE OF BIRTH:  [birth year]  DATE OF ADMISSION:  11/28/2014 DATE OF DISCHARGE:  11/29/2014                             HISTORY & PHYSICAL   DATE OF OPERATION:  December 18, 2014.  HISTORY OF PRESENT ILLNESS:  The patient is a 24 year old, gravida 4, para 2-0-1-2, who has had two previous C-sections.  She is at term on December 22, 2014, and she is for repeat low-transverse cesarean section.  She has a positive GBS and she has had no problems with this pregnancy.  PAST MEDICAL HISTORY:  She has a history of asthma and she is on Dulera two puffs b.i.d.  PAST SURGICAL HISTORY:  C-section x2.  SOCIAL HISTORY:  Denies smoking, drinking, or any drug use.  FAMILY HISTORY:  Negative.  SYSTEM REVIEW:  Negative.  PHYSICAL EXAMINATION:  GENERAL:  Well-developed female in no distress. HEENT:  Negative. LUNGS:  Clear to P and A. HEART:  Regular rhythm.  No murmurs, no gallops. BREASTS:  Negative. ABDOMEN:  Term. PELVIC:  Deferred. EXTREMITIES:  Negative.          ______________________________ Frederico Hamman, M.D.     BAM/MEDQ  D:  12/13/2014  T:  12/13/2014  Job:  [id number]

## 2014-12-14 NOTE — Patient Instructions (Signed)
Your procedure is scheduled on:  Tuesday, Nov. 15, 2016  Enter through the [employer] of [facility] at:  7:45 AM  Pick up the phone at the desk and dial [phone].  Call this number if you have problems the morning of surgery: [phone].  Remember: Do NOT eat food or drink after:  MIDNIGHT MONDAY Take these medicines the morning of surgery with a SIP OF WATER:  XANAX IF NEEDED,  *BRING ASTHMA INHALER DAY OF SURGERY  Do NOT wear jewelry (body piercing), metal hair clips/bobby pins, or nail polish. Do NOT wear lotions, powders, or perfumes.  You may wear deoderant. Do NOT shave for 48 hours prior to surgery. Do NOT bring valuables to the hospital. Leave suitcase in car.  After surgery it may be brought to your room.  For patients admitted to the hospital, checkout time is 11:00 AM the day of discharge.

## 2014-12-17 ENCOUNTER — Encounter (HOSPITAL_COMMUNITY): Payer: Self-pay

## 2014-12-17 ENCOUNTER — Encounter (HOSPITAL_COMMUNITY)
Admission: RE | Admit: 2014-12-17 | Discharge: 2014-12-17 | Disposition: A | Discharge status: Home / Self Care | Payer: Medicaid Other | Source: Ambulatory Visit | Attending: Obstetrics | Admitting: Obstetrics

## 2014-12-17 HISTORY — DX: Anxiety disorder, unspecified: F41.9

## 2014-12-17 LAB — TYPE AND SCREEN
ABO/RH(D): B POS
ANTIBODY SCREEN: NEGATIVE

## 2014-12-17 LAB — CBC
HCT: 32.6 % — ABNORMAL LOW (ref 36.0–46.0)
HEMOGLOBIN: 10.3 g/dL — AB (ref 12.0–15.0)
MCH: 24 pg — ABNORMAL LOW (ref 26.0–34.0)
MCHC: 31.6 g/dL (ref 30.0–36.0)
MCV: 76 fL — ABNORMAL LOW (ref 78.0–100.0)
Platelets: 269 10*3/uL (ref 150–400)
RBC: 4.29 MIL/uL (ref 3.87–5.11)
RDW: 17.5 % — ABNORMAL HIGH (ref 11.5–15.5)
WBC: 6.2 10*3/uL (ref 4.0–10.5)

## 2014-12-17 NOTE — Patient Instructions (Signed)
Your procedure is scheduled on:  Tuesday, Nov. 15, 2016  Enter through the [employer] [facility] at:  7:45 a.m.  Pick up the phone at the desk and dial [phone].  Call this number if you have problems the morning of surgery: [phone].  Remember: Do NOT eat food or drink after:  Midnight tonight Take these medicines the morning of surgery with a SIP OF WATER: Xanax if needed  *Bring asthma inhalers day of surgery  Do NOT wear jewelry (body piercing), metal hair clips/bobby pins, or nail polish. Do NOT wear lotions, powders, or perfumes.  You may wear deoderant. Do NOT shave for 48 hours prior to surgery. Do NOT bring valuables to the hospital. Leave suitcase in car.  After surgery it may be brought to your room.  For patients admitted to the hospital, checkout time is 11:00 AM the day of discharge.

## 2014-12-18 ENCOUNTER — Encounter (HOSPITAL_COMMUNITY): Payer: Self-pay | Admitting: Anesthesiology

## 2014-12-18 ENCOUNTER — Inpatient Hospital Stay (HOSPITAL_COMMUNITY): Payer: Medicaid Other | Admitting: Anesthesiology

## 2014-12-18 ENCOUNTER — Encounter (HOSPITAL_COMMUNITY)
Admission: AD | Disposition: A | Discharge status: Home / Self Care | Payer: Self-pay | Source: Ambulatory Visit | Attending: Obstetrics

## 2014-12-18 ENCOUNTER — Inpatient Hospital Stay (HOSPITAL_COMMUNITY)
Admission: AD | Admit: 2014-12-18 | Discharge: 2014-12-21 | DRG: 766 | Disposition: A | Discharge status: Home / Self Care | Payer: Medicaid Other | Source: Ambulatory Visit | Attending: Obstetrics | Admitting: Obstetrics

## 2014-12-18 DIAGNOSIS — O99824 Streptococcus B carrier state complicating childbirth: Secondary | ICD-10-CM | POA: Diagnosis present

## 2014-12-18 DIAGNOSIS — K219 Gastro-esophageal reflux disease without esophagitis: Secondary | ICD-10-CM | POA: Diagnosis present

## 2014-12-18 DIAGNOSIS — O99344 Other mental disorders complicating childbirth: Secondary | ICD-10-CM | POA: Diagnosis present

## 2014-12-18 DIAGNOSIS — Z98891 History of uterine scar from previous surgery: Secondary | ICD-10-CM

## 2014-12-18 DIAGNOSIS — F419 Anxiety disorder, unspecified: Secondary | ICD-10-CM | POA: Diagnosis present

## 2014-12-18 DIAGNOSIS — Z3A40 40 weeks gestation of pregnancy: Secondary | ICD-10-CM | POA: Diagnosis not present

## 2014-12-18 DIAGNOSIS — O34211 Maternal care for low transverse scar from previous cesarean delivery: Secondary | ICD-10-CM | POA: Diagnosis present

## 2014-12-18 DIAGNOSIS — Z87891 Personal history of nicotine dependence: Secondary | ICD-10-CM

## 2014-12-18 DIAGNOSIS — O9962 Diseases of the digestive system complicating childbirth: Secondary | ICD-10-CM | POA: Diagnosis present

## 2014-12-18 LAB — RPR: RPR: NONREACTIVE

## 2014-12-18 SURGERY — Surgical Case
Anesthesia: Spinal

## 2014-12-18 MED ORDER — DIPHENHYDRAMINE HCL 25 MG PO CAPS
25.0000 mg | ORAL_CAPSULE | Freq: Four times a day (QID) | ORAL | Status: DC | PRN
  Administered 2014-12-19: 25 mg via ORAL

## 2014-12-18 MED ORDER — BUPIVACAINE IN DEXTROSE 0.75-8.25 % IT SOLN
INTRATHECAL | Status: DC | PRN
  Administered 2014-12-18: 1.6 mg via INTRATHECAL

## 2014-12-18 MED ORDER — SCOPOLAMINE 1 MG/3DAYS TD PT72
1.0000 | MEDICATED_PATCH | Freq: Once | TRANSDERMAL | Status: DC
  Administered 2014-12-18: 1.5 mg via TRANSDERMAL

## 2014-12-18 MED ORDER — OXYTOCIN 10 UNIT/ML IJ SOLN
INTRAMUSCULAR | Status: AC
  Filled 2014-12-18: qty 4

## 2014-12-18 MED ORDER — DIBUCAINE 1 % RE OINT: 1.0000 "application " | TOPICAL_OINTMENT | RECTAL | Status: DC | PRN

## 2014-12-18 MED ORDER — KETOROLAC TROMETHAMINE 30 MG/ML IJ SOLN
INTRAMUSCULAR | Status: AC
  Filled 2014-12-18: qty 1

## 2014-12-18 MED ORDER — DIPHENHYDRAMINE HCL 50 MG/ML IJ SOLN: 12.5000 mg | INTRAMUSCULAR | Status: DC | PRN

## 2014-12-18 MED ORDER — SIMETHICONE 80 MG PO CHEW
80.0000 mg | CHEWABLE_TABLET | ORAL | Status: DC
  Administered 2014-12-19 – 2014-12-20 (×3): 80 mg via ORAL
  Filled 2014-12-18 (×3): qty 1

## 2014-12-18 MED ORDER — KETOROLAC TROMETHAMINE 30 MG/ML IJ SOLN: 30.0000 mg | Freq: Four times a day (QID) | INTRAMUSCULAR | Status: AC | PRN

## 2014-12-18 MED ORDER — LACTATED RINGERS IV SOLN
INTRAVENOUS | Status: DC
Start: 2014-12-18 — End: 2014-12-21
  Administered 2014-12-18: 14:00:00 via INTRAVENOUS

## 2014-12-18 MED ORDER — FENTANYL CITRATE (PF) 100 MCG/2ML IJ SOLN
INTRAMUSCULAR | Status: AC
  Filled 2014-12-18: qty 4

## 2014-12-18 MED ORDER — OXYCODONE-ACETAMINOPHEN 5-325 MG PO TABS
1.0000 | ORAL_TABLET | ORAL | Status: DC | PRN
  Administered 2014-12-18 – 2014-12-20 (×4): 1 via ORAL
  Filled 2014-12-18 (×4): qty 1

## 2014-12-18 MED ORDER — OXYCODONE-ACETAMINOPHEN 5-325 MG PO TABS
2.0000 | ORAL_TABLET | ORAL | Status: DC | PRN
  Administered 2014-12-19 – 2014-12-21 (×9): 2 via ORAL
  Filled 2014-12-18 (×9): qty 2

## 2014-12-18 MED ORDER — SCOPOLAMINE 1 MG/3DAYS TD PT72
1.0000 | MEDICATED_PATCH | Freq: Once | TRANSDERMAL | Status: DC
  Filled 2014-12-18: qty 1

## 2014-12-18 MED ORDER — ACETAMINOPHEN 500 MG PO TABS
1000.0000 mg | ORAL_TABLET | Freq: Four times a day (QID) | ORAL | Status: AC
  Administered 2014-12-18: 1000 mg via ORAL
  Filled 2014-12-18: qty 2

## 2014-12-18 MED ORDER — PHENYLEPHRINE 8 MG IN D5W 100 ML (0.08MG/ML) PREMIX OPTIME
INJECTION | INTRAVENOUS | Status: AC
  Filled 2014-12-18: qty 100

## 2014-12-18 MED ORDER — MORPHINE SULFATE (PF) 0.5 MG/ML IJ SOLN
INTRAMUSCULAR | Status: DC | PRN
  Administered 2014-12-18: .2 ug via EPIDURAL

## 2014-12-18 MED ORDER — SODIUM CHLORIDE 0.9 % IJ SOLN: 3.0000 mL | INTRAMUSCULAR | Status: DC | PRN

## 2014-12-18 MED ORDER — NALOXONE HCL 0.4 MG/ML IJ SOLN: 0.4000 mg | INTRAMUSCULAR | Status: DC | PRN

## 2014-12-18 MED ORDER — TETANUS-DIPHTH-ACELL PERTUSSIS 5-2.5-18.5 LF-MCG/0.5 IM SUSP: 0.5000 mL | Freq: Once | INTRAMUSCULAR | Status: DC

## 2014-12-18 MED ORDER — ACETAMINOPHEN 325 MG PO TABS: 650.0000 mg | ORAL_TABLET | ORAL | Status: DC | PRN

## 2014-12-18 MED ORDER — MIDAZOLAM HCL 2 MG/2ML IJ SOLN
INTRAMUSCULAR | Status: AC
  Filled 2014-12-18: qty 4

## 2014-12-18 MED ORDER — ZOLPIDEM TARTRATE 5 MG PO TABS: 5.0000 mg | ORAL_TABLET | Freq: Every evening | ORAL | Status: DC | PRN

## 2014-12-18 MED ORDER — PHENYLEPHRINE HCL 10 MG/ML IJ SOLN
10.0000 mg | INTRAVENOUS | Status: DC | PRN
  Administered 2014-12-18: 50 ug/min via INTRAVENOUS

## 2014-12-18 MED ORDER — ONDANSETRON HCL 4 MG/2ML IJ SOLN
INTRAMUSCULAR | Status: DC | PRN
Start: 2014-12-18 — End: 2014-12-18
  Administered 2014-12-18: 4 mg via INTRAVENOUS

## 2014-12-18 MED ORDER — FENTANYL CITRATE (PF) 100 MCG/2ML IJ SOLN
25.0000 ug | INTRAMUSCULAR | Status: DC | PRN
  Administered 2014-12-18: 50 ug via INTRAVENOUS
  Administered 2014-12-18: 25 ug via INTRAVENOUS

## 2014-12-18 MED ORDER — OXYTOCIN 40 UNITS IN LACTATED RINGERS INFUSION - SIMPLE MED: 62.5000 mL/h | INTRAVENOUS | Status: AC

## 2014-12-18 MED ORDER — KETOROLAC TROMETHAMINE 30 MG/ML IJ SOLN
30.0000 mg | Freq: Four times a day (QID) | INTRAMUSCULAR | Status: AC | PRN
  Administered 2014-12-18: 30 mg via INTRAMUSCULAR

## 2014-12-18 MED ORDER — MORPHINE SULFATE (PF) 0.5 MG/ML IJ SOLN
INTRAMUSCULAR | Status: AC
  Filled 2014-12-18: qty 100

## 2014-12-18 MED ORDER — DIPHENHYDRAMINE HCL 25 MG PO CAPS
25.0000 mg | ORAL_CAPSULE | ORAL | Status: DC | PRN
  Administered 2014-12-18 – 2014-12-19 (×2): 25 mg via ORAL
  Filled 2014-12-18 (×3): qty 1

## 2014-12-18 MED ORDER — FENTANYL CITRATE (PF) 100 MCG/2ML IJ SOLN
INTRAMUSCULAR | Status: AC
  Filled 2014-12-18: qty 2

## 2014-12-18 MED ORDER — MIDAZOLAM HCL 5 MG/ML IJ SOLN
INTRAMUSCULAR | Status: DC | PRN
Start: 2014-12-18 — End: 2014-12-18
  Administered 2014-12-18 (×2): 1 mg via INTRAVENOUS

## 2014-12-18 MED ORDER — IBUPROFEN 600 MG PO TABS
600.0000 mg | ORAL_TABLET | Freq: Four times a day (QID) | ORAL | Status: DC
Start: 2014-12-18 — End: 2014-12-21
  Administered 2014-12-18 – 2014-12-21 (×12): 600 mg via ORAL
  Filled 2014-12-18 (×12): qty 1

## 2014-12-18 MED ORDER — ONDANSETRON HCL 4 MG/2ML IJ SOLN
INTRAMUSCULAR | Status: AC
  Filled 2014-12-18: qty 2

## 2014-12-18 MED ORDER — NALBUPHINE HCL 10 MG/ML IJ SOLN: 5.0000 mg | INTRAMUSCULAR | Status: DC | PRN

## 2014-12-18 MED ORDER — PHENYLEPHRINE 40 MCG/ML (10ML) SYRINGE FOR IV PUSH (FOR BLOOD PRESSURE SUPPORT)
PREFILLED_SYRINGE | INTRAVENOUS | Status: AC
  Filled 2014-12-18: qty 10

## 2014-12-18 MED ORDER — NALOXONE HCL 2 MG/2ML IJ SOSY: 1.0000 ug/kg/h | PREFILLED_SYRINGE | INTRAVENOUS | Status: DC | PRN

## 2014-12-18 MED ORDER — MEPERIDINE HCL 25 MG/ML IJ SOLN: 6.2500 mg | INTRAMUSCULAR | Status: DC | PRN

## 2014-12-18 MED ORDER — OXYTOCIN 10 UNIT/ML IJ SOLN
40.0000 [IU] | INTRAVENOUS | Status: DC | PRN
  Administered 2014-12-18: 40 [IU] via INTRAVENOUS

## 2014-12-18 MED ORDER — NALBUPHINE HCL 10 MG/ML IJ SOLN
5.0000 mg | INTRAMUSCULAR | Status: DC | PRN
  Administered 2014-12-18 (×2): 5 mg via SUBCUTANEOUS
  Filled 2014-12-18 (×2): qty 1

## 2014-12-18 MED ORDER — MENTHOL 3 MG MT LOZG: 1.0000 | LOZENGE | OROMUCOSAL | Status: DC | PRN

## 2014-12-18 MED ORDER — SENNOSIDES-DOCUSATE SODIUM 8.6-50 MG PO TABS
2.0000 | ORAL_TABLET | ORAL | Status: DC
  Administered 2014-12-19 – 2014-12-20 (×3): 2 via ORAL
  Filled 2014-12-18 (×3): qty 2

## 2014-12-18 MED ORDER — NALBUPHINE HCL 10 MG/ML IJ SOLN
INTRAMUSCULAR | Status: AC
  Filled 2014-12-18: qty 1

## 2014-12-18 MED ORDER — ONDANSETRON HCL 4 MG/2ML IJ SOLN: 4.0000 mg | Freq: Three times a day (TID) | INTRAMUSCULAR | Status: DC | PRN

## 2014-12-18 MED ORDER — NALBUPHINE HCL 10 MG/ML IJ SOLN: 5.0000 mg | Freq: Once | INTRAMUSCULAR | Status: AC | PRN

## 2014-12-18 MED ORDER — SIMETHICONE 80 MG PO CHEW
80.0000 mg | CHEWABLE_TABLET | ORAL | Status: DC | PRN
Start: 2014-12-18 — End: 2014-12-21

## 2014-12-18 MED ORDER — LACTATED RINGERS IV SOLN
INTRAVENOUS | Status: DC
  Administered 2014-12-18 (×2): via INTRAVENOUS
  Administered 2014-12-18: 125 mL/h via INTRAVENOUS

## 2014-12-18 MED ORDER — CEFAZOLIN SODIUM-DEXTROSE 2-3 GM-% IV SOLR
INTRAVENOUS | Status: AC
  Filled 2014-12-18: qty 50

## 2014-12-18 MED ORDER — LANOLIN HYDROUS EX OINT: 1.0000 "application " | TOPICAL_OINTMENT | CUTANEOUS | Status: DC | PRN

## 2014-12-18 MED ORDER — SIMETHICONE 80 MG PO CHEW
80.0000 mg | CHEWABLE_TABLET | Freq: Three times a day (TID) | ORAL | Status: DC
  Administered 2014-12-18 – 2014-12-21 (×10): 80 mg via ORAL
  Filled 2014-12-18 (×10): qty 1

## 2014-12-18 MED ORDER — CEFAZOLIN SODIUM-DEXTROSE 2-3 GM-% IV SOLR
2.0000 g | INTRAVENOUS | Status: AC
  Administered 2014-12-18: 2 g via INTRAVENOUS

## 2014-12-18 MED ORDER — SCOPOLAMINE 1 MG/3DAYS TD PT72
MEDICATED_PATCH | TRANSDERMAL | Status: AC
  Administered 2014-12-18: 1.5 mg via TRANSDERMAL
  Filled 2014-12-18: qty 1

## 2014-12-18 MED ORDER — NALBUPHINE HCL 10 MG/ML IJ SOLN
5.0000 mg | Freq: Once | INTRAMUSCULAR | Status: AC | PRN
  Administered 2014-12-18: 5 mg via INTRAVENOUS

## 2014-12-18 MED ORDER — FENTANYL CITRATE (PF) 100 MCG/2ML IJ SOLN
INTRAMUSCULAR | Status: DC | PRN
  Administered 2014-12-18: 10 ug via INTRAVENOUS

## 2014-12-18 MED ORDER — WITCH HAZEL-GLYCERIN EX PADS: 1.0000 "application " | MEDICATED_PAD | CUTANEOUS | Status: DC | PRN

## 2014-12-18 MED ORDER — LACTATED RINGERS IV SOLN
Freq: Once | INTRAVENOUS | Status: AC
  Administered 2014-12-18: 08:00:00 via INTRAVENOUS

## 2014-12-18 MED ORDER — ONDANSETRON HCL 4 MG/2ML IJ SOLN: 4.0000 mg | Freq: Once | INTRAMUSCULAR | Status: DC | PRN

## 2014-12-18 MED ORDER — PRENATAL MULTIVITAMIN CH
1.0000 | ORAL_TABLET | Freq: Every day | ORAL | Status: DC
  Administered 2014-12-19 – 2014-12-21 (×3): 1 via ORAL
  Filled 2014-12-18 (×3): qty 1

## 2014-12-18 MED ORDER — PHENYLEPHRINE HCL 10 MG/ML IJ SOLN
INTRAMUSCULAR | Status: DC | PRN
  Administered 2014-12-18: 80 ug via INTRAVENOUS

## 2014-12-18 SURGICAL SUPPLY — 34 items
CLAMP CORD UMBIL (MISCELLANEOUS) IMPLANT
CLOTH BEACON ORANGE TIMEOUT ST (SAFETY) ×3 IMPLANT
DRAPE SHEET LG 3/4 BI-LAMINATE (DRAPES) IMPLANT
DRSG OPSITE POSTOP 4X10 (GAUZE/BANDAGES/DRESSINGS) ×3 IMPLANT
DURAPREP 26ML APPLICATOR (WOUND CARE) ×3 IMPLANT
ELECT REM PT RETURN 9FT ADLT (ELECTROSURGICAL) ×3
ELECTRODE REM PT RTRN 9FT ADLT (ELECTROSURGICAL) ×1 IMPLANT
EXTRACTOR VACUUM M CUP 4 TUBE (SUCTIONS) IMPLANT
EXTRACTOR VACUUM M CUP 4' TUBE (SUCTIONS)
GLOVE BIO SURGEON STRL SZ8.5 (GLOVE) ×3 IMPLANT
GLOVE BIOGEL PI IND STRL 7.0 (GLOVE) ×1 IMPLANT
GLOVE BIOGEL PI INDICATOR 7.0 (GLOVE) ×2
GOWN STRL REUS W/TWL 2XL LVL3 (GOWN DISPOSABLE) ×3 IMPLANT
GOWN STRL REUS W/TWL LRG LVL3 (GOWN DISPOSABLE) ×3 IMPLANT
KIT ABG SYR 3ML LUER SLIP (SYRINGE) IMPLANT
NEEDLE HYPO 25X5/8 SAFETYGLIDE (NEEDLE) IMPLANT
NS IRRIG 1000ML POUR BTL (IV SOLUTION) ×3 IMPLANT
PACK C SECTION WH (CUSTOM PROCEDURE TRAY) ×3 IMPLANT
PAD OB MATERNITY 4.3X12.25 (PERSONAL CARE ITEMS) ×3 IMPLANT
PENCIL SMOKE EVAC W/HOLSTER (ELECTROSURGICAL) ×3 IMPLANT
RTRCTR C-SECT PINK 25CM LRG (MISCELLANEOUS) ×3 IMPLANT
SUT CHROMIC 0 CT 802H (SUTURE) ×3 IMPLANT
SUT CHROMIC 0 MO4 CR (SUTURE) IMPLANT
SUT CHROMIC 1 CTX 36 (SUTURE) ×12 IMPLANT
SUT CHROMIC 2 0 SH (SUTURE) ×3 IMPLANT
SUT GUT PLAIN 0 CT-3 TAN 27 (SUTURE) IMPLANT
SUT MON AB 4-0 PS1 27 (SUTURE) ×3 IMPLANT
SUT PDS AB 0 CTX 36 PDP370T (SUTURE) IMPLANT
SUT VIC AB 0 CT1 18XCR BRD8 (SUTURE) IMPLANT
SUT VIC AB 0 CT1 8-18 (SUTURE)
SUT VIC AB 0 CTX 36 (SUTURE) ×4
SUT VIC AB 0 CTX36XBRD ANBCTRL (SUTURE) ×2 IMPLANT
TOWEL OR 17X24 6PK STRL BLUE (TOWEL DISPOSABLE) ×3 IMPLANT
TRAY FOLEY CATH SILVER 14FR (SET/KITS/TRAYS/PACK) ×3 IMPLANT

## 2014-12-18 NOTE — Brief Op Note (Signed)
12/18/2014  10:09 AM  PATIENT:  [name]  24 y.o. female  PRE-OPERATIVE DIAGNOSIS:  REPEAT C/S  POST-OPERATIVE DIAGNOSIS:  REPEAT C/S  PROCEDURE:  Procedure(s): REPEAT CESAREAN SECTION (N/A)  SURGEON:  Surgeon(s) and Role:    * Frederico Hamman, MD - Primary    * Shelly Bombard, MD  PHYSICIAN ASSISTANT:   ASSISTANTS: charles harper. md   ANESTHESIA:   spinal  EBL:  Total I/O In: 3000 [I.V.:3000] Out: 950 [Urine:150; Blood:800]  BLOOD ADMINISTERED:none  DRAINS: none   LOCAL MEDICATIONS USED:  NONE  SPECIMEN:  No Specimen  DISPOSITION OF SPECIMEN:  N/A  COUNTS:  YES  TOURNIQUET:  * No tourniquets in log *  DICTATION: .Dragon Dictation  PLAN OF CARE: Admit to inpatient   PATIENT DISPOSITION:  PACU - hemodynamically stable.   Delay start of Pharmacological VTE agent (>24hrs) due to surgical blood loss or risk of bleeding: no

## 2014-12-18 NOTE — Consult Note (Signed)
Neonatology Note:   Attendance at C-section:  I was asked by Dr. Laqueta Linden to attend this elective C/S at term. The mother is a 24 year old, gravida 4,  para 2-0-1-2, GBS positive with otherwise reassuring labs. Uncomplicated pregnancy. No labor. ROM at delivery, fluid clear. Infant vigorous with good spontaneous cry and tone. Needed only minimal bulb suctioning. Ap 8 and 9. Lungs clear to ausc in DR. To CN to care of Pediatrician.  Support lactation.  Jerlyn Ly, MD

## 2014-12-18 NOTE — Op Note (Signed)
Preop diagnosis previous cesarean section 2 at term Postop diagnosis repeat low transverse cesarean section Surgeon Dr. Gracy Racer First assistant Dr. Baltazar Najjar Anesthesia spinal Procedure patient placed on the operating table in the supine position after the spinal administered abdomen prepped and draped bladder emptied with a Foley catheter a transverse suprapubic incision made through the old scar carried   to the rectus fascia fascia cleaned and incised length of the incision recti muscles retracted laterally peritoneum incised longitudinally transverse incision made on the visceroperitoneum above the bladder and the bladder mobilized inferiorly transverse low  Uterine  incision made in the patient delivered from the  Op  position of a female Apgar 8 and 9 fluid was clear the placenta was posterior fundal removed manually uterine cavity clean with dry laps the uterine incision closed in 2 layers with continuous suture of #1 chromic hemostasis satisfactory lap and sponge counts correct abdomen closed in layers peritoneum continuous with of 0 chromic fascia continuous with Tennis Must xon  skin closes subcuticular stitch of 4-0 Monocryl blood loss 800 cc patient tolerated the procedure well
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
## 2014-12-18 NOTE — Addendum Note (Signed)
Addendum  created 12/18/14 1723 by Flossie Dibble, CRNA   Modules edited: Notes Section   Notes Section:  File: [id number]

## 2014-12-18 NOTE — Anesthesia Procedure Notes (Signed)
Spinal Patient location during procedure: OR Staffing Anesthesiologist: Tilley Faeth Performed by: anesthesiologist  Preanesthetic Checklist Completed: patient identified, site marked, surgical consent, pre-op evaluation, timeout performed, IV checked, risks and benefits discussed and monitors and equipment checked Spinal Block Patient position: sitting Prep: ChloraPrep Patient monitoring: continuous pulse ox, blood pressure and heart rate Approach: midline Location: L3-4 Injection technique: single-shot Needle Needle type: Sprotte  Needle gauge: 24 G Needle length: 9 cm Assessment Events: paresthesia and L sided paresthesia on first pass, withdrew needle and instantly went away, not present when injecting Additional Notes  Functioning IV was confirmed and monitors were applied. Sterile prep and drape, including hand hygiene, mask and sterile gloves were used. The patient was positioned and the spine was prepped. The skin was anesthetized with lidocaine.  Free flow of clear CSF was obtained prior to injecting local anesthetic into the CSF.  The spinal needle aspirated freely following injection.  The needle was carefully withdrawn.  The patient tolerated the procedure well. Consent was obtained prior to procedure with all questions answered and concerns addressed. Risks including but not limited to bleeding, infection, nerve damage, paralysis, failed block, inadequate analgesia, allergic reaction, high spinal, itching and headache were discussed and the patient wished to proceed.   Lauretta Grill, MD

## 2014-12-18 NOTE — Progress Notes (Signed)
UR chart review completed.  

## 2014-12-18 NOTE — Anesthesia Postprocedure Evaluation (Signed)
Anesthesia Post Note  Patient: [name]  Procedure(s) Performed: Procedure(s) (LRB): REPEAT CESAREAN SECTION (N/A)  Anesthesia type: Spinal  Patient location: Mother/Baby  Post pain: Pain level controlled  Post assessment: Post-op Vital signs reviewed  Last Vitals:  Filed Vitals:   12/18/14 1700  BP: 118/57  Pulse: 80  Temp: 36.9 C  Resp: 16    Post vital signs: Reviewed  Level of consciousness: awake  Complications: No apparent anesthesia complications

## 2014-12-18 NOTE — Transfer of Care (Signed)
Immediate Anesthesia Transfer of Care Note  Patient: [name]  Procedure(s) Performed: Procedure(s): REPEAT CESAREAN SECTION (N/A)  Patient Location: PACU  Anesthesia Type:Spinal  Level of Consciousness: awake, alert  and oriented  Airway & Oxygen Therapy: Patient Spontanous Breathing  Post-op Assessment: Report given to RN and Post -op Vital signs reviewed and stable  Post vital signs: Reviewed and stable  Last Vitals:  Filed Vitals:   12/18/14 0800  BP: 114/76  Pulse: 97  Temp: 36.8 C  Resp: 16    Complications: No apparent anesthesia complications

## 2014-12-18 NOTE — H&P (Signed)
  There has been no change in her history and physical since the original dictation 

## 2014-12-18 NOTE — Anesthesia Postprocedure Evaluation (Signed)
  Anesthesia Post-op Note  Patient: [name]  Procedure(s) Performed: Procedure(s) (LRB): REPEAT CESAREAN SECTION (N/A)  Patient Location: PACU  Anesthesia Type: Spinal  Level of Consciousness: awake and alert   Airway and Oxygen Therapy: Patient Spontanous Breathing  Post-op Pain: mild  Post-op Assessment: Post-op Vital signs reviewed, Patient's Cardiovascular Status Stable, Respiratory Function Stable, Patent Airway and No signs of Nausea or vomiting  Last Vitals:  Filed Vitals:   12/18/14 1157  BP: 119/62  Pulse: 73  Temp: 36.5 C  Resp: 16    Post-op Vital Signs: stable   Complications: No apparent anesthesia complications

## 2014-12-18 NOTE — Lactation Note (Signed)
This note was copied from the chart of Madison [employer]. Lactation Consultation Note; Initial visit with this experienced BF mom. She reports that baby has already had 3 good feedings. Now 5 hours old. Last feeding 1 1/2 hours ago for 25 min. Baby asleep on dad's chest at this time. Reviewed feeding cues and encouraged to feed whenever she sees them. BF brochure given with resources for support after DC. Asking about pumping to increase supply. Encouraged frequent nursing for now, No further questions at present. To call for assist prn  Patient Name: [name] [id number] Date: 12/18/2014 Reason for consult: Initial assessment   Maternal Data Formula Feeding for Exclusion: No Does the patient have breastfeeding experience prior to this delivery?: Yes  Feeding   LATCH Score/Interventions                      Lactation Tools Discussed/Used     Consult Status Consult Status: Follow-up Date: 12/19/14 Follow-up type: In-patient    Truddie Crumble 12/18/2014, 3:19 PM

## 2014-12-19 ENCOUNTER — Encounter (HOSPITAL_COMMUNITY): Payer: Self-pay | Admitting: Obstetrics

## 2014-12-19 LAB — BIRTH TISSUE RECOVERY COLLECTION (PLACENTA DONATION)

## 2014-12-19 LAB — CBC
HCT: 27.6 % — ABNORMAL LOW (ref 36.0–46.0)
Hemoglobin: 8.9 g/dL — ABNORMAL LOW (ref 12.0–15.0)
MCH: 24.4 pg — AB (ref 26.0–34.0)
MCHC: 32.2 g/dL (ref 30.0–36.0)
MCV: 75.6 fL — AB (ref 78.0–100.0)
PLATELETS: 238 10*3/uL (ref 150–400)
RBC: 3.65 MIL/uL — ABNORMAL LOW (ref 3.87–5.11)
RDW: 17.7 % — AB (ref 11.5–15.5)
WBC: 10.8 10*3/uL — AB (ref 4.0–10.5)

## 2014-12-19 MED ORDER — SERTRALINE HCL 50 MG PO TABS
50.0000 mg | ORAL_TABLET | Freq: Every day | ORAL | Status: DC
  Administered 2014-12-20 (×2): 50 mg via ORAL
  Filled 2014-12-19 (×4): qty 1

## 2014-12-19 NOTE — Progress Notes (Signed)
Called into room   States she may be having allergic to reaction to cantelope   B/p 136/82  Hr 88  o2sat 100  Gave benadryl   Looked like tongue might have been getting bigger   No hives noted any where    Felt better after 10 min
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
## 2014-12-19 NOTE — Progress Notes (Signed)
Patient ID: [name], female   DOB: 23-Apr-1990, 25 y.o.   MRN: [id number] Postop day 1 Blood pressure 110/69 respiration 21 pulse 97 Fundus firm Lochia moderate Doing well

## 2014-12-19 NOTE — Lactation Note (Signed)
This note was copied from the chart of Madison [employer]. Lactation Consultation Note  Provided information from "Medication & Mothers' Milk" by Levon Hedger Ph.D. 2014 regarding prescribed Zoloft and Xanax. Recommend Susie RN give patient medication information and suggest that patient discuss these medications with her Pediatrician. Zoloft L1 and Xanax L3.  Alternative for Xanax listed for breastfeeding mother's is Lorazepam.  Patient Name: [name] Today's Date: 12/19/2014     Maternal Data    Feeding Feeding Type: Breast Fed Length of feed: 60 min  LATCH Score/Interventions                      Lactation Tools Discussed/Used     Consult Status      [name] 12/19/2014, 10:37 PM

## 2014-12-19 NOTE — Lactation Note (Signed)
This note was copied from the chart of Madison [employer]. Lactation Consultation Note Called by pt. To come assist w/latch. I was in another room assisting another pt. RN assisted w/latch. Visited mom and discussed questions and concerns. Mom used DEBP and pump approx. 62ml colostrum. Set up by [facility] RN. Discussed obtaining deep latches, positioning, chin tugs, listening for swallows, cluster feeding, amount of I&O at age of baby. Reviewed baby and Me book for I&O. Just needed reassurance RN stated, RN stated had good latch.. Patient Name: [name] [id number] Date: 12/19/2014 Reason for consult: Follow-up assessment   Maternal Data    Feeding Feeding Type: Breast Fed  LATCH Score/Interventions Latch: Grasps breast easily, tongue down, lips flanged, rhythmical sucking.  Audible Swallowing: A few with stimulation  Type of Nipple: Everted at rest and after stimulation  Comfort (Breast/Nipple): Soft / non-tender     Hold (Positioning): Assistance needed to correctly position infant at breast and maintain latch.  LATCH Score: 8  Lactation Tools Discussed/Used Tools: Pump Breast pump type: Double-Electric Breast Pump Pump Review: Setup, frequency, and cleaning;Milk Storage Initiated by:: RN Date initiated:: 12/19/14   Consult Status Consult Status: Follow-up Date: 12/20/14 Follow-up type: In-patient    Theodoro Kalata 12/19/2014, 6:24 AM

## 2014-12-19 NOTE — Lactation Note (Signed)
This note was copied from the chart of Madison [employer]. Lactation Consultation Note: Experienced BF mom reports baby has just finished feeding for 10 min. Baby still rooting . Suggested latching to other breast. Took a few attempts then latched well, Mom reports breasts are feeling a little heavier this afternoon. Reports she has pumped 2 times today and obtained a few drops. No questions at present. To call prn  Patient Name: [name] [id number] Date: 12/19/2014 Reason for consult: Follow-up assessment   Maternal Data Formula Feeding for Exclusion: No Does the patient have breastfeeding experience prior to this delivery?: Yes  Feeding Feeding Type: Breast Fed Length of feed: 10 min  LATCH Score/Interventions Latch: Grasps breast easily, tongue down, lips flanged, rhythmical sucking.  Audible Swallowing: A few with stimulation  Type of Nipple: Everted at rest and after stimulation  Comfort (Breast/Nipple): Soft / non-tender     Hold (Positioning): Assistance needed to correctly position infant at breast and maintain latch.  LATCH Score: 8  Lactation Tools Discussed/Used     Consult Status Consult Status: Follow-up Date: 12/20/14 Follow-up type: In-patient    Truddie Crumble 12/19/2014, 3:34 PM

## 2014-12-20 NOTE — Progress Notes (Signed)
Patient ID: [name], female   DOB: 10/10/1990, 24 y.o.   MRN: [id number] Postop day 3 Blood pressure 130/66 respiration 18 pulse 83 Abdomen soft Dressing dry Legs negative home today

## 2014-12-20 NOTE — Discharge Summary (Signed)
Obstetric Discharge Summary Reason for Admission: cesarean section Prenatal Procedures: none Intrapartum Procedures: cesarean: low cervical, transverse Postpartum Procedures: none Complications-Operative and Postpartum: none HEMOGLOBIN  Date Value Ref Range Status  12/19/2014 8.9* 12.0 - 15.0 g/dL Final   HCT  Date Value Ref Range Status  12/19/2014 27.6* 36.0 - 46.0 % Final    Physical Exam:  General: alert Lochia: appropriate Uterine Fundus: firm Incision: healing well DVT Evaluation: No evidence of DVT seen on physical exam.  Discharge Diagnoses: Term Pregnancy-delivered  Discharge Information: Date: 12/20/2014 Activity: pelvic rest Diet: routine Medications: Percocet Condition: improved Instructions: refer to practice specific booklet Discharge to: home Follow-up Information    Follow up with Frederico Hamman, MD.   Specialty:  Obstetrics and Gynecology   Contact information:   [address] [place] [address] [phone]       Newborn Data: Live born female  Birth Weight: 6 lb 7.2 oz (2925 g) APGAR: 8, 9  Home with mother.  Keri Tavella A 12/20/2014, 6:24 AM

## 2014-12-20 NOTE — Lactation Note (Signed)
This note was copied from the chart of [name]. Lactation Consultation Note  Patient Name: Madison Daarina Thigpen [id number] Date: 12/20/2014 Reason for consult: Follow-up assessment;Infant < 6lbs Mom reports baby pulls on/off the breast with initial latch. Also reports nipple tenderness on left nipple. Mom has colostrum with hand expression. She is latching baby in football hold and appears to obtain good depth, baby will adjust herself take few suckles adjust again. Once she finds her rhythm she developed a good suckling pattern with noted swallows at this visit. Mom reports baby not latching as often to left breast. Some superficial cracking noted at base of nipple. Care for sore nipples reviewed, comfort gels given with instructions. Assisted Mom with obtaining depth with latch on left breast at this visit.  Baby is at 8.4% weight loss with 3.3% weight loss in 24 hours. Baby has voided 3 times in 24 hours/9 times in her life. Stools 2 times in 24 hours/3 in life. Baby appears to be wanting to cluster feed. Advised to continue to BF any time baby is hungry but at least 8-12 times in 24 hours. Keep her nursing for 15-20 minutes, both breasts when possible. Mom has DEBP set up, advised she can post pump and give baby back any amount of EBM she receives. Call for assist as needed.   Maternal Data    Feeding Feeding Type: Breast Fed Length of feed: 11 min  LATCH Score/Interventions Latch: Grasps breast easily, tongue down, lips flanged, rhythmical sucking. Intervention(s): Adjust position;Assist with latch;Breast massage;Breast compression  Audible Swallowing: Spontaneous and intermittent Intervention(s): Skin to skin;Hand expression  Type of Nipple: Everted at rest and after stimulation  Comfort (Breast/Nipple): Filling, red/small blisters or bruises, mild/mod discomfort  Problem noted: Mild/Moderate discomfort Interventions (Mild/moderate discomfort): Hand massage;Hand  expression;Comfort gels  Hold (Positioning): Assistance needed to correctly position infant at breast and maintain latch. Intervention(s): Breastfeeding basics reviewed;Support Pillows;Position options;Skin to skin  LATCH Score: 8  Lactation Tools Discussed/Used     Consult Status Consult Status: Follow-up Date: 12/21/14 Follow-up type: In-patient    Katrine Coho 12/20/2014, 4:10 PM

## 2014-12-21 NOTE — Lactation Note (Signed)
This note was copied from the chart of Madison [employer]. Lactation Consultation Note  Patient Name: [name] [id number] Date: 12/21/2014 Reason for consult: Follow-up assessment;Infant weight loss;Infant < 6lbs Mom call for latch check, Baby nursed for about 5 minutes then fell asleep. Mom gave baby 22 ml of EBM via bottle. Baby satiated and had 25 ml of EBM around 0730 this am.  [facility] referral faxed and [facility] office called. Mom has [facility] appointment for Monday, 12/24/14 at 0900. Mom to call [facility] for latch check next feeding and to supplement with 30 ml of EBM every 3 hours. Mom to post pump every 3 hours to have EBM to supplement and support milk production/milk supply.   Maternal Data    Feeding Feeding Type: Breast Milk Length of feed: 0 min  LATCH Score/Interventions Latch: Too sleepy or reluctant, no latch achieved, no sucking elicited.                    Lactation Tools Discussed/Used Tools: Pump Breast pump type: Double-Electric Breast Pump   Consult Status Consult Status: Follow-up Date: 12/21/14 Follow-up type: In-patient    Katrine Coho 12/21/2014, 9:23 AM

## 2014-12-21 NOTE — Lactation Note (Signed)
This note was copied from the chart of [name]. Lactation Consultation Note  Patient Name: Madison Dalasia Jarvi [id number] Date: 12/21/2014 Reason for consult: Follow-up assessment;Infant weight loss;Infant < 6lbs Baby nursed well on right breast with swallows noted. Mom having difficulty with latch on left breast. Baby pops on/off the breast, has difficulty obtaining good depth. Mom reports nipple tenderness with nursing on left breast. After few minutes Mom became frustrated and decided to supplement with EBM via bottle 30 ml. Mom applied comfort gel. LC advised Mom to call with next feeding for assist on left breast, pre--pump for about 5 minutes to get milk flow moving to see if this helps with latch. If not and baby continues to pop on/off the breast may try nipple shield to see if baby can sustain the latch better. Baby will also pop on/off the right breast but is able to sustain the latch after few minutes. Mom has [place] number to call.   Maternal Data    Feeding Feeding Type: Breast Fed Length of feed: 5 min  LATCH Score/Interventions Latch: Repeated attempts needed to sustain latch, nipple held in mouth throughout feeding, stimulation needed to elicit sucking reflex. Intervention(s): Adjust position;Assist with latch;Breast massage;Breast compression  Audible Swallowing: A few with stimulation  Type of Nipple: Everted at rest and after stimulation  Comfort (Breast/Nipple): Filling, red/small blisters or bruises, mild/mod discomfort  Problem noted: Mild/Moderate discomfort (left nipple) Interventions (Mild/moderate discomfort): Hand massage;Hand expression;Comfort gels  Hold (Positioning): Assistance needed to correctly position infant at breast and maintain latch. Intervention(s): Support Pillows;Breastfeeding basics reviewed  LATCH Score: 6  Lactation Tools Discussed/Used Tools: Pump Breast pump type: Double-Electric Breast Pump   Consult Status Consult Status:  Follow-up Date: 12/21/14 Follow-up type: In-patient    Katrine Coho 12/21/2014, 12:18 PM

## 2014-12-21 NOTE — Progress Notes (Signed)
Patient ID: [name], female   DOB: 1990-06-29, 24 y.o.   MRN: [id number] Postop day 3 Blood pressure 145/63 afebrile pulse 75 respiration 18 abdomen soft incision clean legs negative home today

## 2014-12-21 NOTE — Lactation Note (Addendum)
This note was copied from the chart of [name]. Lactation Consultation Note Experienced BF mom is pumping after BF great amount of colostrum. Has 4 bottle in refergerator. Mom called out wanting bottle of colostrum for FOB to feed baby. Baby had 11 % weight loss. Good output and BF well. Had 11 voids and 5 stools at 17 hrs old. I would to see more stools. I wondering about milk transfer? Attempted to assess oral cavity and assessed suck pattern. Appears OK. Baby sticks tongue out past gum line. Unable to see total tongue at this time.  Patient Name: Madison Markesia Kozal [id number] Date: 12/21/2014 Reason for consult: Follow-up assessment;Infant weight loss   Maternal Data    Feeding Feeding Type: Breast Milk Length of feed: 60 min  LATCH Score/Interventions Latch: Repeated attempts needed to sustain latch, nipple held in mouth throughout feeding, stimulation needed to elicit sucking reflex. Intervention(s): Adjust position;Assist with latch  Audible Swallowing: Spontaneous and intermittent  Type of Nipple: Everted at rest and after stimulation  Comfort (Breast/Nipple): Soft / non-tender  Problem noted: Mild/Moderate discomfort  Hold (Positioning): Assistance needed to correctly position infant at breast and maintain latch. Intervention(s): Support Pillows;Position options;Breastfeeding basics reviewed  LATCH Score: 8  Lactation Tools Discussed/Used Tools: Pump;Bottle Breast pump type: Double-Electric Breast Pump   Consult Status Consult Status: Follow-up Date: 12/21/14 Follow-up type: In-patient    Trevante Tennell, Elta Guadeloupe 12/21/2014, 1:46 AM

## 2014-12-21 NOTE — Lactation Note (Signed)
This note was copied from the chart of [name]. Lactation Consultation Note  Patient Name: Madison Judene Gahn [id number] Date: 12/21/2014 Reason for consult: Follow-up assessment;Infant weight loss;Infant < 6lbs Mom reports baby recently fed. LC encouraged Mom to call with next feeding for latch check due to weight loss. [place] phone number left for Mom to call.    Maternal Data    Feeding Feeding Type: Breast Fed Length of feed: 25 min  LATCH Score/Interventions                      Lactation Tools Discussed/Used Tools: Pump Breast pump type: Double-Electric Breast Pump   Consult Status Consult Status: Follow-up Date: 12/21/14 Follow-up type: In-patient    Katrine Coho 12/21/2014, 8:46 AM

## 2014-12-21 NOTE — Discharge Instructions (Signed)
Discharge instructions   You can wash your hair  Shower  Eat what you want  Drink what you want  See me in 6 weeks  Your ankles are going to swell more in the next 2 weeks than when pregnant  No sex for 6 weeks   Tyanna Hach A, MD 12/20/2014  Discharge instructions   You can wash your hair  Shower  Eat what you want  Drink what you want  See me in 6 weeks  Your ankles are going to swell more in the next 2 weeks than when pregnant  No sex for 6 weeks   Janssen Zee A, MD 12/21/2014

## 2014-12-21 NOTE — Discharge Summary (Signed)
Obstetric Discharge Summary Reason for Admission: cesarean section Prenatal Procedures: none Intrapartum Procedures: cesarean: low cervical, transverse Postpartum Procedures: none Complications-Operative and Postpartum: none HEMOGLOBIN  Date Value Ref Range Status  12/19/2014 8.9* 12.0 - 15.0 g/dL Final   HCT  Date Value Ref Range Status  12/19/2014 27.6* 36.0 - 46.0 % Final    Physical Exam:  General: alert Lochia: appropriate Uterine Fundus: firm Incision: healing well DVT Evaluation: No evidence of DVT seen on physical exam.  Discharge Diagnoses: Term Pregnancy-delivered  Discharge Information: Date: 12/21/2014 Activity: pelvic rest Diet: routine Medications: Percocet Condition: improved Instructions: refer to practice specific booklet Discharge to: home Follow-up Information    Follow up with Frederico Hamman, MD.   Specialty:  Obstetrics and Gynecology   Contact information:   [address] [place] [address] [phone]       Follow up with Woodard Perrell A, MD In 6 weeks.   Specialty:  Obstetrics and Gynecology   Contact information:   [address] [place] [address] [phone]       Newborn Data: Live born female  Birth Weight: 6 lb 7.2 oz (2925 g) APGAR: 8, 9  Home with mother.  [name] 12/21/2014, 6:22 AM

## 2014-12-22 ENCOUNTER — Ambulatory Visit: Payer: Self-pay

## 2014-12-22 NOTE — Lactation Note (Addendum)
This note was copied from the chart of Madison [employer]. Lactation Consultation Note  Patient Name: [name] [id number] Date: 12/22/2014 Reason for consult: Follow-up assessment  Baby 12 days old, weighing less that 6 pounds, and a difficult latch on left breast. Mom reports that baby nursing well in general, but baby has had a more difficult time latching to left breast and that breast is comfortable. Mom states that she feels a lump on her chest above her left breast. Mom states she first noticed lump yesterday evening (12/21/14). This LC palpated a firm area just above mom's left breast, about 1 inch wide by 1.5 inches long--running down toward mom's breast. Had mom use a heat pack and massage the area while using DEBP. Mom had to pump left breast longer than right because milk continued to flow. Mom states that in addition to baby not nursing well on left breast, she had been pumping one breast at a time. Enc mom to pump both breast simultaneously after each time baby feeds. Mom able to pump over an ounce from each breast. Mom feels the hardened area a little smaller after pumping. Gave mom an ice pack to use on left breast for next 15 minutes, and then returned with another consultant Velta Addison) to see if lump reduced in size. Assisted mom to hand express, and areas surrounded the lump did seem reduced. Milk that was expressed was thicker than the EBM obtained while pumping.  Baby fussy while mom hand expressing, so baby given bottle of EBM, then assisted mom to latch baby to left breast because baby still cueing. Baby will latch to left breast, but does not want to stay latched. Plan is for mom to put baby to breast with cues, and wake to nurse by 3 hours. Enc mom to alternate breast at each feeding. Mom will then supplement with EBM/formula, and postpump until her milk stops flowing. Mom states that she has an appointment with [facility] on Monday (12-24-14) for a DEBP. Mom has paperwork for a [facility]  loaner, and was enc to call for the pump when FOB arrives with the money for the pump.   Enc mom to massage the firm area of her breast while baby nursing, and while using DEBP--as demonstrated. Enc mom to ice firm area between feedings/pumpings with bag of frozen peas or such--15 minutes on and 15 minutes off. Enc mom to monitor whether area staying the same, shrinking, or enlarging. Enc mom to treat and monitor for 24-48 and notify HCP, calling earlier if additional symptoms/concerns arise.    Maternal Data Has patient been taught Hand Expression?: Yes  Feeding Feeding Type: Breast Fed Length of feed: 25 min  LATCH Score/Interventions Latch: Repeated attempts needed to sustain latch, nipple held in mouth throughout feeding, stimulation needed to elicit sucking reflex. Intervention(s): Adjust position;Assist with latch;Breast compression  Audible Swallowing: Spontaneous and intermittent  Type of Nipple: Everted at rest and after stimulation  Comfort (Breast/Nipple): Filling, red/small blisters or bruises, mild/mod discomfort  Problem noted: Filling;Mild/Moderate discomfort Interventions (Filling): Massage Interventions (Mild/moderate discomfort): Pre-pump if needed  Hold (Positioning): No assistance needed to correctly position infant at breast.  LATCH Score: 8  Lactation Tools Discussed/Used     Consult Status Consult Status: PRN    Inocente Salles 12/22/2014, 11:28 AM

## 2015-01-07 ENCOUNTER — Emergency Department (HOSPITAL_COMMUNITY)
Admission: EM | Admit: 2015-01-07 | Discharge: 2015-01-07 | Disposition: A | Discharge status: Home / Self Care | Payer: Medicaid Other

## 2015-02-16 ENCOUNTER — Telehealth (HOSPITAL_COMMUNITY): Payer: Self-pay | Admitting: Lactation Services

## 2015-02-16 NOTE — Telephone Encounter (Signed)
Mom thinks that she has over supply, she has a lot of milk in the refrigerator and freezer. She is put baby to breast 3 times, she pumped 2 times , baby has eaten 7 times all in the last 24 hr. Mom offers 3-4 oz in each bottle. Baby has had 8 wets and 3 MBs. Mom is not sure about the feeding amount and the voids, she was guessing. [name] is 2 months old, but mom is just started having problems this past week when baby started day care. Mom stated that baby is sleeping through the night. Her breast are most full. Encouraged mom to remove milk 8+ times in 24 hr. Massage lumps while pumping, aim babies chin to the lumps when feeding. Discussed plugged and mastitis. Mom will make any OP apt if she has any more concerns.

## 2015-03-08 IMAGING — US US ABDOMEN COMPLETE
1 series · 13 of 25 positions shown · non-contrast
Comparison: None.

CLINICAL DATA: Abdominal pain and nausea.

EXAM:
ULTRASOUND ABDOMEN COMPLETE

[Series 1: us abdomen complete · 0.20mm/px · 13 of 82 slices shown]
[im 1/82]
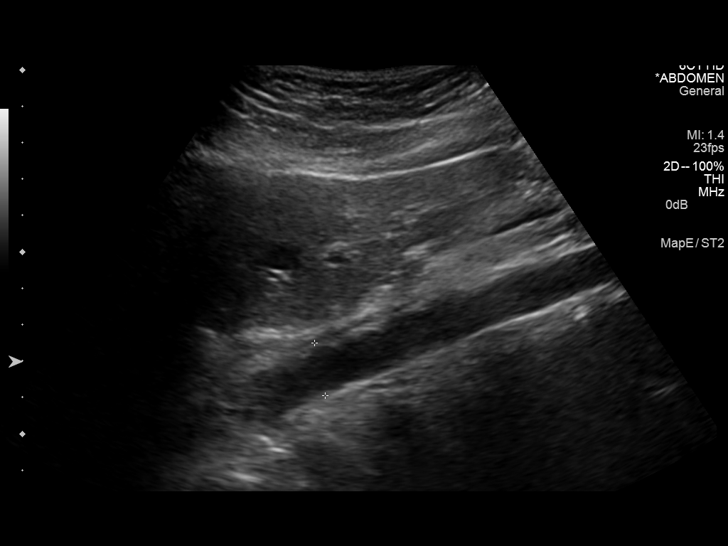
[im 7/82]
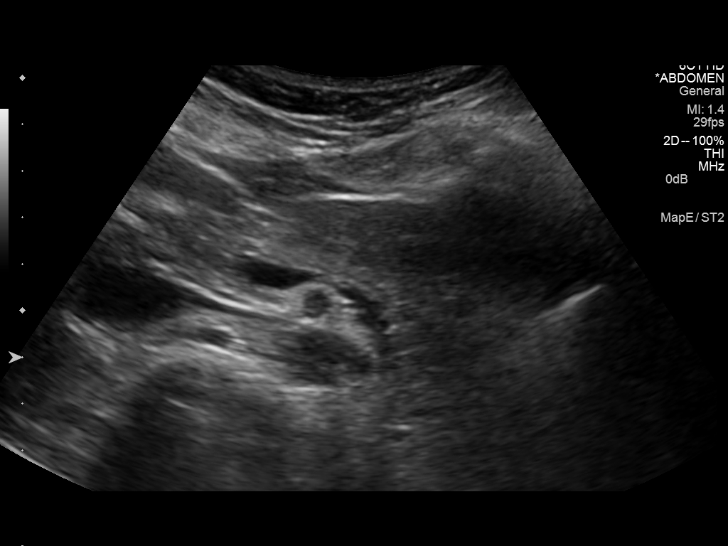
[im 14/82]
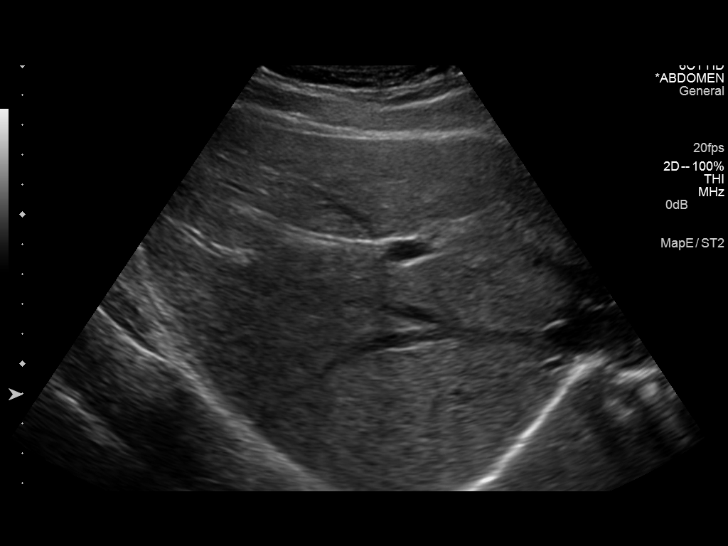
[im 21/82]
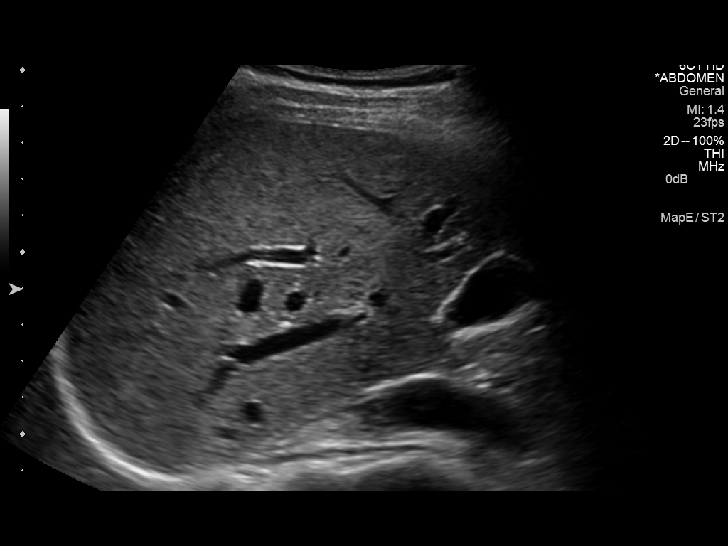
[im 28/82]
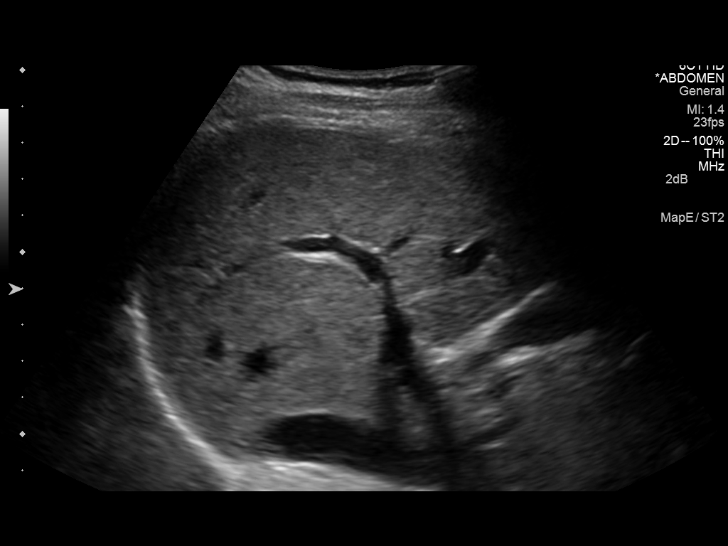
[im 34/82]
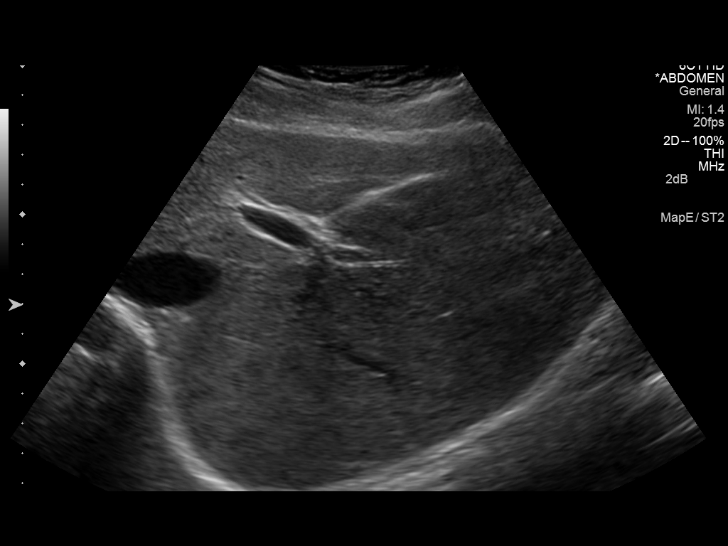
[im 41/82]
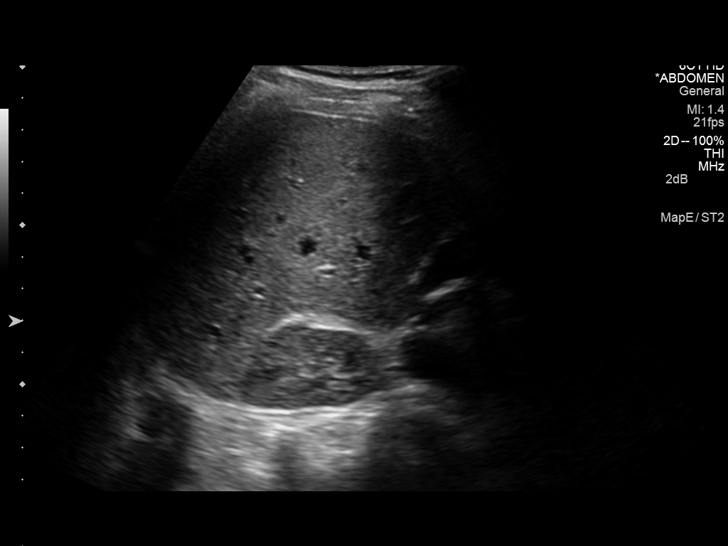
[im 48/82]
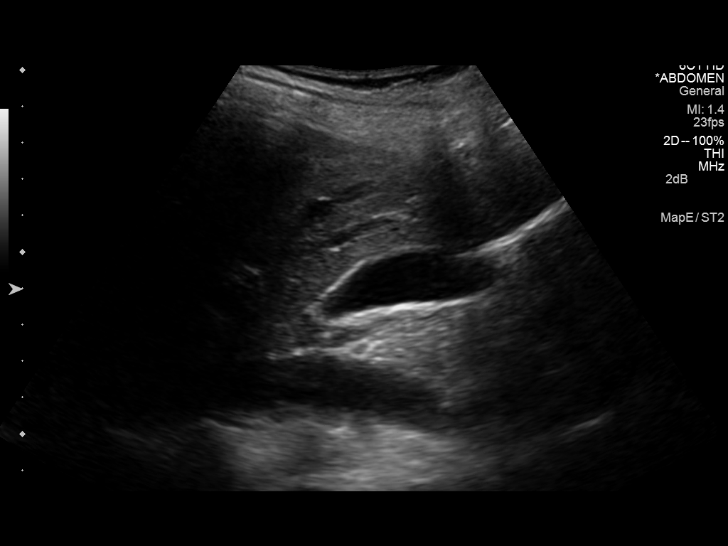
[im 55/82]
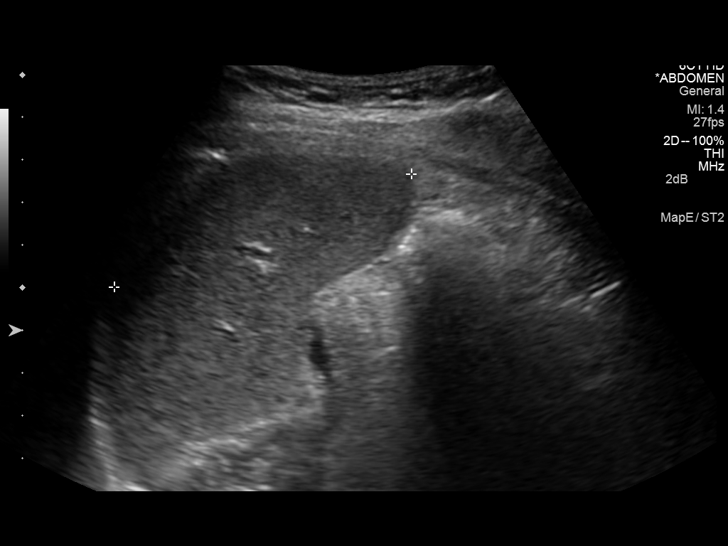
[im 61/82]
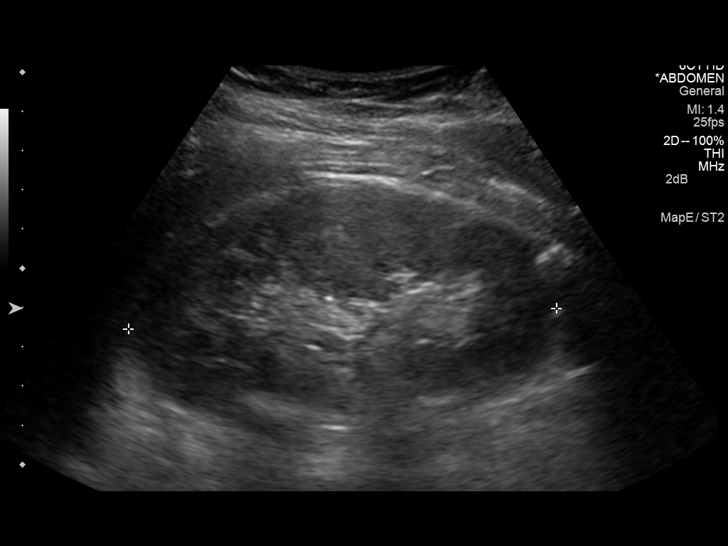
[im 68/82]
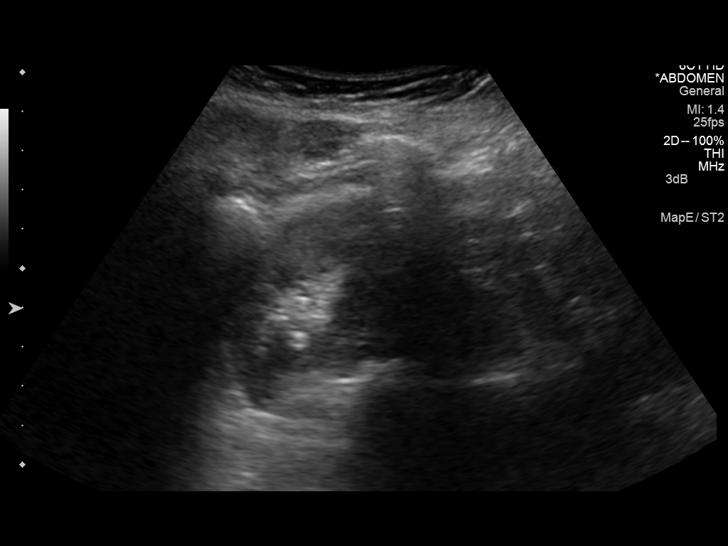
[im 75/82]
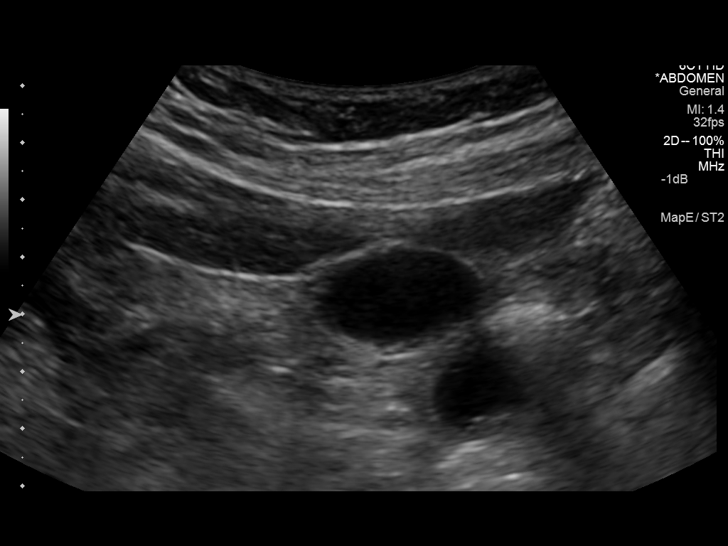
[im 82/82]
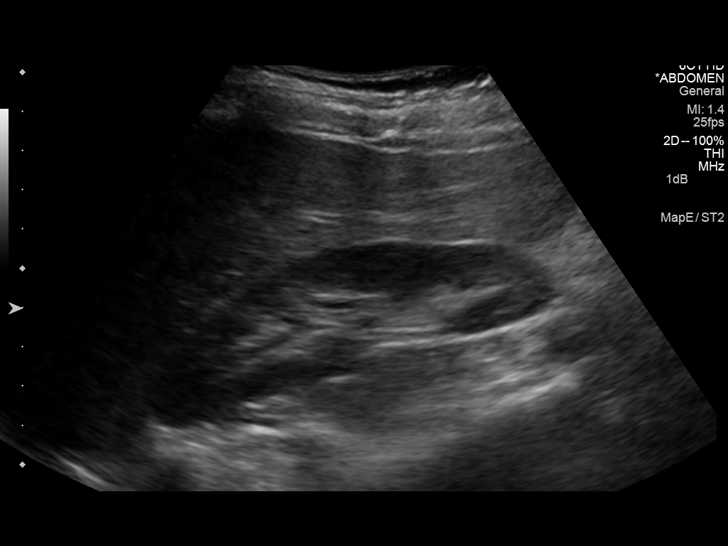

[13 of 25 positions shown; findings below may reference images not displayed]

FINDINGS: Gallbladder: Sonographically normal. No echogenic gallstones or gall
sludge. No gallbladder wall thickening or pericholecystic fluid.
Negative sonographic Murphy's sign.

Common bile duct: Diameter: Normal in size measuring 1.2 mm in
diameter

Liver: Homogeneous hepatic echotexture. No discrete hepatic lesions.
No definite evidence of intrahepatic biliary ductal dilatation. No
ascites.

IVC: No abnormality visualized.

Pancreas: Limited visualization of the pancreatic head and neck is
normal. Visualization of the pancreatic body and tail is obscured by
bowel gas.

Spleen: Normal in size measuring 7.5 cm in length

Right Kidney: Normal cortical thickness, echogenicity and size,
measuring 10.9 cm in length. No focal renal lesions. No echogenic
renal stones. No urinary obstruction.

Left Kidney: Normal cortical thickness, echogenicity and size,
measuring 10.9 cm in length. No focal renal lesions. No echogenic
renal stones. No urinary obstruction.

Abdominal aorta: No aneurysm visualized.

Other findings: None.
IMPRESSION: No explanation for patient's abdominal pain. Specifically, no
evidence of cholelithiasis or urinary obstruction.

## 2015-07-08 ENCOUNTER — Emergency Department (HOSPITAL_COMMUNITY): Payer: Medicaid Other

## 2015-07-08 ENCOUNTER — Emergency Department (HOSPITAL_COMMUNITY)
Admission: EM | Admit: 2015-07-08 | Discharge: 2015-07-08 | Disposition: A | Discharge status: Home / Self Care | Payer: Medicaid Other | Attending: Emergency Medicine | Admitting: Emergency Medicine

## 2015-07-08 ENCOUNTER — Encounter (HOSPITAL_COMMUNITY): Payer: Self-pay | Admitting: *Deleted

## 2015-07-08 DIAGNOSIS — Y998 Other external cause status: Secondary | ICD-10-CM | POA: Diagnosis not present

## 2015-07-08 DIAGNOSIS — Z862 Personal history of diseases of the blood and blood-forming organs and certain disorders involving the immune mechanism: Secondary | ICD-10-CM | POA: Insufficient documentation

## 2015-07-08 DIAGNOSIS — S199XXA Unspecified injury of neck, initial encounter: Secondary | ICD-10-CM | POA: Insufficient documentation

## 2015-07-08 DIAGNOSIS — F419 Anxiety disorder, unspecified: Secondary | ICD-10-CM | POA: Diagnosis not present

## 2015-07-08 DIAGNOSIS — S3992XA Unspecified injury of lower back, initial encounter: Secondary | ICD-10-CM | POA: Diagnosis not present

## 2015-07-08 DIAGNOSIS — S29001A Unspecified injury of muscle and tendon of front wall of thorax, initial encounter: Secondary | ICD-10-CM | POA: Diagnosis present

## 2015-07-08 DIAGNOSIS — Z87891 Personal history of nicotine dependence: Secondary | ICD-10-CM | POA: Diagnosis not present

## 2015-07-08 DIAGNOSIS — F329 Major depressive disorder, single episode, unspecified: Secondary | ICD-10-CM | POA: Insufficient documentation

## 2015-07-08 DIAGNOSIS — Z79899 Other long term (current) drug therapy: Secondary | ICD-10-CM | POA: Insufficient documentation

## 2015-07-08 DIAGNOSIS — Y92410 Unspecified street and highway as the place of occurrence of the external cause: Secondary | ICD-10-CM | POA: Diagnosis not present

## 2015-07-08 DIAGNOSIS — Y9389 Activity, other specified: Secondary | ICD-10-CM | POA: Diagnosis not present

## 2015-07-08 DIAGNOSIS — Z8719 Personal history of other diseases of the digestive system: Secondary | ICD-10-CM | POA: Insufficient documentation

## 2015-07-08 DIAGNOSIS — R0789 Other chest pain: Secondary | ICD-10-CM

## 2015-07-08 DIAGNOSIS — J45909 Unspecified asthma, uncomplicated: Secondary | ICD-10-CM | POA: Diagnosis not present

## 2015-07-08 MED ORDER — IBUPROFEN 200 MG PO TABS
400.0000 mg | ORAL_TABLET | Freq: Once | ORAL | Status: AC
  Administered 2015-07-08: 400 mg via ORAL
  Filled 2015-07-08: qty 2

## 2015-07-08 NOTE — ED Notes (Addendum)
Family given car seat and infant carrier.
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
## 2015-07-08 NOTE — ED Notes (Signed)
Pt ambulates to and from bathroom with no issue. Pt states, "Can't you give me something stronger than ibuprofen"? Pt informed she has no fx and is having muscle soreness and we try not to give narcotics unless absolutely necessary. Pt states, "Well ask the doctor, and if you aren't going to give me anything stronger then we'll just leave". [place], [place] informed.
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
## 2015-07-08 NOTE — ED Notes (Signed)
Pt is in stable condition upon d/c and ambulates from ED. Pt was transferring pt into the new carrier and stating, "I hope I get a survey, I hope I get a survey". Pt is very upset she wasn't given any narcotics for pain. Pt is also breastfeeding and PA didn't feel comfortable with prescribing anything stronger.

## 2015-08-19 ENCOUNTER — Ambulatory Visit: Payer: Self-pay | Admitting: Obstetrics & Gynecology

## 2015-11-18 ENCOUNTER — Other Ambulatory Visit: Payer: Self-pay | Admitting: Internal Medicine

## 2016-02-21 ENCOUNTER — Ambulatory Visit: Payer: Medicaid Other | Admitting: Family Medicine

## 2016-02-29 DIAGNOSIS — R0602 Shortness of breath: Secondary | ICD-10-CM | POA: Diagnosis not present

## 2016-03-17 ENCOUNTER — Emergency Department (HOSPITAL_COMMUNITY): Payer: Medicaid Other

## 2016-03-17 ENCOUNTER — Encounter (HOSPITAL_COMMUNITY): Payer: Self-pay | Admitting: Emergency Medicine

## 2016-03-17 ENCOUNTER — Emergency Department (HOSPITAL_COMMUNITY)
Admission: EM | Admit: 2016-03-17 | Discharge: 2016-03-17 | Disposition: A | Discharge status: Home / Self Care | Payer: Medicaid Other | Attending: Emergency Medicine | Admitting: Emergency Medicine

## 2016-03-17 DIAGNOSIS — S39012A Strain of muscle, fascia and tendon of lower back, initial encounter: Secondary | ICD-10-CM | POA: Diagnosis not present

## 2016-03-17 DIAGNOSIS — Y9239 Other specified sports and athletic area as the place of occurrence of the external cause: Secondary | ICD-10-CM | POA: Diagnosis not present

## 2016-03-17 DIAGNOSIS — X58XXXA Exposure to other specified factors, initial encounter: Secondary | ICD-10-CM | POA: Diagnosis not present

## 2016-03-17 DIAGNOSIS — Z87891 Personal history of nicotine dependence: Secondary | ICD-10-CM | POA: Diagnosis not present

## 2016-03-17 DIAGNOSIS — S3992XA Unspecified injury of lower back, initial encounter: Secondary | ICD-10-CM | POA: Diagnosis present

## 2016-03-17 DIAGNOSIS — M4107 Infantile idiopathic scoliosis, lumbosacral region: Secondary | ICD-10-CM | POA: Insufficient documentation

## 2016-03-17 DIAGNOSIS — Y999 Unspecified external cause status: Secondary | ICD-10-CM | POA: Insufficient documentation

## 2016-03-17 DIAGNOSIS — J45909 Unspecified asthma, uncomplicated: Secondary | ICD-10-CM | POA: Insufficient documentation

## 2016-03-17 DIAGNOSIS — M4100 Infantile idiopathic scoliosis, site unspecified: Secondary | ICD-10-CM

## 2016-03-17 DIAGNOSIS — Y9343 Activity, gymnastics: Secondary | ICD-10-CM | POA: Insufficient documentation

## 2016-03-17 LAB — URINALYSIS, ROUTINE W REFLEX MICROSCOPIC
Bilirubin Urine: NEGATIVE
GLUCOSE, UA: NEGATIVE mg/dL
Hgb urine dipstick: NEGATIVE
KETONES UR: NEGATIVE mg/dL
Nitrite: NEGATIVE
PH: 7 (ref 5.0–8.0)
Protein, ur: NEGATIVE mg/dL
SPECIFIC GRAVITY, URINE: 1.018 (ref 1.005–1.030)

## 2016-03-17 LAB — PREGNANCY, URINE: Preg Test, Ur: NEGATIVE

## 2016-03-17 MED ORDER — CYCLOBENZAPRINE HCL 10 MG PO TABS: 10.0000 mg | ORAL_TABLET | Freq: Two times a day (BID) | ORAL | 0 refills | Status: DC | PRN

## 2016-03-17 MED ORDER — KETOROLAC TROMETHAMINE 60 MG/2ML IM SOLN
30.0000 mg | Freq: Once | INTRAMUSCULAR | Status: AC
  Administered 2016-03-17: 30 mg via INTRAMUSCULAR
  Filled 2016-03-17: qty 2

## 2016-03-17 MED ORDER — DICLOFENAC SODIUM 50 MG PO TBEC: 50.0000 mg | DELAYED_RELEASE_TABLET | Freq: Two times a day (BID) | ORAL | 0 refills | Status: DC

## 2016-03-17 MED ORDER — CYCLOBENZAPRINE HCL 10 MG PO TABS
10.0000 mg | ORAL_TABLET | Freq: Once | ORAL | Status: AC
  Administered 2016-03-17: 10 mg via ORAL
  Filled 2016-03-17: qty 1

## 2016-03-17 MED ORDER — LIDOCAINE 5 % EX PTCH: 1.0000 | MEDICATED_PATCH | CUTANEOUS | 0 refills | Status: DC

## 2016-03-17 NOTE — Discharge Instructions (Signed)
Do not drive while taking the muscle relaxant as it will make you sleepy. Follow up with Dr. Percell Miller if symptoms persist.

## 2016-03-17 NOTE — ED Notes (Signed)
Pt c/o back pain since Sunday evening. Urged to provide urine specimen.

## 2016-03-17 NOTE — ED Provider Notes (Signed)
[place] DEPT Provider Note    By signing my name below, I, Bea Graff, attest that this documentation has been prepared under the direction and in the presence of [facility], [place]. Electronically Signed: Bea Graff, ED Scribe. 03/17/16. 6:16 PM.    History   Chief Complaint Chief Complaint  Patient presents with  . Back Pain   The history is provided by the patient and medical records. No language interpreter was used.    Rosina Demarinis is a 26 y.o. female who presents to the Emergency Department complaining of low back pain that began yesterday upon waking. She reports associated intermittent nausea and reports radiating pain down BLE to the ankles. She reports going to the gym three days ago and states she did cardio and lat pulls as she always does. Pt rates the pain at 9/10. She has taken Aleve for pain with minimal relief. Bending and lifting increases her pain. She denies alleviating factors. She denies dysuria, hematuria, frequency, loss of bowel or bladder function, numbness, tingling or weakness of BLE, sore throat, otalgia, neck pain. She states she has had similar pain in the past 2-3 months ago but this is more severe. She reports PMHx of asthma and reports using Symbicort daily. She is not on any birth control.   Past Medical History:  Diagnosis Date  . Anemia 2010  . Anxiety   . Asthma   . Depression    h/o pp depression after 1st pregnancy  . GERD (gastroesophageal reflux disease)     Patient Active Problem List   Diagnosis Date Noted  . Pain in joint, shoulder region 12/22/2012  . S/P cesarean section 07/02/2012  . 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 Asthma 02/17/2012  . Nausea/vomiting in pregnancy 11/05/2011    Past Surgical History:  Procedure Laterality Date  . CESAREAN SECTION  11/01/2008  . CESAREAN SECTION N/A 06/29/2012   Procedure: CESAREAN SECTION;  Surgeon: Melina Schools, MD;  Location: [facility];  Service: Obstetrics;  Laterality: N/A;  1  1/2 hrs OR time   . CESAREAN SECTION N/A 12/18/2014   Procedure: REPEAT CESAREAN SECTION;  Surgeon: Frederico Hamman, MD;  Location: [facility];  Service: Obstetrics;  Laterality: N/A;  . WISDOM TOOTH EXTRACTION      OB History    Gravida Para Term Preterm AB Living   4 3 3  0 1 1   SAB TAB Ectopic Multiple Live Births   0 1 0 0 1       Home Medications    Prior to Admission medications   Medication Sig Start Date End Date Taking? Authorizing Provider  albuterol (PROVENTIL HFA;VENTOLIN HFA) 108 (90 Base) MCG/ACT inhaler Inhale 2 puffs into the lungs every 6 (six) hours as needed for wheezing or shortness of breath.    Historical Provider, MD  ALPRAZolam Duanne Moron) 0.5 MG tablet Take 0.5 mg by mouth 3 (three) times daily as needed for anxiety.    Historical Provider, MD  sertraline (ZOLOFT) 25 MG tablet Take 25 mg by mouth daily.    Historical Provider, MD    Family History Family History  Problem Relation Age of Onset  . Asthma Mother   . Arthritis Mother   . Diabetes Mother   . Hypertension Mother   . Allergies Mother   . Hypertension Father   . Asthma Sister   . Allergies Son   . Asthma Son   . Other Neg Hx     Social History Social History  Substance Use  Topics  . Smoking status: Former Smoker    Packs/day: 0.25    Years: 2.00    Types: Cigarettes    Quit date: 04/14/2014  . Smokeless tobacco: Never Used  . Alcohol use No     Allergies   Patient has no known allergies.   Review of Systems Review of Systems  Constitutional: Negative for activity change, chills and fever.  Respiratory: Negative for shortness of breath.   Cardiovascular: Negative for chest pain.  Gastrointestinal: Positive for nausea. Negative for abdominal pain, constipation, diarrhea and vomiting.       No bowel or bladder incontinence  Genitourinary: Negative for decreased urine volume, dysuria, frequency, hematuria and urgency.  Musculoskeletal: Positive for arthralgias and back pain.  Negative for gait problem and neck pain.  Skin: Negative for rash and wound.  Neurological: Negative for light-headedness, numbness and headaches.  Psychiatric/Behavioral: Negative for confusion.     Physical Exam Updated Vital Signs BP 114/73 (BP Location: Left Arm)   Pulse 90   Temp 97.7 F (36.5 C) (Oral)   Resp 20   Ht 5\' 5"  (1.651 m)   Wt 180 lb (81.6 kg)   LMP 03/10/2016 (Approximate)   SpO2 100%   BMI 29.95 kg/m   Physical Exam  Constitutional: She is oriented to person, place, and time. She appears well-developed and well-nourished.  HENT:  Head: Normocephalic and atraumatic.  Right Ear: Tympanic membrane normal.  Left Ear: Tympanic membrane normal.  Mouth/Throat: Uvula is midline, oropharynx is clear and moist and mucous membranes are normal.  Eyes: EOM are normal. Pupils are equal, round, and reactive to light.  Sclera clear bilaterally.  Neck: Normal range of motion. Neck supple.  Cardiovascular: Normal rate and regular rhythm.   Dorsalis Pedis pulses 2+ bilaterally. Radial pulses 2+ bilaterally. Adequate circulation.  Pulmonary/Chest: Effort normal and breath sounds normal.  Abdominal: Soft. Bowel sounds are normal. There is no tenderness. There is no CVA tenderness.  Musculoskeletal: She exhibits tenderness. She exhibits no deformity.  SLR without difficulty. No tenderness of cervical or thoracic spine. Tenderness over lumbar spine.  Neurological: She is alert and oriented to person, place, and time. No cranial nerve deficit.  DTRs equal and symmetric bilaterally. Grip strength equal. Tender to palpation of the lumbar spine with pain radiating to bilateral sciatic region.  Skin: Skin is warm and dry.  Psychiatric: She has a normal mood and affect. Her behavior is normal.  Nursing note and vitals reviewed.    ED Treatments / Results  DIAGNOSTIC STUDIES: Oxygen Saturation is 100% on RA, normal by my interpretation.   COORDINATION OF CARE: 5:26 PM- Will  order pain medication and muscle relaxer prior to imaging. Pt verbalizes understanding and agrees to plan.  Medications  cyclobenzaprine (FLEXERIL) tablet 10 mg (10 mg Oral Given 03/17/16 1813)  ketorolac (TORADOL) injection 30 mg (30 mg Intramuscular Given 03/17/16 1813)    Labs (all labs ordered are listed, but only abnormal results are displayed) Labs Reviewed  URINALYSIS, ROUTINE W REFLEX MICROSCOPIC - Abnormal; Notable for the following:       Result Value   APPearance HAZY (*)    Leukocytes, UA MODERATE (*)    Bacteria, UA FEW (*)    Squamous Epithelial / LPF 6-30 (*)    All other components within normal limits  PREGNANCY, URINE     Radiology Dg Lumbar Spine Complete  Result Date: 03/17/2016 CLINICAL DATA:  Lumbago with bilateral lower extremity radicular symptoms EXAM: LUMBAR SPINE - COMPLETE  4+ VIEW COMPARISON:  None. FINDINGS: Frontal, lateral, spot lumbosacral lateral, and bilateral oblique views were obtained. There are 5 non-rib-bearing lumbar type vertebral bodies. There is levoscoliosis with rotatory component. There is no fracture or spondylolisthesis. The disc spaces appear normal. There is no appreciable facet arthropathy. IMPRESSION: Scoliosis. No fracture or spondylolisthesis. No appreciable arthropathy. Electronically Signed   By: Lowella Grip III M.D.   On: 03/17/2016 18:13    Procedures Procedures (including critical care time)  Medications Ordered in ED Medications  cyclobenzaprine (FLEXERIL) tablet 10 mg (10 mg Oral Given 03/17/16 1813)  ketorolac (TORADOL) injection 30 mg (30 mg Intramuscular Given 03/17/16 1813)     Initial Impression / Assessment and Plan / ED Course  I have reviewed the triage vital signs and the nursing notes.  Pertinent labs & imaging results that were available during my care of the patient were reviewed by me and considered in my medical decision making (see chart for details).     Patient with low back pain radiating into  BLE that began yesterday. No neurological deficits and normal neuro exam. Patient is ambulatory. No loss of bowel or bladder control. No concern for cauda equina. No fever, night sweats, weight loss, h/o cancer, IVDA, no recent procedure to back. No urinary symptoms suggestive of UTI. Supportive care and return precaution discussed. Appears safe for discharge at this time. Follow up as indicated in discharge paperwork.  I personally performed the services described in this documentation, which was scribed in my presence. The recorded information has been reviewed and is accurate.   Final Clinical Impressions(s) / ED Diagnoses   Final diagnoses:  Lumbosacral strain, initial encounter  Infantile idiopathic scoliosis, unspecified spinal region    New Prescriptions New Prescriptions   No medications on file     [place], NP 03/21/16 [id number]    Gareth Morgan, MD 03/23/16 [phone]

## 2016-03-17 NOTE — ED Triage Notes (Signed)
Pt st's she went to the gym on Sat and started having lower back pain yesterday.  St's pain radiates into both legs.  Pt also c/o headache.

## 2016-03-17 NOTE — ED Notes (Signed)
Pt states she understands instructions. Home stable with steady gait. 

## 2016-03-23 ENCOUNTER — Ambulatory Visit: Payer: Medicaid Other | Admitting: Obstetrics

## 2016-08-24 DIAGNOSIS — J45909 Unspecified asthma, uncomplicated: Secondary | ICD-10-CM | POA: Diagnosis not present

## 2016-08-24 DIAGNOSIS — F419 Anxiety disorder, unspecified: Secondary | ICD-10-CM | POA: Diagnosis not present

## 2016-08-24 DIAGNOSIS — B379 Candidiasis, unspecified: Secondary | ICD-10-CM | POA: Diagnosis not present

## 2017-04-05 ENCOUNTER — Other Ambulatory Visit: Payer: Self-pay | Admitting: Family Medicine

## 2017-04-05 DIAGNOSIS — N6310 Unspecified lump in the right breast, unspecified quadrant: Secondary | ICD-10-CM

## 2017-04-05 DIAGNOSIS — N644 Mastodynia: Secondary | ICD-10-CM

## 2017-04-13 ENCOUNTER — Other Ambulatory Visit: Payer: Self-pay

## 2017-04-27 ENCOUNTER — Other Ambulatory Visit: Payer: Self-pay

## 2017-05-04 DIAGNOSIS — Z30013 Encounter for initial prescription of injectable contraceptive: Secondary | ICD-10-CM | POA: Diagnosis not present

## 2017-05-04 DIAGNOSIS — Z3202 Encounter for pregnancy test, result negative: Secondary | ICD-10-CM | POA: Diagnosis not present

## 2017-05-04 DIAGNOSIS — Z113 Encounter for screening for infections with a predominantly sexual mode of transmission: Secondary | ICD-10-CM | POA: Diagnosis not present

## 2017-09-13 DIAGNOSIS — Z760 Encounter for issue of repeat prescription: Secondary | ICD-10-CM | POA: Diagnosis not present

## 2017-09-13 DIAGNOSIS — N760 Acute vaginitis: Secondary | ICD-10-CM | POA: Diagnosis not present

## 2017-12-14 DIAGNOSIS — F430 Acute stress reaction: Secondary | ICD-10-CM | POA: Diagnosis not present

## 2017-12-14 DIAGNOSIS — F320 Major depressive disorder, single episode, mild: Secondary | ICD-10-CM | POA: Diagnosis not present

## 2017-12-14 DIAGNOSIS — F419 Anxiety disorder, unspecified: Secondary | ICD-10-CM | POA: Diagnosis not present

## 2018-01-03 DIAGNOSIS — F320 Major depressive disorder, single episode, mild: Secondary | ICD-10-CM | POA: Diagnosis not present

## 2018-01-03 DIAGNOSIS — J4520 Mild intermittent asthma, uncomplicated: Secondary | ICD-10-CM | POA: Diagnosis not present

## 2018-01-03 DIAGNOSIS — F419 Anxiety disorder, unspecified: Secondary | ICD-10-CM | POA: Diagnosis not present

## 2018-02-22 ENCOUNTER — Inpatient Hospital Stay (HOSPITAL_COMMUNITY)
Admission: RE | Admit: 2018-02-22 | Discharge: 2018-02-27 | DRG: 885 | Disposition: A | Discharge status: Home / Self Care | Payer: Medicaid Other | Attending: Psychiatry | Admitting: Psychiatry

## 2018-02-22 ENCOUNTER — Other Ambulatory Visit: Payer: Self-pay

## 2018-02-22 ENCOUNTER — Encounter (HOSPITAL_COMMUNITY): Payer: Self-pay | Admitting: Emergency Medicine

## 2018-02-22 ENCOUNTER — Emergency Department (HOSPITAL_COMMUNITY)
Admission: EM | Admit: 2018-02-22 | Discharge: 2018-02-22 | Discharge status: Against Medical Advice | Payer: Medicaid Other | Attending: Emergency Medicine | Admitting: Emergency Medicine

## 2018-02-22 ENCOUNTER — Encounter (HOSPITAL_COMMUNITY): Payer: Self-pay | Admitting: *Deleted

## 2018-02-22 DIAGNOSIS — F332 Major depressive disorder, recurrent severe without psychotic features: Secondary | ICD-10-CM | POA: Diagnosis present

## 2018-02-22 DIAGNOSIS — F320 Major depressive disorder, single episode, mild: Secondary | ICD-10-CM | POA: Diagnosis not present

## 2018-02-22 DIAGNOSIS — D509 Iron deficiency anemia, unspecified: Secondary | ICD-10-CM | POA: Diagnosis present

## 2018-02-22 DIAGNOSIS — F4310 Post-traumatic stress disorder, unspecified: Secondary | ICD-10-CM | POA: Diagnosis present

## 2018-02-22 DIAGNOSIS — F1310 Sedative, hypnotic or anxiolytic abuse, uncomplicated: Secondary | ICD-10-CM | POA: Diagnosis present

## 2018-02-22 DIAGNOSIS — Z79899 Other long term (current) drug therapy: Secondary | ICD-10-CM | POA: Insufficient documentation

## 2018-02-22 DIAGNOSIS — Z833 Family history of diabetes mellitus: Secondary | ICD-10-CM

## 2018-02-22 DIAGNOSIS — Z87891 Personal history of nicotine dependence: Secondary | ICD-10-CM | POA: Insufficient documentation

## 2018-02-22 DIAGNOSIS — F419 Anxiety disorder, unspecified: Secondary | ICD-10-CM | POA: Diagnosis not present

## 2018-02-22 DIAGNOSIS — K219 Gastro-esophageal reflux disease without esophagitis: Secondary | ICD-10-CM | POA: Diagnosis present

## 2018-02-22 DIAGNOSIS — R451 Restlessness and agitation: Secondary | ICD-10-CM | POA: Diagnosis present

## 2018-02-22 DIAGNOSIS — G4700 Insomnia, unspecified: Secondary | ICD-10-CM | POA: Diagnosis present

## 2018-02-22 DIAGNOSIS — Z91410 Personal history of adult physical and sexual abuse: Secondary | ICD-10-CM | POA: Diagnosis not present

## 2018-02-22 DIAGNOSIS — F330 Major depressive disorder, recurrent, mild: Secondary | ICD-10-CM | POA: Diagnosis present

## 2018-02-22 DIAGNOSIS — F410 Panic disorder [episodic paroxysmal anxiety] without agoraphobia: Secondary | ICD-10-CM

## 2018-02-22 DIAGNOSIS — J45909 Unspecified asthma, uncomplicated: Secondary | ICD-10-CM | POA: Insufficient documentation

## 2018-02-22 DIAGNOSIS — R45851 Suicidal ideations: Secondary | ICD-10-CM | POA: Diagnosis present

## 2018-02-22 DIAGNOSIS — R440 Auditory hallucinations: Secondary | ICD-10-CM | POA: Diagnosis present

## 2018-02-22 DIAGNOSIS — F1320 Sedative, hypnotic or anxiolytic dependence, uncomplicated: Secondary | ICD-10-CM | POA: Diagnosis not present

## 2018-02-22 DIAGNOSIS — R251 Tremor, unspecified: Secondary | ICD-10-CM | POA: Diagnosis present

## 2018-02-22 DIAGNOSIS — Z825 Family history of asthma and other chronic lower respiratory diseases: Secondary | ICD-10-CM | POA: Diagnosis not present

## 2018-02-22 DIAGNOSIS — F4001 Agoraphobia with panic disorder: Secondary | ICD-10-CM | POA: Diagnosis present

## 2018-02-22 DIAGNOSIS — Z8249 Family history of ischemic heart disease and other diseases of the circulatory system: Secondary | ICD-10-CM

## 2018-02-22 DIAGNOSIS — R002 Palpitations: Secondary | ICD-10-CM | POA: Diagnosis not present

## 2018-02-22 DIAGNOSIS — F430 Acute stress reaction: Secondary | ICD-10-CM | POA: Diagnosis not present

## 2018-02-22 DIAGNOSIS — Z8261 Family history of arthritis: Secondary | ICD-10-CM | POA: Diagnosis not present

## 2018-02-22 DIAGNOSIS — R4586 Emotional lability: Secondary | ICD-10-CM | POA: Diagnosis present

## 2018-02-22 DIAGNOSIS — F3342 Major depressive disorder, recurrent, in full remission: Secondary | ICD-10-CM | POA: Diagnosis present

## 2018-02-22 DIAGNOSIS — R61 Generalized hyperhidrosis: Secondary | ICD-10-CM | POA: Diagnosis present

## 2018-02-22 DIAGNOSIS — F1390 Sedative, hypnotic, or anxiolytic use, unspecified, uncomplicated: Secondary | ICD-10-CM

## 2018-02-22 DIAGNOSIS — T424X1A Poisoning by benzodiazepines, accidental (unintentional), initial encounter: Secondary | ICD-10-CM | POA: Diagnosis not present

## 2018-02-22 LAB — COMPREHENSIVE METABOLIC PANEL
ALT: 26 U/L (ref 0–44)
AST: 23 U/L (ref 15–41)
Albumin: 4.7 g/dL (ref 3.5–5.0)
Alkaline Phosphatase: 32 U/L — ABNORMAL LOW (ref 38–126)
Anion gap: 9 (ref 5–15)
BUN: 9 mg/dL (ref 6–20)
CO2: 22 mmol/L (ref 22–32)
Calcium: 9.2 mg/dL (ref 8.9–10.3)
Chloride: 110 mmol/L (ref 98–111)
Creatinine, Ser: 0.85 mg/dL (ref 0.44–1.00)
Glucose, Bld: 100 mg/dL — ABNORMAL HIGH (ref 70–99)
Potassium: 3.9 mmol/L (ref 3.5–5.1)
Sodium: 141 mmol/L (ref 135–145)
Total Bilirubin: 2.1 mg/dL — ABNORMAL HIGH (ref 0.3–1.2)
Total Protein: 7.8 g/dL (ref 6.5–8.1)

## 2018-02-22 LAB — CBC
HCT: 41.6 % (ref 36.0–46.0)
Hemoglobin: 13.4 g/dL (ref 12.0–15.0)
MCH: 27.8 pg (ref 26.0–34.0)
MCHC: 32.2 g/dL (ref 30.0–36.0)
MCV: 86.3 fL (ref 80.0–100.0)
Platelets: 267 10*3/uL (ref 150–400)
RBC: 4.82 MIL/uL (ref 3.87–5.11)
RDW: 14.4 % (ref 11.5–15.5)
WBC: 4.1 10*3/uL (ref 4.0–10.5)
nRBC: 0 % (ref 0.0–0.2)

## 2018-02-22 LAB — ACETAMINOPHEN LEVEL: Acetaminophen (Tylenol), Serum: <10 ug/mL — ABNORMAL LOW (ref 10–30)

## 2018-02-22 LAB — I-STAT BETA HCG BLOOD, ED (MC, WL, AP ONLY): I-stat hCG, quantitative: <5 m[IU]/mL (ref <5)

## 2018-02-22 LAB — ETHANOL: Alcohol, Ethyl (B): <10 mg/dL (ref <10)

## 2018-02-22 LAB — SALICYLATE LEVEL

## 2018-02-22 MED ORDER — ALUM & MAG HYDROXIDE-SIMETH 200-200-20 MG/5ML PO SUSP
30.0000 mL | ORAL | Status: DC | PRN
  Administered 2018-02-24 – 2018-02-25 (×2): 30 mL via ORAL
  Filled 2018-02-22 (×2): qty 30

## 2018-02-22 MED ORDER — TRAZODONE HCL 50 MG PO TABS
50.0000 mg | ORAL_TABLET | Freq: Every evening | ORAL | Status: DC | PRN
  Administered 2018-02-22: 50 mg via ORAL
  Filled 2018-02-22 (×5): qty 1

## 2018-02-22 MED ORDER — HYDROXYZINE HCL 50 MG PO TABS
50.0000 mg | ORAL_TABLET | Freq: Four times a day (QID) | ORAL | Status: DC | PRN
  Administered 2018-02-22 – 2018-02-23 (×2): 50 mg via ORAL
  Filled 2018-02-22: qty 1

## 2018-02-22 MED ORDER — ACETAMINOPHEN 325 MG PO TABS
650.0000 mg | ORAL_TABLET | Freq: Four times a day (QID) | ORAL | Status: DC | PRN
  Administered 2018-02-25: 650 mg via ORAL
  Filled 2018-02-22: qty 2

## 2018-02-22 MED ORDER — MAGNESIUM HYDROXIDE 400 MG/5ML PO SUSP: 30.0000 mL | Freq: Every day | ORAL | Status: DC | PRN

## 2018-02-22 NOTE — ED Notes (Signed)
Walked in room to collect urine sample and found patient absent from room with belongings gone and gown on the floor.
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
## 2018-02-22 NOTE — BH Assessment (Signed)
Assessment Note  [name] is an 28 y.o. female patient presents as walk in at [facility] after walking out at [facility].  Patient states she is wanting detox form xanax.  Patient reported buying xanax from people and taking 2.5mg  xanax daily for the past 3 years. Patient was given 60 Klonopin by her PCP to assist with slow taper at home. Patient admitted to taking the Klonopin and not following the protocol. Patient denied SI, HI and psychosis. Clinician asked about stressors, patient stated "I can't focus can't relax, I feel that a lot of people don't like me, but I don't believe it I just think that way". Patient was asked do you feel people are trying to hurt you or kill you, patient stated, "no I don't think like that, no one is trying to hurt me". Patient reported withdrawal symptoms as "trembling, diarrhea and shaking". Patient denied self-harming behaviors and denied history of any suicide attempts. Patient denied history of inpatient mental health treatment. Patient denied receiving any outpatient mental health services at this time. Patient denied depressive symptoms, however did admit to not wanting to get out of bed in the mornings. Patient reported sleeping 4-5 hours nightly, stating "I go to bed at 3am and awake at 8:15am". Patient reported not having an appetite, stating she lost 36 lbs in approximately a month, stating "I normally drink liquids but will only take a bite of food and nothing else".   Patient resides with husband and 3 children ( 41, 30, 67 years old). Patient is currently employed at [employer] stating she works from home. Patient did not identify family or job as a stressor. Patient was cooperative during assessment. Patient was alert and oriented x4, judgement unimpaired, thought processes logical, mood/affect was sad.   Diagnosis: Benzodiazepines Withdrawal  Past Medical History:  Past Medical History:  Diagnosis Date  . Anemia 2010  . Anxiety   . Asthma   . Depression    h/o  pp depression after 1st pregnancy  . GERD (gastroesophageal reflux disease)     Past Surgical History:  Procedure Laterality Date  . CESAREAN SECTION  11/01/2008  . CESAREAN SECTION N/A 06/29/2012   Procedure: CESAREAN SECTION;  Surgeon: Melina Schools, MD;  Location: [facility];  Service: Obstetrics;  Laterality: N/A;  1 1/2 hrs OR time   . CESAREAN SECTION N/A 12/18/2014   Procedure: REPEAT CESAREAN SECTION;  Surgeon: Frederico Hamman, MD;  Location: [facility];  Service: Obstetrics;  Laterality: N/A;  . WISDOM TOOTH EXTRACTION      Family History:  Family History  Problem Relation Age of Onset  . Asthma Mother   . Arthritis Mother   . Diabetes Mother   . Hypertension Mother   . Allergies Mother   . Hypertension Father   . Asthma Sister   . Allergies Son   . Asthma Son   . Other Neg Hx     Social History:  reports that she quit smoking about 3 years ago. Her smoking use included cigarettes. She has a 0.50 pack-year smoking history. She has never used smokeless tobacco. She reports that she does not drink alcohol or use drugs.  Additional Social History:  Alcohol / Drug Use Pain Medications: see MAR Prescriptions: see MAR Over the Counter: see MAR  CIWA: CIWA-Ar BP: 110/78 COWS:    Allergies: No Known Allergies  Home Medications: (Not in a hospital admission)   OB/GYN Status:  No LMP recorded.  General Assessment Data Location of  Assessment: [facility] TTS Assessment: In system Is this a Tele or Face-to-Face Assessment?: Face-to-Face Is this an Initial Assessment or a Re-assessment for this encounter?: Initial Assessment Patient Accompanied by:: N/A Language Other than English: No Living Arrangements: (family home) What gender do you identify as?: Female Marital status: Married Pregnancy Status: (n/a) Living Arrangements: Children, Spouse/significant other(husband and 3 children (74, 34, 82 year old)) Can pt return to current living arrangement?:  Yes Admission Status: Voluntary Is patient capable of signing voluntary admission?: Yes Referral Source: Other([facility])  Medical Screening Exam ([place]) Medical Exam completed: Yes  Crisis Care Plan Living Arrangements: Children, Spouse/significant other(husband and 3 children (83, 60, 32 year old)) Legal Guardian: (self) Name of Psychiatrist: (none) Name of Therapist: (none)  Education Status Is patient currently in school?: No Is the patient employed, unemployed or receiving disability?: Employed  Risk to self with the past 6 months Suicidal Ideation: No Has patient been a risk to self within the past 6 months prior to admission? : No Suicidal Intent: No Has patient had any suicidal intent within the past 6 months prior to admission? : No Is patient at risk for suicide?: No Suicidal Plan?: No Has patient had any suicidal plan within the past 6 months prior to admission? : No Access to Means: No What has been your use of drugs/alcohol within the last 12 months?: (Zanax) Previous Attempts/Gestures: No How many times?: (0) Other Self Harm Risks: (0) Triggers for Past Attempts: (n/a) Intentional Self Injurious Behavior: None Family Suicide History: No Recent stressful life event(s): (Zanax withdrawals) Persecutory voices/beliefs?: No Depression: No Depression Symptoms: (denied) Substance abuse history and/or treatment for substance abuse?: No  Risk to Others within the past 6 months Homicidal Ideation: No Does patient have any lifetime risk of violence toward others beyond the six months prior to admission? : No Thoughts of Harm to Others: No Current Homicidal Intent: No Current Homicidal Plan: No Access to Homicidal Means: No Identified Victim: (0) History of harm to others?: No Assessment of Violence: None Noted Violent Behavior Description: (0) Does patient have access to weapons?: No Criminal Charges Pending?: No Does patient  have a court date: No Is patient on probation?: No  Psychosis Hallucinations: None noted Delusions: None noted  Mental Status Report Appearance/Hygiene: Unremarkable Eye Contact: Fair Motor Activity: Freedom of movement Speech: Logical/coherent Level of Consciousness: Alert Mood: Sad Affect: Sad Anxiety Level: Minimal Thought Processes: Coherent, Relevant Judgement: Unimpaired Orientation: Person, Place, Time, Situation Obsessive Compulsive Thoughts/Behaviors: None  Cognitive Functioning Concentration: Fair Memory: Recent Intact, Remote Intact Is patient IDD: No Insight: Fair Impulse Control: Fair Appetite: Poor Have you had any weight changes? : Loss Amount of the weight change? (lbs): (loss 37 lbs) Sleep: Decreased Total Hours of Sleep: (4-5) Vegetative Symptoms: Staying in bed  ADLScreening [facility] Assessment Services) Patient's cognitive ability adequate to safely complete daily activities?: Yes Patient able to express need for assistance with ADLs?: Yes Independently performs ADLs?: Yes (appropriate for developmental age)  Prior Inpatient Therapy Prior Inpatient Therapy: No  Prior Outpatient Therapy Prior Outpatient Therapy: No Does patient have an ACCT team?: No Does patient have Intensive In-House Services?  : No Does patient have [facility] services? : No Does patient have P4CC services?: No  ADL Screening (condition at time of admission) Patient's cognitive ability adequate to safely complete daily activities?: Yes Patient able to express need for assistance with ADLs?: Yes Independently performs ADLs?: Yes (appropriate for developmental age)  Disposition:  Disposition Initial Assessment Completed for this Encounter: Yes  Shuvon Rankin, NP, patient does not meet inpatient criteria. Patient instructed to follow up with her PCP, Dr. Loma Sousa at [facility] for protocol, if she feels she needs more klonopin to follow protocol.  On Site Evaluation  by:   Reviewed with Physician:    Venora Maples, [facility] 02/22/2018 7:23 PM

## 2018-02-22 NOTE — ED Notes (Signed)
This RN notified that patient was not in room. MD [facility] notified of patients departure.

## 2018-02-22 NOTE — Tx Team (Signed)
Initial Treatment Plan 02/22/2018 10:11 PM [name] [id number]    PATIENT STRESSORS: Marital or family conflict Substance abuse   PATIENT STRENGTHS: Ability for insight Average or above average intelligence Capable of independent living Communication skills General fund of knowledge Motivation for treatment/growth Physical Health   PATIENT IDENTIFIED PROBLEMS: Depression Anxiety Suicidal thoughts "I want to stop having panic attacks" "I just want to be normal"                     DISCHARGE CRITERIA:  Ability to meet basic life and health needs Improved stabilization in mood, thinking, and/or behavior Reduction of life-threatening or endangering symptoms to within safe limits Verbal commitment to aftercare and medication compliance Withdrawal symptoms are absent or subacute and managed without 24-hour nursing intervention  PRELIMINARY DISCHARGE PLAN: Attend aftercare/continuing care group Return to previous living arrangement  PATIENT/FAMILY INVOLVEMENT: This treatment plan has been presented to and reviewed with the patient, [name], and/or family member, .  The patient and family have been given the opportunity to ask questions and make suggestions.  [place], [place], [place] 02/22/2018, 10:11 PM

## 2018-02-22 NOTE — ED Triage Notes (Signed)
Pt reports she took 9 1 mg clonazepam over the course of a few days today she reports taking 3 in efforts to help with withdrawal from xanax. Pt is very tearful at triage. Denies SI/HI, states "I just don't want to have to take it." reporting sweats, heart beating fast and diarrhea.

## 2018-02-22 NOTE — ED Provider Notes (Signed)
[facility] DEPART[facility] Provider Note   CSN: [id number] Arrival date & time: 02/22/18  1351     History   Chief Complaint Chief Complaint  Patient presents with  . Ingestion  . Withdrawal    HPI [name] is a 28 y.o. female.  HPI 28 year old female presents the emergency department with complaints of overuse of clonazepam.  She states she has been purchasing this off the street in order to help deal with stress and anxiety in her life.  She denies homicidal or suicidal thoughts.  She states that she read on the Internet the best way to get off the medication is to slowly taper but she reports that tapering is making her feel poorly.  She is very tearful and reports occasional palpitations and sweats and diarrhea.  She denies vomiting.   Past Medical History:  Diagnosis Date  . Anemia 2010  . Anxiety   . Asthma   . Depression    h/o pp depression after 1st pregnancy  . GERD (gastroesophageal reflux disease)     Patient Active Problem List   Diagnosis Date Noted  . Pain in joint, shoulder region 12/22/2012  . S/P cesarean section 07/02/2012  . 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 Asthma 02/17/2012  . Nausea/vomiting in pregnancy 11/05/2011    Past Surgical History:  Procedure Laterality Date  . CESAREAN SECTION  11/01/2008  . CESAREAN SECTION N/A 06/29/2012   Procedure: CESAREAN SECTION;  Surgeon: Melina Schools, MD;  Location: [facility];  Service: Obstetrics;  Laterality: N/A;  1 1/2 hrs OR time   . CESAREAN SECTION N/A 12/18/2014   Procedure: REPEAT CESAREAN SECTION;  Surgeon: Frederico Hamman, MD;  Location: [facility];  Service: Obstetrics;  Laterality: N/A;  . WISDOM TOOTH EXTRACTION       OB History    Gravida  4   Para  3   Term  3   Preterm  0   AB  1   Living  1     SAB  0   TAB  1   Ectopic  0   Multiple  0   Live Births  1            Home Medications    Prior to Admission medications   Medication  Sig Start Date End Date Taking? Authorizing Provider  albuterol (PROVENTIL HFA;VENTOLIN HFA) 108 (90 Base) MCG/ACT inhaler Inhale 2 puffs into the lungs every 6 (six) hours as needed for wheezing or shortness of breath.    [provider]  ALPRAZolam Duanne Moron) 0.5 MG tablet Take 0.5 mg by mouth 3 (three) times daily as needed for anxiety.    [provider]  cyclobenzaprine (FLEXERIL) 10 MG tablet Take 1 tablet (10 mg total) by mouth 2 (two) times daily as needed for muscle spasms. 03/17/16   Ashley Murrain, NP  diclofenac (VOLTAREN) 50 MG EC tablet Take 1 tablet (50 mg total) by mouth 2 (two) times daily. 03/17/16   Ashley Murrain, NP  lidocaine (LIDODERM) 5 % Place 1 patch onto the skin daily. Remove & Discard patch within 12 hours or as directed by MD 03/17/16   Ashley Murrain, NP  sertraline (ZOLOFT) 25 MG tablet Take 25 mg by mouth daily.    [provider]  sucralfate (CARAFATE) 1 G tablet Take 1 tablet (1 g total) by mouth 4 (four) times daily -  with meals and at bedtime. Patient not taking: Reported  on 04/24/2014 02/15/14 04/24/14  Dalia Heading, PA-C    Family History Family History  Problem Relation Age of Onset  . Asthma Mother   . Arthritis Mother   . Diabetes Mother   . Hypertension Mother   . Allergies Mother   . Hypertension Father   . Asthma Sister   . Allergies Son   . Asthma Son   . Other Neg Hx     Social History Social History   Tobacco Use  . Smoking status: Former Smoker    Packs/day: 0.25    Years: 2.00    Pack years: 0.50    Types: Cigarettes    Last attempt to quit: 04/14/2014    Years since quitting: 3.8  . Smokeless tobacco: Never Used  Substance Use Topics  . Alcohol use: No  . Drug use: No    Types: Marijuana    Comment: last use March 2016     Allergies   Patient has no known allergies.   Review of Systems Review of Systems  All other systems reviewed and are negative.    Physical Exam Updated Vital Signs BP  107/78   Pulse 68   Temp 98 F (36.7 C) (Oral)   Resp 19   SpO2 100%   Physical Exam Vitals signs and nursing note reviewed.  Constitutional:      General: She is not in acute distress.    Appearance: She is well-developed.  HENT:     Head: Normocephalic and atraumatic.  Neck:     Musculoskeletal: Normal range of motion.  Cardiovascular:     Rate and Rhythm: Normal rate and regular rhythm.     Heart sounds: Normal heart sounds.  Pulmonary:     Effort: Pulmonary effort is normal.     Breath sounds: Normal breath sounds.  Abdominal:     General: There is no distension.     Palpations: Abdomen is soft.     Tenderness: There is no abdominal tenderness.  Musculoskeletal: Normal range of motion.  Skin:    General: Skin is warm and dry.  Neurological:     Mental Status: She is alert.  Psychiatric:     Comments: Tearful. No HI or SI      ED Treatments / Results  Labs (all labs ordered are listed, but only abnormal results are displayed) Labs Reviewed  COMPREHENSIVE METABOLIC PANEL - Abnormal; Notable for the following components:      Result Value   Glucose, Bld 100 (*)    Alkaline Phosphatase 32 (*)    Total Bilirubin 2.1 (*)    All other components within normal limits  ACETAMINOPHEN LEVEL - Abnormal; Notable for the following components:   Acetaminophen (Tylenol), Serum <10 (*)    All other components within normal limits  ETHANOL  SALICYLATE LEVEL  CBC  RAPID URINE DRUG SCREEN, HOSP PERFORMED  I-STAT BETA HCG BLOOD, ED ([facility], [facility], [facility] ONLY)  CBG MONITORING, ED    EKG None  Radiology No results found.  Procedures Procedures (including critical care time)  Medications Ordered in ED Medications - No data to display   Initial Impression / Assessment and Plan / ED Course  I have reviewed the triage vital signs and the nursing notes.  Pertinent labs & imaging results that were available during my care of the patient were reviewed by me and considered in my  medical decision making (see chart for details).     Patient will be observed here in the emergency  department.  She is tearful.  Her vital signs are stable.  There is no homicidal or suicidal thoughts.  4:16 PM I was alerted by nursing staff that the patient has left the emergency department.  Unfortunately I was unable to reevaluate the patient and readdress her symptoms of benzodiazepine misuse.  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

## 2018-02-22 NOTE — ED Notes (Signed)
Upon walking patient back to lobby so she can exit the building, patient was hesitant and stated she feels she needs to spend the night. Patient then stated that she doesn't know what to do stating, "I didn't circle everything on that paper" patient declined to disclose additional information to clinician. Clinician encouraged client and welcomed her to complete another form with truthful information on the form so we can do a thorough assessment and for patient not to withhold information from clinician during assessment so the appropriate determination of treatment can be made. Patient agreed that she will think about completing form so we can complete a thorough assessment.

## 2018-02-22 NOTE — H&P (Signed)
Behavioral Health Medical Screening Exam  [name] is an 28 y.o. female patient presents as walk in at [facility] after walking out at [facility].  Patient states she is wanting detox form xanax.  Patient was given 30 Klonopin by her PCP to assist with slow taper at home.  Patient instructed to follow up back with her PCP related to she feels she needs more of the klonopin than she did with the xanax.  Patient denies suicidal/homicidal ideation, psychosis, and paranoia  Total Time spent with patient: 30 minutes  Psychiatric Specialty Exam: Physical Exam  Nursing note and vitals reviewed. Constitutional: She is oriented to person, place, and time. She appears well-developed and well-nourished. No distress.  Neck: Normal range of motion. Neck supple.  Respiratory: Effort normal.  Musculoskeletal: Normal range of motion.  Neurological: She is alert and oriented to person, place, and time.  Skin: Skin is warm and dry.  Psychiatric: She has a normal mood and affect. Her behavior is normal. Judgment normal. Thought content is not paranoid. Cognition and memory are normal. She expresses no homicidal and no suicidal ideation.    Review of Systems  Psychiatric/Behavioral: Positive for hallucinations. Negative for depression, memory loss, substance abuse and suicidal ideas. The patient is nervous/anxious. The patient does not have insomnia.   All other systems reviewed and are negative.   Blood pressure 110/78, resp. rate 18, SpO2 100 %, unknown if currently breastfeeding.There is no height or weight on file to calculate BMI.  General Appearance: Casual  Eye Contact:  Good  Speech:  Clear and Coherent and Normal Rate  Volume:  Normal  Mood:  Appropriate  Affect:  Appropriate and Congruent  Thought Process:  Coherent and Goal Directed  Orientation:  Full (Time, Place, and Person)  Thought Content:  WDL and Logical  Suicidal Thoughts:  No  Homicidal Thoughts:  No  Memory:  Immediate;    Good Recent;   Good Remote;   Good  Judgement:  Intact  Insight:  Present  Psychomotor Activity:  Normal  Concentration: Concentration: Good and Attention Span: Good  Recall:  Good  Fund of Knowledge:Good  Language: Good  Akathisia:  No  Handed:  Right  AIMS (if indicated):     Assets:  Communication Skills Desire for Improvement Financial Resources/Insurance Intimacy Social Support  Sleep:       Musculoskeletal: Strength & Muscle Tone: within normal limits Gait & Station: normal Patient leans: N/A  Blood pressure 110/78, resp. rate 18, SpO2 100 %, unknown if currently breastfeeding.  Recommendations:  Follow up with PCP; give referral/resources for outpatient substance use disorder.   Based on my evaluation the patient does not appear to have an emergency medical condition.  Madison Aumiller, NP 02/22/2018, 7:21 PM
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
## 2018-02-23 DIAGNOSIS — F1320 Sedative, hypnotic or anxiolytic dependence, uncomplicated: Secondary | ICD-10-CM

## 2018-02-23 DIAGNOSIS — F410 Panic disorder [episodic paroxysmal anxiety] without agoraphobia: Secondary | ICD-10-CM

## 2018-02-23 DIAGNOSIS — F332 Major depressive disorder, recurrent severe without psychotic features: Principal | ICD-10-CM

## 2018-02-23 LAB — COMPREHENSIVE METABOLIC PANEL
ALBUMIN: 4.4 g/dL (ref 3.5–5.0)
ALT: 24 U/L (ref 0–44)
AST: 17 U/L (ref 15–41)
Alkaline Phosphatase: 29 U/L — ABNORMAL LOW (ref 38–126)
Anion gap: 8 (ref 5–15)
BUN: 14 mg/dL (ref 6–20)
CO2: 23 mmol/L (ref 22–32)
Calcium: 9 mg/dL (ref 8.9–10.3)
Chloride: 109 mmol/L (ref 98–111)
Creatinine, Ser: 0.68 mg/dL (ref 0.44–1.00)
GFR calc Af Amer: >60 mL/min (ref >60)
GFR calc non Af Amer: >60 mL/min (ref >60)
Glucose, Bld: 89 mg/dL (ref 70–99)
Potassium: 3.3 mmol/L — ABNORMAL LOW (ref 3.5–5.1)
Sodium: 140 mmol/L (ref 135–145)
Total Bilirubin: 1.2 mg/dL (ref 0.3–1.2)
Total Protein: 7 g/dL (ref 6.5–8.1)

## 2018-02-23 LAB — CBC
HCT: 37.5 % (ref 36.0–46.0)
Hemoglobin: 12.1 g/dL (ref 12.0–15.0)
MCH: 28.3 pg (ref 26.0–34.0)
MCHC: 32.3 g/dL (ref 30.0–36.0)
MCV: 87.6 fL (ref 80.0–100.0)
Platelets: 226 10*3/uL (ref 150–400)
RBC: 4.28 MIL/uL (ref 3.87–5.11)
RDW: 14.6 % (ref 11.5–15.5)
WBC: 4.5 10*3/uL (ref 4.0–10.5)
nRBC: 0 % (ref 0.0–0.2)

## 2018-02-23 LAB — TSH: TSH: 3.934 u[IU]/mL (ref 0.350–4.500)

## 2018-02-23 LAB — PREGNANCY, URINE: Preg Test, Ur: NEGATIVE

## 2018-02-23 MED ORDER — ALBUTEROL SULFATE HFA 108 (90 BASE) MCG/ACT IN AERS
2.0000 | INHALATION_SPRAY | RESPIRATORY_TRACT | Status: DC | PRN
  Administered 2018-02-23 – 2018-02-26 (×7): 2 via RESPIRATORY_TRACT

## 2018-02-23 MED ORDER — ESCITALOPRAM OXALATE 5 MG PO TABS
5.0000 mg | ORAL_TABLET | Freq: Every day | ORAL | Status: DC
  Administered 2018-02-23 – 2018-02-25 (×3): 5 mg via ORAL
  Filled 2018-02-23 (×5): qty 1

## 2018-02-23 MED ORDER — CHLORDIAZEPOXIDE HCL 25 MG PO CAPS: 25.0000 mg | ORAL_CAPSULE | Freq: Four times a day (QID) | ORAL | Status: DC | PRN

## 2018-02-23 MED ORDER — CHLORDIAZEPOXIDE HCL 25 MG PO CAPS
50.0000 mg | ORAL_CAPSULE | Freq: Once | ORAL | Status: AC
  Administered 2018-02-23: 50 mg via ORAL
  Filled 2018-02-23: qty 2

## 2018-02-23 MED ORDER — TRAZODONE HCL 50 MG PO TABS
50.0000 mg | ORAL_TABLET | Freq: Every evening | ORAL | Status: DC | PRN
  Administered 2018-02-23 – 2018-02-26 (×4): 50 mg via ORAL
  Filled 2018-02-23 (×4): qty 1

## 2018-02-23 MED ORDER — VITAMIN B-1 100 MG PO TABS
100.0000 mg | ORAL_TABLET | Freq: Every day | ORAL | Status: DC
  Filled 2018-02-23 (×2): qty 1

## 2018-02-23 MED ORDER — ONDANSETRON 4 MG PO TBDP
4.0000 mg | ORAL_TABLET | Freq: Four times a day (QID) | ORAL | Status: DC | PRN
  Administered 2018-02-24: 4 mg via ORAL
  Filled 2018-02-23: qty 1

## 2018-02-23 MED ORDER — LOPERAMIDE HCL 2 MG PO CAPS: 2.0000 mg | ORAL_CAPSULE | ORAL | Status: DC | PRN

## 2018-02-23 MED ORDER — HYDROXYZINE HCL 25 MG PO TABS
25.0000 mg | ORAL_TABLET | Freq: Four times a day (QID) | ORAL | Status: AC | PRN
  Administered 2018-02-23 – 2018-02-26 (×5): 25 mg via ORAL
  Filled 2018-02-23 (×5): qty 1

## 2018-02-23 MED ORDER — ADULT MULTIVITAMIN W/MINERALS CH
1.0000 | ORAL_TABLET | Freq: Every day | ORAL | Status: DC
  Filled 2018-02-23 (×3): qty 1

## 2018-02-23 MED ORDER — CHLORDIAZEPOXIDE HCL 25 MG PO CAPS
25.0000 mg | ORAL_CAPSULE | Freq: Four times a day (QID) | ORAL | Status: DC | PRN
  Administered 2018-02-23: 25 mg via ORAL
  Filled 2018-02-23: qty 1

## 2018-02-23 NOTE — H&P (Signed)
Behavioral Health Medical Screening Exam  [name] is an 28 y.o. female.presenting solo, distraught, angry and upset endorsing HI with plan and passive SI, The patient cannot contract for safety. There are no acute medical concerns noted.  Total Time spent with patient: 20 minutes  Psychiatric Specialty Exam: Physical Exam  Constitutional: She is oriented to person, place, and time. She appears well-developed and well-nourished. She appears distressed.  HENT:  Head: Normocephalic.  Respiratory: Effort normal and breath sounds normal.  Neurological: She is alert and oriented to person, place, and time. No cranial nerve deficit.  Skin: Skin is warm and dry. She is not diaphoretic.  Psychiatric: Her speech is normal. Her mood appears anxious. She is withdrawn. Cognition and memory are impaired. She expresses impulsivity. She exhibits a depressed mood. She expresses homicidal and suicidal ideation. She expresses homicidal plans. She expresses no suicidal plans.    Review of Systems  Constitutional: Negative for chills, diaphoresis, fever, malaise/fatigue and weight loss.  Psychiatric/Behavioral: Positive for depression and suicidal ideas. Negative for hallucinations and substance abuse. The patient is nervous/anxious.     Blood pressure 130/68, pulse 60, temperature 98.5 F (36.9 C), temperature source Oral, resp. rate 18, height 5\' 5"  (1.651 m), weight 65.8 kg, SpO2 100 %, unknown if currently breastfeeding.Body mass index is 24.13 kg/m.  General Appearance: Disheveled  Eye Contact:  Poor  Speech:  Clear and Coherent  Volume:  Decreased  Mood:  Angry and Depressed  Affect:  Congruent  Thought Process:  Goal Directed  Orientation:  Full (Time, Place, and Person)  Thought Content:  Logical  Suicidal Thoughts:  Yes.  without intent/plan  Homicidal Thoughts:  Yes.  with intent/plan  Memory:  Immediate;   Fair  Judgement:  Poor  Insight:  Lacking  Psychomotor Activity:  Normal   Concentration: Concentration: Fair  Recall:  [place]: Fair  Akathisia:  Negative  Handed:  Right  AIMS (if indicated):     Assets:  Desire for Improvement  Sleep:       Musculoskeletal: Strength & Muscle Tone: within normal limits Gait & Station: normal Patient leans: N/A  Blood pressure 130/68, pulse 60, temperature 98.5 F (36.9 C), temperature source Oral, resp. rate 18, height 5\' 5"  (1.651 m), weight 65.8 kg, SpO2 100 %, unknown if currently breastfeeding.  Recommendations:  Based on my evaluation the patient does not appear to have an emergency medical condition.  Laverle Hobby, PA-C 02/23/2018, 1:48 AM
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
## 2018-02-23 NOTE — Tx Team (Signed)
Interdisciplinary Treatment and Diagnostic Plan Update  02/23/2018 Time of Session:  [name] MRN: [id number]  Principal Diagnosis: <principal problem not specified>  Secondary Diagnoses: Active Problems:   MDD (major depressive disorder), recurrent episode, severe (HCC)   Current Medications:  Current Facility-Administered Medications  Medication Dose Route Frequency Provider Last Rate Last Dose  . acetaminophen (TYLENOL) tablet 650 mg  650 mg Oral Q6H PRN Laverle Hobby, PA-C      . alum & mag hydroxide-simeth (MAALOX/MYLANTA) 200-200-20 MG/5ML suspension 30 mL  30 mL Oral Q4H PRN Patriciaann Clan E, PA-C      . hydrOXYzine (ATARAX/VISTARIL) tablet 50 mg  50 mg Oral Q6H PRN Patriciaann Clan E, PA-C   50 mg at 02/22/18 2319  . magnesium hydroxide (MILK OF MAGNESIA) suspension 30 mL  30 mL Oral Daily PRN Laverle Hobby, PA-C      . traZODone (DESYREL) tablet 50 mg  50 mg Oral QHS,MR X 1 Laverle Hobby, PA-C   50 mg at 02/22/18 2319   PTA Medications: Medications Prior to Admission  Medication Sig Dispense Refill Last Dose  . albuterol (PROVENTIL HFA;VENTOLIN HFA) 108 (90 Base) MCG/ACT inhaler Inhale 2 puffs into the lungs every 6 (six) hours as needed for wheezing or shortness of breath.   PRN  . ALPRAZolam (XANAX) 0.5 MG tablet Take 0.5 mg by mouth 3 (three) times daily as needed for anxiety.   07/07/2015 at Unknown time  . cyclobenzaprine (FLEXERIL) 10 MG tablet Take 1 tablet (10 mg total) by mouth 2 (two) times daily as needed for muscle spasms. 20 tablet 0   . diclofenac (VOLTAREN) 50 MG EC tablet Take 1 tablet (50 mg total) by mouth 2 (two) times daily. 15 tablet 0   . lidocaine (LIDODERM) 5 % Place 1 patch onto the skin daily. Remove & Discard patch within 12 hours or as directed by MD 30 patch 0   . sertraline (ZOLOFT) 25 MG tablet Take 25 mg by mouth daily.   07/06/2015    Patient Stressors: Marital or family conflict Substance abuse  Patient Strengths: Ability for  insight Average or above average intelligence Capable of independent living Communication skills General fund of knowledge Motivation for treatment/growth Physical Health  Treatment Modalities: Medication Management, Group therapy, Case management,  1 to 1 session with clinician, Psychoeducation, Recreational therapy.   Physician Treatment Plan for Primary Diagnosis: <principal problem not specified> Long Term Goal(s):     Short Term Goals:    Medication Management: Evaluate patient's response, side effects, and tolerance of medication regimen.  Therapeutic Interventions: 1 to 1 sessions, Unit Group sessions and Medication administration.  Evaluation of Outcomes: Not Met  Physician Treatment Plan for Secondary Diagnosis: Active Problems:   MDD (major depressive disorder), recurrent episode, severe ([place])  Long Term Goal(s):     Short Term Goals:       Medication Management: Evaluate patient's response, side effects, and tolerance of medication regimen.  Therapeutic Interventions: 1 to 1 sessions, Unit Group sessions and Medication administration.  Evaluation of Outcomes: Not Met   RN Treatment Plan for Primary Diagnosis: <principal problem not specified> Long Term Goal(s): Knowledge of disease and therapeutic regimen to maintain health will improve  Short Term Goals: Ability to participate in decision making will improve, Ability to verbalize feelings will improve, Ability to disclose and discuss suicidal ideas, Ability to identify and develop effective coping behaviors will improve and Compliance with prescribed medications will improve  Medication Management: RN will administer  medications as ordered by provider, will assess and evaluate patient's response and provide education to patient for prescribed medication. RN will report any adverse and/or side effects to prescribing provider.  Therapeutic Interventions: 1 on 1 counseling sessions, Psychoeducation, Medication  administration, Evaluate responses to treatment, Monitor vital signs and CBGs as ordered, Perform/monitor CIWA, COWS, AIMS and Fall Risk screenings as ordered, Perform wound care treatments as ordered.  Evaluation of Outcomes: Not Met   LCSW Treatment Plan for Primary Diagnosis: <principal problem not specified> Long Term Goal(s): Safe transition to appropriate next level of care at discharge, Engage patient in therapeutic group addressing interpersonal concerns.  Short Term Goals: Engage patient in aftercare planning with referrals and resources  Therapeutic Interventions: Assess for all discharge needs, 1 to 1 time with Social worker, Explore available resources and support systems, Assess for adequacy in community support network, Educate family and significant other(s) on suicide prevention, Complete Psychosocial Assessment, Interpersonal group therapy.  Evaluation of Outcomes: Not Met   Progress in Treatment: Attending groups: No. Participating in groups: No. Taking medication as prescribed: Yes. Toleration medication: Yes. Family/Significant other contact made: No, will contact:  the patient's husband, Estill Cotta  Patient understands diagnosis: Yes. Limited insight  Discussing patient identified problems/goals with staff: Yes. Medical problems stabilized or resolved: Yes. Denies suicidal/homicidal ideation: Yes. Issues/concerns per patient self-inventory: No. Other:   New problem(s) identified: None   New Short Term/Long Term Goal(s):Detox, medication stabilization, elimination of SI thoughts, development of comprehensive mental wellness plan.    Patient Goals:  I want to stop having panic attacks. I just want to be normal   Discharge Plan or Barriers: CSW will continue to follow and assess for appropriate referrals and additional discharge planning. The patient requested to be referred to an outpatient provider for medication management and therapy services.   Reason  for Continuation of Hospitalization: Anxiety Depression Medication stabilization Withdrawal symptoms  Estimated Length of Stay: 02/28/2018  Attendees: Patient: 02/23/2018 10:54 AM  Physician: Dr. Neita Garnet, MD 02/23/2018 10:54 AM  Nursing: Benjamine Mola.Jenetta Downer RN 02/23/2018 10:54 AM  RN Care Manager: Lars Pinks, RN 02/23/2018 10:54 AM  Social Worker: Radonna Ricker, [place] 02/23/2018 10:54 AM  Recreational Therapist:  02/23/2018 10:54 AM  Other: Mable Paris, NP 02/23/2018 10:54 AM  Other:  02/23/2018 10:54 AM  Other: 02/23/2018 10:54 AM    Scribe for Treatment Team: Marylee Floras, [place] 02/23/2018 10:54 AM

## 2018-02-23 NOTE — Therapy (Signed)
Occupational Therapy Group Note  Date:  02/23/2018 Time:  11:34 AM  Group Topic/Focus:  Self Esteem Action Plan:   The focus of this group is to help patients create a plan to continue to build self-esteem after discharge.  Participation Level:  Minimal  Participation Quality:  Appropriate  Affect:  Depressed and Flat  Cognitive:  Appropriate  Insight: Improving  Engagement in Group:  Limited  Modes of Intervention:  Activity, Discussion, Education and Socialization  Additional Comments:    S: "Can I just sit and watch"  O: OT tx with focus on self esteem building this date. Education given on definition of self esteem, with both causes of low and high self esteem identified. Activity given for pt to identify a positive/aspiring trait for each letter of the alphabet. Pt to work with peers to help complete activity and build positive thinking.   A: Pt presents to group with flat affect, minimally engaged this date and not accepting activities and wishing to just watch. She did contribute to brainstorming activity by stating that stereotypes are a decrease to self esteem, and self care can increase self esteem.  P: Education given on self esteem and how to improve this date. Handouts and activities given to help facilitate skills when reintegrating into community.   [name], MSOT, OTR/L Behavioral Health OT/ Acute Relief OT PHP Office: [place] 02/23/2018, 11:34 AM
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
## 2018-02-23 NOTE — Progress Notes (Signed)
Nursing Progress Note 1900-0700  Pt presented to nurses station with anxious affect and asked for her inhaler. Inhaler order was received and inhaler provided to pt. Pt complained of anxiety and stated she was sweating and irritated. No signs of perspiration noted. Pt stated she was afraid she would not be able to sleep due to her anxiety. Pt was provided PRN trazodone. Support and encouragement provided, pt receptive at times. Pt denied SI/HI/AVH and was able to contract for safety.
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
## 2018-02-23 NOTE — Plan of Care (Signed)
  Problem: Education: Goal: Emotional status will improve Outcome: Not Progressing   Problem: Activity: Goal: Interest or engagement in activities will improve Outcome: Not Progressing   Problem: Safety: Goal: Periods of time without injury will increase Outcome: Progressing  DAR NOTE: Patient presents with anxious affect and irritable mood.  Reports irritability, body aches, agitation and sweats during assessment.  Presents with period of crying spells.  Demanding to leave and at the same time requesting treatment for detox.  States she is going through Xanax withdrawal.  Rates depression at 0, hopelessness at 0, and anxiety at 10.  Maintained on routine safety checks.  Medications given as prescribed.  Support and encouragement offered as needed.  Attended group and participated.  States goal for today is "to get a diagnosis, treatment and to get the "fuck" out here."  Patient visible in milieu with minimal interaction. Patient is safe on the unit.
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
## 2018-02-24 LAB — RAPID URINE DRUG SCREEN, HOSP PERFORMED
Amphetamines: NOT DETECTED
BARBITURATES: NOT DETECTED
Benzodiazepines: POSITIVE — AB
Cocaine: NOT DETECTED
Opiates: NOT DETECTED
Tetrahydrocannabinol: POSITIVE — AB

## 2018-02-24 MED ORDER — ADULT MULTIVITAMIN W/MINERALS CH
1.0000 | ORAL_TABLET | Freq: Every day | ORAL | Status: DC
  Administered 2018-02-24 – 2018-02-25 (×2): 1 via ORAL
  Filled 2018-02-24 (×6): qty 1

## 2018-02-24 MED ORDER — CHLORDIAZEPOXIDE HCL 25 MG PO CAPS
25.0000 mg | ORAL_CAPSULE | ORAL | Status: AC
  Administered 2018-02-26 (×2): 25 mg via ORAL
  Filled 2018-02-24 (×2): qty 1

## 2018-02-24 MED ORDER — CHLORDIAZEPOXIDE HCL 25 MG PO CAPS
25.0000 mg | ORAL_CAPSULE | Freq: Three times a day (TID) | ORAL | Status: AC
  Administered 2018-02-25 (×2): 25 mg via ORAL
  Filled 2018-02-24 (×2): qty 1

## 2018-02-24 MED ORDER — POTASSIUM CHLORIDE CRYS ER 20 MEQ PO TBCR
20.0000 meq | EXTENDED_RELEASE_TABLET | Freq: Two times a day (BID) | ORAL | Status: AC
  Administered 2018-02-24 (×2): 20 meq via ORAL
  Filled 2018-02-24 (×3): qty 1

## 2018-02-24 MED ORDER — LOPERAMIDE HCL 2 MG PO CAPS
2.0000 mg | ORAL_CAPSULE | ORAL | Status: AC | PRN
  Administered 2018-02-26: 2 mg via ORAL
  Filled 2018-02-24 (×3): qty 1

## 2018-02-24 MED ORDER — ARIPIPRAZOLE 5 MG PO TABS
5.0000 mg | ORAL_TABLET | Freq: Every day | ORAL | Status: DC
  Administered 2018-02-24 – 2018-02-27 (×4): 5 mg via ORAL
  Filled 2018-02-24 (×7): qty 1

## 2018-02-24 MED ORDER — ONDANSETRON 4 MG PO TBDP
4.0000 mg | ORAL_TABLET | Freq: Four times a day (QID) | ORAL | Status: AC | PRN
  Administered 2018-02-24 – 2018-02-26 (×2): 4 mg via ORAL
  Filled 2018-02-24 (×2): qty 1

## 2018-02-24 MED ORDER — VITAMIN B-1 100 MG PO TABS
100.0000 mg | ORAL_TABLET | Freq: Every day | ORAL | Status: DC
  Administered 2018-02-25: 100 mg via ORAL
  Filled 2018-02-24 (×5): qty 1

## 2018-02-24 MED ORDER — HYDROXYZINE HCL 25 MG PO TABS: 25.0000 mg | ORAL_TABLET | Freq: Four times a day (QID) | ORAL | Status: DC | PRN

## 2018-02-24 MED ORDER — CHLORDIAZEPOXIDE HCL 25 MG PO CAPS
25.0000 mg | ORAL_CAPSULE | Freq: Four times a day (QID) | ORAL | Status: AC
  Administered 2018-02-24 (×4): 25 mg via ORAL
  Filled 2018-02-24 (×4): qty 1

## 2018-02-24 MED ORDER — CHLORDIAZEPOXIDE HCL 25 MG PO CAPS
25.0000 mg | ORAL_CAPSULE | Freq: Every day | ORAL | Status: AC
  Administered 2018-02-27: 25 mg via ORAL
  Filled 2018-02-24: qty 1

## 2018-02-24 MED ORDER — THIAMINE HCL 100 MG/ML IJ SOLN: 100.0000 mg | Freq: Once | INTRAMUSCULAR | Status: DC

## 2018-02-24 MED ORDER — CHLORDIAZEPOXIDE HCL 25 MG PO CAPS
25.0000 mg | ORAL_CAPSULE | Freq: Four times a day (QID) | ORAL | Status: AC | PRN
  Administered 2018-02-25: 25 mg via ORAL
  Filled 2018-02-24: qty 1

## 2018-02-24 NOTE — BHH Group Notes (Signed)
[facility] Mental Health Association Group Therapy      02/24/2018 2:18 PM  Type of Therapy: Mental Health Association Presentation  Participation Level: Active  Participation Quality: Attentive  Affect: Appropriate  Cognitive: Oriented  Insight: Developing/Improving  Engagement in Therapy: Engaged  Modes of Intervention: Discussion, Education and Socialization  Summary of Progress/Problems: [place] Speaker came to talk about his personal journey with mental health. The pt processed ways by which to relate to the speaker. [place] speaker provided handouts and educational information pertaining to groups and services offered by the [facility]. Pt was engaged in speaker's presentation and was receptive to resources provided.    [place] Social Worker

## 2018-02-24 NOTE — BHH Suicide Risk Assessment (Signed)
[place] INPATIENT:  Family/Significant Other Suicide Prevention Education  Suicide Prevention Education:  Education Completed; Estill Cotta, husband [phone]) has been identified by the patient as the family member/significant other with whom the patient will be residing, and identified as the person(s) who will aid the patient in the event of a mental health crisis (suicidal ideations/suicide attempt).  With written consent from the patient, the family member/significant other has been provided the following suicide prevention education, prior to the and/or following the discharge of the patient.  The suicide prevention education provided includes the following:  Suicide risk factors  Suicide prevention and interventions  National Suicide Hotline telephone number  [facility] assessment telephone number  [facility] Emergency Assistance [place] and/or Residential Mobile Crisis Unit telephone number  Request made of family/significant other to:  Remove weapons (e.g., guns, rifles, knives), all items previously/currently identified as safety concern.    Remove drugs/medications (over-the-counter, prescriptions, illicit drugs), all items previously/currently identified as a safety concern.  The family member/significant other verbalizes understanding of the suicide prevention education information provided.  The family member/significant other agrees to remove the items of safety concern listed above.  The patient's husband reports that the patient's "child-like" behaviors are not her baseline. He reports that the patient has attempted to "clean herself" of Xanax for the last month and that he has noticed major changes in her affect and behaviors at home. He states that the patient has become very isolative, labile and restless. CSW asked the patient's husband about the patient's homicidal ideation towards her son's father. According to the husband, the patient  and her child's father have always had a strained relationship and that the patient only engages with her son's father when they alternate custody for visitation with the patient. CSW notified MD and will continue to follow.   Marylee Floras 02/24/2018, 11:24 AM
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
## 2018-02-24 NOTE — Progress Notes (Signed)
Nutrition Education Note  Pt attended group focusing on general, healthful nutrition education.  RD emphasized the importance of eating regular meals and snacks throughout the day. Consuming sugar-free beverages and incorporating fruits and vegetables into diet when possible. Provided examples of healthy snacks. Patient encouraged to leave group with a goal to improve nutrition/healthy eating.   Diet Order:  Diet Order           Diet regular Room service appropriate? Yes; Fluid consistency: Thin  Diet effective now         Pt is also offered choice of unit snacks mid-morning and mid-afternoon.  Pt is eating as desired.   If additional nutrition issues arise, please consult RD.    Julieann Drummonds, MS, RD, LDN, CNSC Inpatient Clinical Dietitian Pager # [phone] After hours/weekend pager # [phone]    

## 2018-02-24 NOTE — Plan of Care (Signed)
  Problem: Activity: Goal: Interest or engagement in activities will improve Outcome: Progressing   Problem: Safety: Goal: Periods of time without injury will increase Outcome: Progressing  DAR NOTE: Patient presents with anxious affect and mood.  Irritable at times with period of crying spells.  Complain of nausea and vomiting episodes.  Zofran 4 mg given with good effect.  Denies suicidal thoughts and visual hallucination.  Rates depression at 5, hopelessness at 5, and anxiety at 5.  Maintained on routine safety checks.  Medications given as prescribed.  Support and encouragement offered as needed.  Attended group and participated.  States goal for today is "getting better and less anxious."  Patient visible in milieu with minimal interaction with staff and peers.  Patient remained safe on the unit.

## 2018-02-24 NOTE — Progress Notes (Signed)
D: Pt was in hallway upon initial approach.  Pt presents with anxious affect and mood.  She describes her day as "better compared to yesterday."  Her goal tonight is to "get some ice cream, go to bed."  She discusses how she would like to be discharged.  Pt denies SI/HI, denies hallucinations, denies pain.  Pt has been visible in milieu interacting with peers and staff appropriately.  Pt attended evening group.    A: Introduced self to pt.  Met with pt 1:1.  Actively listened to pt and offered support and encouragement.  Medication administered per order.  PRN medication administered for anxiety, sleep, and shortness of breath.  72 hour AMA discharge request was reviewed with pt and she verbalized understanding.   Pt completed request form.  Q15 minute safety checks maintained.  R: Pt is safe on the unit.  Pt is compliant with medications.  Pt verbally contracts for safety.  Will continue to monitor and assess.

## 2018-02-24 NOTE — [facility] Group Notes (Signed)
Adult Psychoeducational Group Note  Date:  02/24/2018 Time:  9:37 PM  Group Topic/Focus:  Wrap-Up Group:   The focus of this group is to help patients review their daily goal of treatment and discuss progress on daily workbooks.  Participation Level:  Active  Participation Quality:  Appropriate and Attentive  Affect:  Appropriate  Cognitive:  Alert and Appropriate  Insight: Appropriate and Good  Engagement in Group:  Engaged  Modes of Intervention:  Discussion and Education  Additional Comments:  Pt attended and participated in wrap up group this evening. Pt rated their day an 8/10, due to them having "more clarity" but they did feel some nausea today. Pt could not remember their goal for today, but tomorrow they want to regain clarity (have a clearer mind). Pt states that they had a lot of thoughts in their head that were not there.     Cristi Loron 02/24/2018, 9:37 PM

## 2018-02-25 LAB — BASIC METABOLIC PANEL
Anion gap: 9 (ref 5–15)
BUN: 10 mg/dL (ref 6–20)
CHLORIDE: 107 mmol/L (ref 98–111)
CO2: 22 mmol/L (ref 22–32)
Calcium: 9.1 mg/dL (ref 8.9–10.3)
Creatinine, Ser: 0.77 mg/dL (ref 0.44–1.00)
GFR calc Af Amer: >60 mL/min (ref >60)
Glucose, Bld: 99 mg/dL (ref 70–99)
POTASSIUM: 3.5 mmol/L (ref 3.5–5.1)
Sodium: 138 mmol/L (ref 135–145)

## 2018-02-25 MED ORDER — ESCITALOPRAM OXALATE 10 MG PO TABS
10.0000 mg | ORAL_TABLET | Freq: Every day | ORAL | Status: DC
  Administered 2018-02-26 – 2018-02-27 (×2): 10 mg via ORAL
  Filled 2018-02-25 (×4): qty 1

## 2018-02-25 NOTE — [facility] Group Notes (Signed)
LCSW Group Therapy Note  02/25/2018 1:15pm  Type of Therapy and Topic:  Group Therapy: Avoiding Self-Sabotaging and Enabling Behaviors  Participation Level:  Did Not Attend   Description of Group:   In this group, patients will learn how to identify obstacles, self-sabotaging and enabling behaviors, as well as: what are they, why do we do them and what needs these behaviors meet. Discuss unhealthy relationships and how to have positive healthy boundaries with those that sabotage and enable. Explore aspects of self-sabotage and enabling in yourself and how to limit these self-destructive behaviors in everyday life.   Therapeutic Goals: 1. Patient will identify one obstacle that relates to self-sabotage and enabling behaviors 2. Patient will identify one personal self-sabotaging or enabling behavior they did prior to admission 3. Patient will state a plan to change the above identified behavior 4. Patient will demonstrate ability to communicate their needs through discussion and/or role play.    Lawana Pai, MSW [facility] 02/25/2018 2:32 PM

## 2018-02-25 NOTE — Progress Notes (Signed)
D Patient has been observed OOB UAL on the 400 hall today- she tolerates this fairly well. Her affect is flat, anxious and she is so soft spoken ( like a child almost) it is frequently difficult to understand her wors.     A She takes her scheduled meds as planned. She completed her daily assessment and on this she wrote she deneid SI today and she rated her depression, hopelessness and anxiety " 0/0/5", respectively. She has been given prn Librium ( for c/o "panic attack" this morning, went to sleep thereafter and did not receive 1200 scheduled dose of librium. Dr. Parke Poisson was notified by this writer.SHe is focused on her physical state- not able to process was she can learn to do to hlep her deal with her high anxiety level.      R Safety is in place.

## 2018-02-25 NOTE — Progress Notes (Signed)
D: Pt was in hallway upon initial approach.  Pt presents with anxious affect and mood.  She describes her day as "good."  She states "I did want to leave but I guess I'm still withdrawing."  Pt reports she went to the cafeteria today and it went well.  Her goal is to "make sure I sleep."  Pt denies SI/HI, denies hallucinations, denies pain.  Pt has been visible in milieu interacting with peers and staff appropriately.  Pt attended evening group.    A: Introduced self to pt.  Actively listened to pt and offered support and encouragement.  PRN medication administered for shortness of breath, anxiety, and sleep.  Q15 minute safety checks maintained.  R: Pt is safe on the unit.  Pt is compliant with medications.  Pt verbally contracts for safety.  Will continue to monitor and assess.

## 2018-02-25 NOTE — Progress Notes (Signed)
Recreation Therapy Notes  Date:  1.24.20 Time: 0930 Location: 300 Hall Dayroom  Group Topic: Stress Management  Goal Area(s) Addresses:  Patient will identify positive stress management techniques. Patient will identify benefits of using stress management post d/c.  Behavioral Response: Engaged  Intervention: Stress Management  Activity :  Meditation.  LRT played a meditation that dealt with having choice.  Patients were to listen to the meditation to follow along and engage.  Education:  Stress Management, Discharge Planning.   Education Outcome: Acknowledges Education  Clinical Observations/Feedback:  Pt attended and participated in group.    Victorino Sparrow, LRT/CTRS         Victorino Sparrow A 02/25/2018 10:55 AM
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
## 2018-02-26 NOTE — BHH Group Notes (Signed)
LCSW Group Therapy Note  02/26/2018   10:00-11:00am   Type of Therapy and Topic:  Group Therapy: Anger Cues and Responses  Participation Level:  Active   Description of Group:   In this group, patients learned how to recognize the physical, cognitive, emotional, and behavioral responses they have to anger-provoking situations.  They identified a recent time they became angry and how they reacted.  They analyzed how their reaction was possibly beneficial and how it was possibly unhelpful.  The group discussed a variety of healthier coping skills that could help with such a situation in the future.  Deep breathing was practiced briefly.  Therapeutic Goals: 1. Patients will remember their last incident of anger and how they felt emotionally and physically, what their thoughts were at the time, and how they behaved. 2. Patients will identify how their behavior at that time worked for them, as well as how it worked against them. 3. Patients will explore possible new behaviors to use in future anger situations. 4. Patients will learn that anger itself is normal and cannot be eliminated, and that healthier reactions can assist with resolving conflict rather than worsening situations.  Summary of Patient Progress:  Patient was present and engaged in group. Patient shared the last time she was angry was a couple of days ago when she wanted to leave the gym but was not able to. Patient reports she was cold and wanted to leave so she threw a chair but not hard and not at anyone. Patient was able to identify feelings of shock, frustration and irritation lead to anger for her. Patient reports her anger warning signs are when her mind goes blank. Patient reports she will loose control. Patient reports she plans to be consistent with medications and therapy as well as try to avoid arguments. Patient was a bit restless towards the end of group with the way she was sitting changing several times with her head down.    Therapeutic Modalities:   Cognitive Behavioral Therapy  Tye Savoy

## 2018-02-26 NOTE — B[facility] Group Notes (Signed)
Adult Psychoeducational Group Note  Date:  02/26/2018 Time:  9:59 PM  Group Topic/Focus:  Wrap-Up Group:   The focus of this group is to help patients review their daily goal of treatment and discuss progress on daily workbooks.  Participation Level:  Active  Participation Quality:  Appropriate and Attentive  Affect:  Appropriate  Cognitive:  Alert and Appropriate  Insight: Appropriate and Good  Engagement in Group:  Engaged  Modes of Intervention:  Discussion and Education  Additional Comments:  Pt attended and participated in wrap up group this evening. Pt rated their day a 10/10, due to them not letting their situation (detoxing) get the best of them. Pt completed their goal, which was to not wake up in a panic.   Cristi Loron 02/26/2018, 9:59 PM

## 2018-02-26 NOTE — Progress Notes (Signed)
D Patient presents to the nurses' station this morning at change of shift. She is anxious. She speaks in a quiet whisper-like child-like voice. She wears her own clothes, which it appears she has slept in last night. She says she is " still having trouble with this nausea.." Night shift RN reported patient complained of vomiting at 0200 and RN stated she medicated pt then with prn zofran but stated RN did not actually see the patient's vomitus, although RN stated she had asked pt to save the vomitus for RN to see and pt did not. Patient stated " I don't know why I'm having so much trouble with my stomach".Pt's BP at 0800 116/74 and HR was 84.     R Pt took am meds as planned and refused both multi vitamin and vitamin B, This was communicated to Dr Parke Poisson by this Probation officer. Pt completed her daily assessment and on this she wrote she deneid having SI today and she rated her depression, hopelessness and axneity as "0/0/1", respectively. She is encouraged to attend her groups today, by this Probation officer and she is working on [place] a goal for the day.     R Safety is in place.
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
## 2018-02-26 NOTE — Plan of Care (Signed)
  Problem: Education: Goal: Verbalization of understanding the information provided will improve Outcome: Progressing   Problem: Activity: Goal: Interest or engagement in activities will improve Outcome: Progressing   

## 2018-02-27 DIAGNOSIS — F410 Panic disorder [episodic paroxysmal anxiety] without agoraphobia: Secondary | ICD-10-CM

## 2018-02-27 DIAGNOSIS — F1320 Sedative, hypnotic or anxiolytic dependence, uncomplicated: Secondary | ICD-10-CM

## 2018-02-27 MED ORDER — HYDROXYZINE HCL 25 MG PO TABS
25.0000 mg | ORAL_TABLET | Freq: Once | ORAL | Status: AC
  Administered 2018-02-27: 25 mg via ORAL
  Filled 2018-02-27 (×2): qty 1

## 2018-02-27 MED ORDER — ALBUTEROL SULFATE HFA 108 (90 BASE) MCG/ACT IN AERS: 2.0000 | INHALATION_SPRAY | RESPIRATORY_TRACT | 0 refills | Status: DC | PRN

## 2018-02-27 MED ORDER — TRAZODONE HCL 50 MG PO TABS: 50.0000 mg | ORAL_TABLET | Freq: Every evening | ORAL | 0 refills | Status: DC | PRN

## 2018-02-27 MED ORDER — ARIPIPRAZOLE 5 MG PO TABS: 5.0000 mg | ORAL_TABLET | Freq: Every day | ORAL | 0 refills | Status: DC

## 2018-02-27 MED ORDER — ESCITALOPRAM OXALATE 10 MG PO TABS: 10.0000 mg | ORAL_TABLET | Freq: Every day | ORAL | 0 refills | Status: DC

## 2018-02-27 NOTE — Progress Notes (Signed)
Patient is UAL on the 400 hall this am. She says she is ready for dc'd...that she's learned " what I need to know to be ok". SHe completed her daily assessment and on this she wrote she denied SI today and she rated her depression, hopelessness and anxiety " 0/0/2", respectively. Her dc instrucitons are reviewed with her by this Probation officer and she is given cc of all instrucitons ( SRA, AVS, SSP and transition record). All belongings from her locker are returned to her and then she is escorted to  [employer] and dc'd ambulatory per MD order.

## 2018-02-27 NOTE — Progress Notes (Signed)
DAR: Pt observed seated on the hallway with peer during the beginning of the shift. Pt complained of not sleeping well last night and had to woke up early which is unusual for her. Pt stated she a had a good day, attended  wrap up group.  Denies pain, auditory and visual hallucinations.  Maintained on routine safety checks.  Medications given as prescribed.  Support and encouragement offered as needed, will continue to monitor.

## 2018-02-27 NOTE — BHH Group Notes (Signed)
[facility] LCSW Group Therapy Note  Date/Time:  02/27/2018 9:00-10:00 or 10:00-11:00AM  Type of Therapy and Topic:  Group Therapy:  Healthy and Unhealthy Supports  Participation Level:  Active   Description of Group:  Patients in this group were introduced to the idea of adding a variety of healthy supports to address the various needs in their lives.Patients discussed what additional healthy supports could be helpful in their recovery and wellness after discharge in order to prevent future hospitalizations.   An emphasis was placed on using counselor, doctor, therapy groups, 12-step groups, and problem-specific support groups to expand supports.  They also worked as a group on developing a specific plan for several patients to deal with unhealthy supports through [place], psychoeducation with loved ones, and even termination of relationships.   Therapeutic Goals:   1)  discuss importance of adding supports to stay well once out of the hospital  2)  compare healthy versus unhealthy supports and identify some examples of each  3)  generate ideas and descriptions of healthy supports that can be added  4)  offer mutual support about how to address unhealthy supports  5)  encourage active participation in and adherence to discharge plan    Summary of Patient Progress:  The patient stated that current healthy supports in her life include her husband while current unhealthy supports include her mother and sisters who she says "have too much drama".  The patient expressed a willingness to add therapy and return to the gym as supports to help in her recovery journey.   Therapeutic Modalities:   Motivational Interviewing Brief Solution-Focused Therapy  Rolanda Jay

## 2018-02-27 NOTE — Progress Notes (Addendum)
  [facility] Adult Case Management Discharge Plan :  Will you be returning to the same living situation after discharge:  Yes,  home At discharge, do you have transportation home?: Yes,  husband Do you have the ability to pay for your medications: Yes,  [facility]  Release of information consent forms completed and submitted to medical records by CSW.   Patient to Follow up at: Follow-up Information    [facility]. Go on 03/03/2018.   Specialty:  Behavioral Health Why:  Your hospital follow up appointment is 1/30 at 8:30a. Please bring your photo ID, proof of insurance, SSN, current medications, and discharge paperwork from this hospitalization.  Contact information: [place] [address] [phone]        [facility] Follow up.   Specialty:  Behavioral Health Why:  If you are interested in being assessed for treatment at this agency, please call the office Monday, 1/27 to schedule appointment. Thank you.  Contact information: [place] [address] [phone]           Next level of care provider has access to [place] and Suicide Prevention discussed: Yes,  SPE completed with pt and her husband. SPI pamphlet and mobile crisis information provided to pt.   Have you used any form of tobacco in the last 30 days? (Cigarettes, Smokeless Tobacco, Cigars, and/or Pipes): No  Has patient been referred to the Quitline?: N/A patient is not a smoker  Patient has been referred for addiction treatment: [place], LCSW 02/27/2018, 11:39 AM

## 2018-02-27 NOTE — BHH Suicide Risk Assessment (Addendum)
[facility] Discharge Suicide Risk Assessment   Principal Problem:  MDD Discharge Diagnoses: Active Problems:   MDD (major depressive disorder), recurrent episode, severe ([place])   Total Time spent with patient: 30 minutes  Musculoskeletal: Strength & Muscle Tone: within normal limits Gait & Station: normal Patient leans: N/A  Psychiatric Specialty Exam: ROS no headache, no chest pain, no shortness of breath, no vomiting , no fever  Blood pressure 110/69, pulse 76, temperature 98.2 F (36.8 C), temperature source Oral, resp. rate 16, height 5\' 5"  (1.651 m), weight 65.8 kg, SpO2 100 %, unknown if currently breastfeeding.Body mass index is 24.13 kg/m.  General Appearance: Well Groomed  Engineer, water::  Good  Speech:  Normal Rate409  Volume:  Normal  Mood:  improving mood , presents euthymic  Affect:  Appropriate and Full Range  Thought Process:  Linear and Descriptions of Associations: Intact  Orientation:  Full (Time, Place, and Person)  Thought Content:  no hallucinations, no delusions,not internally preoccupied   Suicidal Thoughts:  No  Homicidal Thoughts:  No  Memory:  recent and remote grossly intact   Judgement:  Other:  improved   Insight:  improved   Psychomotor Activity:  Normal  Concentration:  Good  Recall:  Good  Fund of Knowledge:Good  Language: Good  Akathisia:  Negative  Handed:  Right  AIMS (if indicated):   No current abnormal or involuntary movements noted or reported  Assets:  Communication Skills Desire for Improvement Resilience Social Support  Sleep:  Number of Hours: 5.25  Cognition: WNL  ADL's:  Intact   Mental Status Per Nursing Assessment::   On Admission:  Suicidal ideation indicated by patient, Self-harm thoughts  Demographic Factors:  65, married, 3 children, employed  Loss Factors: History of anxiety disorder, benzodiazepine use, had attempted to taper off benzodiazepines prior to admission leading to increased anxiety/symptoms  Historical  Factors: No prior psychiatric admissions, no prior suicide attempts, history of depression, history of anxiety, history of panic attacks, history of long-term benzodiazepine use  Risk Reduction Factors:   Responsible for children under 17 years of age, Sense of responsibility to family, Living with another person, especially a relative, Positive social support and Positive coping skills or problem solving skills  Continued Clinical Symptoms:  At this time patient presents alert, attentive, well-groomed, good eye contact, mood much improved and currently euthymic, affect more reactive, not anxious at this time.  No thought disorder, no suicidal or homicidal ideations, no hallucinations, no delusions expressed, future oriented. Behavior on unit calm and in good control, pleasant on approach. No current symptoms of benzodiazepine withdrawal-no headache, no visual disturbances, no tremors, no diaphoresis, vitals stable. Currently denies medication side effects.-Side effects reviewed, including risk of metabolic and motor side effects associated with Abilify. With her expressed consent I spoke via phone with her husband who corroborates that patient seems much improved/at baseline  Cognitive Features That Contribute To Risk:  No gross cognitive deficits noted upon discharge. Is alert , attentive, and oriented x 3   Suicide Risk:  Mild:  Suicidal ideation of limited frequency, intensity, duration, and specificity.  There are no identifiable plans, no associated intent, mild dysphoria and related symptoms, good self-control (both objective and subjective assessment), few other risk factors, and identifiable protective factors, including available and accessible social support.  Follow-up Information    [facility]. Go on 03/03/2018.   Specialty:  Behavioral Health Why:  Your hospital follow up appointment is 1/30 at 8:30a. Please bring your photo ID, proof of  insurance, SSN, current medications, and  discharge paperwork from this hospitalization.  Contact information: [place] [address] [phone]           Plan Of Care/Follow-up recommendations:  Activity:  as tolerated  Diet:  regular Tests:  NA Other:  See below  Patient is leaving unit in good spirits, plans to return home Follow-up as above. Jenne Campus, MD 02/27/2018, 9:46 AM
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
## 2018-03-04 ENCOUNTER — Encounter (HOSPITAL_COMMUNITY): Payer: Self-pay | Admitting: Psychiatry

## 2018-03-04 ENCOUNTER — Ambulatory Visit (INDEPENDENT_AMBULATORY_CARE_PROVIDER_SITE_OTHER): Payer: Medicaid Other | Admitting: Psychiatry

## 2018-03-04 VITALS — BP 124/81 | HR 76 | Ht 65.0 in | Wt 144.0 lb

## 2018-03-04 DIAGNOSIS — F4310 Post-traumatic stress disorder, unspecified: Secondary | ICD-10-CM | POA: Diagnosis not present

## 2018-03-04 DIAGNOSIS — F410 Panic disorder [episodic paroxysmal anxiety] without agoraphobia: Secondary | ICD-10-CM

## 2018-03-04 DIAGNOSIS — F1320 Sedative, hypnotic or anxiolytic dependence, uncomplicated: Secondary | ICD-10-CM

## 2018-03-04 MED ORDER — PROPRANOLOL HCL 10 MG PO TABS: 10.0000 mg | ORAL_TABLET | Freq: Two times a day (BID) | ORAL | 0 refills | Status: DC

## 2018-03-04 MED ORDER — TRAZODONE HCL 100 MG PO TABS: 100.0000 mg | ORAL_TABLET | Freq: Every evening | ORAL | 0 refills | Status: DC | PRN

## 2018-03-04 MED ORDER — SERTRALINE HCL 50 MG PO TABS: 50.0000 mg | ORAL_TABLET | Freq: Every day | ORAL | 0 refills | Status: DC

## 2018-03-04 MED ORDER — CHLORDIAZEPOXIDE HCL 5 MG PO CAPS: 5.0000 mg | ORAL_CAPSULE | Freq: Three times a day (TID) | ORAL | 0 refills | Status: DC | PRN

## 2018-03-04 NOTE — Patient Instructions (Signed)
Plan:  1. You stopped Abilify and Lexapro - please start sertraline (Zoloft) instead - 1/2 tablet for 4 days than start taking a whole 50 mg tablet (with food).  2. Increase dose of trazodone at night to 100 mg.  3. Start propranolol (for flashbacks, nightmares, anxiety) one 10 mg tablet twice daily.  4. Librium three times daily as needed for panic anxiety for the time being.
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
## 2018-03-07 ENCOUNTER — Other Ambulatory Visit (HOSPITAL_COMMUNITY): Payer: Self-pay

## 2018-03-07 MED ORDER — CHLORDIAZEPOXIDE HCL 5 MG PO CAPS: 5.0000 mg | ORAL_CAPSULE | Freq: Three times a day (TID) | ORAL | 0 refills | Status: DC | PRN

## 2018-03-14 ENCOUNTER — Ambulatory Visit (INDEPENDENT_AMBULATORY_CARE_PROVIDER_SITE_OTHER): Payer: Medicaid Other | Admitting: Psychiatry

## 2018-03-14 ENCOUNTER — Encounter (HOSPITAL_COMMUNITY): Payer: Self-pay | Admitting: Psychiatry

## 2018-03-14 VITALS — BP 122/82 | HR 82 | Ht 65.5 in | Wt 141.0 lb

## 2018-03-14 DIAGNOSIS — F4310 Post-traumatic stress disorder, unspecified: Secondary | ICD-10-CM | POA: Insufficient documentation

## 2018-03-14 DIAGNOSIS — F410 Panic disorder [episodic paroxysmal anxiety] without agoraphobia: Secondary | ICD-10-CM | POA: Diagnosis not present

## 2018-03-14 MED ORDER — PROPRANOLOL HCL 20 MG PO TABS: 20.0000 mg | ORAL_TABLET | Freq: Two times a day (BID) | ORAL | 0 refills | Status: DC

## 2018-03-14 MED ORDER — TRAZODONE HCL 150 MG PO TABS: 150.0000 mg | ORAL_TABLET | Freq: Every evening | ORAL | 0 refills | Status: DC | PRN

## 2018-03-14 NOTE — Progress Notes (Signed)
BH MD/PA/NP OP Progress Note  03/14/2018 8:20 AM [name]  MRN:  [id number]  Chief Complaint: Anxiety/panic attacks HPI: 28 yo female with PTSD/panic disorder who has recently been hospitalized with symptoms of benzodiazepine withdrawal. She has been started on escitalopram which she could not tolerate then switched to sertraline which she had been on in the past with good response. Propranolol and low dose of Librium have been added for anxiety and trazodone for insomnia. She still wakes up early and does have nightmares but overall anxiety has declined. Visit Diagnosis:    ICD-10-CM   1. Panic disorder F41.0   2. PTSD (post-traumatic stress disorder) F43.10     Past Psychiatric History: Please refer to original H&P, no updates requires.  Past Medical History:  Past Medical History:  Diagnosis Date  . Anemia 2010  . Anxiety   . Asthma   . Depression    h/o pp depression after 1st pregnancy  . GERD (gastroesophageal reflux disease)     Past Surgical History:  Procedure Laterality Date  . CESAREAN SECTION  11/01/2008  . CESAREAN SECTION N/A 06/29/2012   Procedure: CESAREAN SECTION;  Surgeon: Melina Schools, MD;  Location: [facility];  Service: Obstetrics;  Laterality: N/A;  1 1/2 hrs OR time   . CESAREAN SECTION N/A 12/18/2014   Procedure: REPEAT CESAREAN SECTION;  Surgeon: Frederico Hamman, MD;  Location: [facility];  Service: Obstetrics;  Laterality: N/A;  . WISDOM TOOTH EXTRACTION      Family Psychiatric History: Reviewed  Family History:  Family History  Problem Relation Age of Onset  . Asthma Mother   . Arthritis Mother   . Diabetes Mother   . Hypertension Mother   . Allergies Mother   . Hypertension Father   . Asthma Sister   . Allergies Son   . Asthma Son   . Other Neg Hx     Social History:  Social History   Socioeconomic History  . Marital status: Married    Spouse name: Not on file  . Number of children: 3  . Years of education: Not on file  . Highest  education level: Bachelor's degree (e.g., BA, AB, BS)  Occupational History  . Not on file  Social Needs  . Financial resource strain: Not hard at all  . Food insecurity:    Worry: Never true    Inability: Never true  . Transportation needs:    Medical: No    Non-medical: No  Tobacco Use  . Smoking status: Former Smoker    Packs/day: 0.25    Years: 2.00    Pack years: 0.50    Types: Cigarettes    Last attempt to quit: 04/14/2014    Years since quitting: 3.9  . Smokeless tobacco: Never Used  Substance and Sexual Activity  . Alcohol use: Yes    Alcohol/week: 2.0 standard drinks    Types: 2 Standard drinks or equivalent per week    Comment: occasionally  . Drug use: Yes    Types: Marijuana    Comment: last use March 2016  . Sexual activity: Yes    Birth control/protection: None    Comment: pt will consider using condoms  Lifestyle  . Physical activity:    Days per week: 0 days    Minutes per session: 0 min  . Stress: Very much  Relationships  . Social connections:    Talks on phone: Not on file    Gets together: Not on file  Attends religious service: Never    Active member of club or organization: No    Attends meetings of clubs or organizations: Never    Relationship status: Married  Other Topics Concern  . Not on file  Social History Narrative  . Not on file    Allergies: No Known Allergies  Metabolic Disorder Labs: Lab Results  Component Value Date   HGBA1C 5.0% 12/01/2012   No results found for: PROLACTIN No results found for: CHOL, TRIG, HDL, CHOLHDL, VLDL, LDLCALC Lab Results  Component Value Date   TSH 3.934 02/23/2018   TSH 0.760 12/01/2012    Therapeutic Level Labs: No results found for: LITHIUM No results found for: VALPROATE No components found for:  CBMZ  Current Medications: Current Outpatient Medications  Medication Sig Dispense Refill  . albuterol (PROVENTIL HFA;VENTOLIN HFA) 108 (90 Base) MCG/ACT inhaler Inhale 2 puffs into the  lungs every 4 (four) hours as needed for wheezing or shortness of breath. 1 Inhaler 0  . chlordiazePOXIDE (LIBRIUM) 5 MG capsule Take 1 capsule (5 mg total) by mouth 3 (three) times daily as needed for anxiety. 90 capsule 0  . hydrOXYzine (VISTARIL) 25 MG capsule Take 25 mg by mouth 2 (two) times daily.    . propranolol (INDERAL) 10 MG tablet Take 1 tablet (10 mg total) by mouth 2 (two) times daily for 30 days. 60 tablet 0  . sertraline (ZOLOFT) 50 MG tablet Take 1 tablet (50 mg total) by mouth daily for 30 days. 30 tablet 0  . traZODone (DESYREL) 100 MG tablet Take 1 tablet (100 mg total) by mouth at bedtime as needed for up to 30 days for sleep. 30 tablet 0   No current facility-administered medications for this visit.      Musculoskeletal: Strength & Muscle Tone: within normal limits Gait & Station: normal Patient leans: N/A  Psychiatric Specialty Exam: Review of Systems  Constitutional: Negative.   HENT: Negative.   Eyes: Negative.   Respiratory: Negative.   Cardiovascular: Positive for palpitations.  Gastrointestinal: Negative.   Genitourinary: Negative.   Musculoskeletal: Negative.   Skin: Negative.   Neurological: Negative.   Endo/Heme/Allergies: Negative.   Psychiatric/Behavioral: The patient is nervous/anxious.     unknown if currently breastfeeding.There is no height or weight on file to calculate BMI.  General Appearance: Casual and Well Groomed  Eye Contact:  Good  Speech:  Clear and Coherent  Volume:  Normal  Mood:  Anxious  Affect:  Full Range  Thought Process:  Goal Directed  Orientation:  Full (Time, Place, and Person)  Thought Content: Illogical   Suicidal Thoughts:  No  Homicidal Thoughts:  No  Memory:  Immediate;   Good Recent;   Good Remote;   Good  Judgement:  Fair  Insight:  Fair  Psychomotor Activity:  Normal  Concentration:  Concentration: Good and Attention Span: Good  Recall:  Good  Fund of Knowledge: Good  Language: Good  Akathisia:   Negative  Handed:  Right  AIMS (if indicated): not done  Assets:  Communication Skills Desire for Improvement Physical Health  ADL's:  Intact  Cognition: WNL  Sleep:  Fair   Screenings: AIMS     Admission (Discharged) from OP Visit from 02/22/2018 in [facility]  AIMS Total Score  0    AUDIT     Admission (Discharged) from OP Visit from 02/22/2018 in [facility]  Alcohol Use Disorder Identification Test Final Score (AUDIT)  1  Assessment and Plan: 28 yo female with chronic PTSD/panic disorder and some depression. She seems to be responding to a combination of sertraline, trazodone, propranolol plus Librium prn anxiety. Still reports problems with occasional panic sx, late insomnia, nightmares. She reports vague SI after starting Zoloft w/o plan or intent and attributed this to the effect of medication. Affect brighter, calmer, satisfied with her own progress. She will start counseling next week. On FMLE plans to return to work on March 2nd.  Dx: PTSD, Panic disorder  Plan: Continue Zoloft unchanged at thsi time. Increase doses of trazodone to 150 mg (for sleep) and propranolol to 20 mg bid (for anxiety,nightmares). Continue Librium 5 mg prn anxiety. Return to clinic in one month - we will entertain an option to increase Zoloft to 100 mg at that time.    Stephanie Acre, MD 03/14/2018, 8:20 AM

## 2018-03-14 NOTE — Patient Instructions (Signed)
Plan:  1. We will try 150 mg of trazodone for sleep.  2. Try 1 tablet of Librium daily for anxiety and two at bedtime.  3. We are doubling dose of propranolol while keeping Zoloft at 50 mg.

## 2018-03-16 ENCOUNTER — Telehealth (HOSPITAL_COMMUNITY): Payer: Self-pay

## 2018-03-16 NOTE — Telephone Encounter (Signed)
Patient is calling because she took the Zoloft on Monday night and woke up on Tuesday throwing up and nauseous, patient is still not feeling well, she has not taken any more Zoloft - she said she took the whole pill instead of half - please review and advise, thank you

## 2018-03-16 NOTE — Telephone Encounter (Signed)
I am confused about this story. I have seen this patient on January 31st and she was then started on 25 mg of Zoloft (which she reported tolerating well at 50 mg in the past). She was supposed to take 1/2 tablet for 4 days and then increase it to a whole 50 mg tablet daily with food (not at night). When she came back this Monday she told me that she is taking  50 mg and is tolerating it well? That is she was not experiencing nausea, vomiting etc. If she was still rather taking 1/2 tablet I would recommend she continues to take 1/2 tablet but increase frequency to twice daily (still with food): in AM with breakfast and  In late PM with dinner.  OP

## 2018-03-21 NOTE — Telephone Encounter (Signed)
I called patient back to confirm the message, she states that on Monday night she increased the Zoloft from a half a tablet to a whole tablet and woke up the next morning sick. However the symptoms have not gone away and patient is still not feeling well, she is going to her PCP today. I advised patient that it was probably not the increase of Zoloft that made her sick since she has not taken it in 5 days and she is still sick. I asked patient to call me and let me know how it goes at the doctors and when she is feeling a little better she can restart the Zoloft at a 1/2 tab. Patient was agreeable to this plan and will call and keep updated.

## 2018-03-22 ENCOUNTER — Encounter (HOSPITAL_COMMUNITY): Payer: Self-pay | Admitting: Emergency Medicine

## 2018-03-22 ENCOUNTER — Emergency Department (HOSPITAL_COMMUNITY)
Admission: EM | Admit: 2018-03-22 | Discharge: 2018-03-22 | Disposition: A | Discharge status: Home / Self Care | Payer: Medicaid Other | Attending: Emergency Medicine | Admitting: Emergency Medicine

## 2018-03-22 ENCOUNTER — Ambulatory Visit (HOSPITAL_COMMUNITY): Payer: Medicaid Other | Admitting: Licensed Clinical Social Worker

## 2018-03-22 DIAGNOSIS — J45909 Unspecified asthma, uncomplicated: Secondary | ICD-10-CM | POA: Insufficient documentation

## 2018-03-22 DIAGNOSIS — R1110 Vomiting, unspecified: Secondary | ICD-10-CM | POA: Insufficient documentation

## 2018-03-22 DIAGNOSIS — F1210 Cannabis abuse, uncomplicated: Secondary | ICD-10-CM | POA: Insufficient documentation

## 2018-03-22 DIAGNOSIS — Z79899 Other long term (current) drug therapy: Secondary | ICD-10-CM | POA: Insufficient documentation

## 2018-03-22 DIAGNOSIS — R197 Diarrhea, unspecified: Secondary | ICD-10-CM | POA: Diagnosis not present

## 2018-03-22 DIAGNOSIS — Z87891 Personal history of nicotine dependence: Secondary | ICD-10-CM | POA: Insufficient documentation

## 2018-03-22 DIAGNOSIS — R109 Unspecified abdominal pain: Secondary | ICD-10-CM | POA: Diagnosis present

## 2018-03-22 LAB — COMPREHENSIVE METABOLIC PANEL
ALT: 26 U/L (ref 0–44)
AST: 21 U/L (ref 15–41)
Albumin: 4.8 g/dL (ref 3.5–5.0)
Alkaline Phosphatase: 36 U/L — ABNORMAL LOW (ref 38–126)
Anion gap: 7 (ref 5–15)
BUN: 13 mg/dL (ref 6–20)
CO2: 24 mmol/L (ref 22–32)
Calcium: 9 mg/dL (ref 8.9–10.3)
Chloride: 109 mmol/L (ref 98–111)
Creatinine, Ser: 0.81 mg/dL (ref 0.44–1.00)
GFR calc Af Amer: >60 mL/min (ref >60)
GFR calc non Af Amer: >60 mL/min (ref >60)
Glucose, Bld: 96 mg/dL (ref 70–99)
Potassium: 3.6 mmol/L (ref 3.5–5.1)
Sodium: 140 mmol/L (ref 135–145)
Total Bilirubin: 1 mg/dL (ref 0.3–1.2)
Total Protein: 7.6 g/dL (ref 6.5–8.1)

## 2018-03-22 LAB — CBC
HCT: 41.6 % (ref 36.0–46.0)
Hemoglobin: 13.4 g/dL (ref 12.0–15.0)
MCH: 27.6 pg (ref 26.0–34.0)
MCHC: 32.2 g/dL (ref 30.0–36.0)
MCV: 85.6 fL (ref 80.0–100.0)
Platelets: 283 10*3/uL (ref 150–400)
RBC: 4.86 MIL/uL (ref 3.87–5.11)
RDW: 13.5 % (ref 11.5–15.5)
WBC: 3.1 10*3/uL — AB (ref 4.0–10.5)
nRBC: 0 % (ref 0.0–0.2)

## 2018-03-22 LAB — I-STAT BETA HCG BLOOD, ED (MC, WL, AP ONLY): I-stat hCG, quantitative: <5 m[IU]/mL (ref <5)

## 2018-03-22 LAB — URINALYSIS, ROUTINE W REFLEX MICROSCOPIC
BILIRUBIN URINE: NEGATIVE
Glucose, UA: NEGATIVE mg/dL
Hgb urine dipstick: NEGATIVE
Ketones, ur: 20 mg/dL — AB
NITRITE: NEGATIVE
Protein, ur: 30 mg/dL — AB
Specific Gravity, Urine: 1.028 (ref 1.005–1.030)
pH: 7 (ref 5.0–8.0)

## 2018-03-22 LAB — LIPASE, BLOOD: Lipase: 37 U/L (ref 11–51)

## 2018-03-22 MED ORDER — LACTATED RINGERS IV BOLUS
1000.0000 mL | Freq: Once | INTRAVENOUS | Status: AC
  Administered 2018-03-22: 1000 mL via INTRAVENOUS

## 2018-03-22 MED ORDER — DIPHENOXYLATE-ATROPINE 2.5-0.025 MG PO TABS: 1.0000 | ORAL_TABLET | Freq: Four times a day (QID) | ORAL | 0 refills | Status: DC | PRN

## 2018-03-22 NOTE — ED Notes (Signed)
Pt reports that she will try to provide urine sample when fluids are finished

## 2018-03-22 NOTE — ED Notes (Signed)
Pt ambulatory to bathroom and attempting to provide samples

## 2018-03-22 NOTE — ED Triage Notes (Signed)
Pt c/o abd pains with n/v/d x week. Reports 6 loose stools in past 24 hours.

## 2018-03-22 NOTE — ED Provider Notes (Signed)
[facility] DEPT Provider Note   CSN: [id number] Arrival date & time: 03/22/18  2353    History   Chief Complaint Chief Complaint  Patient presents with  . Abdominal Pain  . Emesis  . Diarrhea    HPI [name] is a 28 y.o. female.     HPI  28 year old female presents with abdominal pain, vomiting, and diarrhea.  Started 8 days ago.  She states that she vomits early in the morning and then afterwards no vomiting.  She is been having diarrhea about 6 times per day throughout this time.  She will get some diffuse abdominal pain after vomiting or just prior to having the diarrhea.  There is no blood in her stools.  No blood in her emesis.  She has not had a fever, back pain, or urinary symptoms.  She states she is been having a lot of decreased appetite over several months. No recent travel or antibiotics.  Past Medical History:  Diagnosis Date  . Anemia 2010  . Anxiety   . Asthma   . Depression    h/o pp depression after 1st pregnancy  . GERD (gastroesophageal reflux disease)     Patient Active Problem List   Diagnosis Date Noted  . PTSD (post-traumatic stress disorder) 03/14/2018  . Panic disorder   . Benzodiazepine dependence ([place])   . MDD (major depressive disorder), recurrent episode, severe ([place]) 02/22/2018  . Pain in joint, shoulder region 12/22/2012  . S/P cesarean section 07/02/2012  . 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 Asthma 02/17/2012  . Nausea/vomiting in pregnancy 11/05/2011    Past Surgical History:  Procedure Laterality Date  . CESAREAN SECTION  11/01/2008  . CESAREAN SECTION N/A 06/29/2012   Procedure: CESAREAN SECTION;  Surgeon: Melina Schools, MD;  Location: [facility];  Service: Obstetrics;  Laterality: N/A;  1 1/2 hrs OR time   . CESAREAN SECTION N/A 12/18/2014   Procedure: REPEAT CESAREAN SECTION;  Surgeon: Frederico Hamman, MD;  Location: [facility];  Service: Obstetrics;  Laterality: N/A;  . WISDOM TOOTH EXTRACTION        OB History    Gravida  4   Para  3   Term  3   Preterm  0   AB  1   Living  1     SAB  0   TAB  1   Ectopic  0   Multiple  0   Live Births  1            Home Medications    Prior to Admission medications   Medication Sig Start Date End Date Taking? Authorizing Provider  albuterol (PROVENTIL HFA;VENTOLIN HFA) 108 (90 Base) MCG/ACT inhaler Inhale 2 puffs into the lungs every 4 (four) hours as needed for wheezing or shortness of breath. 02/27/18   Money, Lowry Ram, FNP  chlordiazePOXIDE (LIBRIUM) 5 MG capsule Take 1 capsule (5 mg total) by mouth 3 (three) times daily as needed for anxiety. 03/07/18   Pucilowski, Marchia Bond, MD  diphenoxylate-atropine (LOMOTIL) 2.5-0.025 MG tablet Take 1 tablet by mouth 4 (four) times daily as needed for diarrhea or loose stools. 03/22/18   Sherwood Gambler, MD  propranolol (INDERAL) 20 MG tablet Take 1 tablet (20 mg total) by mouth 2 (two) times daily for 30 days. 03/14/18 04/13/18  Pucilowski, Marchia Bond, MD  sertraline (ZOLOFT) 50 MG tablet Take 1 tablet (50 mg total) by mouth daily for 30 days. 03/04/18 04/03/18  Pucilowski, Olgierd A,  MD  traZODone (DESYREL) 150 MG tablet Take 1 tablet (150 mg total) by mouth at bedtime as needed for up to 30 days for sleep. 03/14/18 04/13/18  Pucilowski, Marchia Bond, MD    Family History Family History  Problem Relation Age of Onset  . Asthma Mother   . Arthritis Mother   . Diabetes Mother   . Hypertension Mother   . Allergies Mother   . Hypertension Father   . Asthma Sister   . Allergies Son   . Asthma Son   . Other Neg Hx     Social History Social History   Tobacco Use  . Smoking status: Former Smoker    Packs/day: 0.25    Years: 2.00    Pack years: 0.50    Types: Cigarettes    Last attempt to quit: 04/14/2014    Years since quitting: 3.9  . Smokeless tobacco: Never Used  Substance Use Topics  . Alcohol use: Yes    Alcohol/week: 2.0 standard drinks    Types: 2 Standard drinks or  equivalent per week    Comment: occasionally  . Drug use: Yes    Frequency: 2.0 times per week    Types: Marijuana     Allergies   Patient has no known allergies.   Review of Systems Review of Systems  Constitutional: Negative for fever.  Gastrointestinal: Positive for abdominal pain, diarrhea and vomiting. Negative for blood in stool and nausea.  Genitourinary: Negative for dysuria.  Musculoskeletal: Negative for back pain.  All other systems reviewed and are negative.    Physical Exam Updated Vital Signs BP 106/74   Pulse 72   Temp 99 F (37.2 C) (Oral)   Resp 18   Wt 63.3 kg   LMP 03/15/2018   SpO2 100%   BMI 22.86 kg/m   Physical Exam Vitals signs and nursing note reviewed.  Constitutional:      General: She is not in acute distress.    Appearance: She is well-developed. She is not ill-appearing or diaphoretic.  HENT:     Head: Normocephalic and atraumatic.     Right Ear: External ear normal.     Left Ear: External ear normal.     Nose: Nose normal.  Eyes:     General:        Right eye: No discharge.        Left eye: No discharge.  Cardiovascular:     Rate and Rhythm: Normal rate and regular rhythm.     Heart sounds: Normal heart sounds.  Pulmonary:     Effort: Pulmonary effort is normal.     Breath sounds: Normal breath sounds.  Abdominal:     General: There is no distension.     Palpations: Abdomen is soft.     Tenderness: There is no abdominal tenderness.  Skin:    General: Skin is warm and dry.  Neurological:     Mental Status: She is alert.  Psychiatric:        Mood and Affect: Mood is not anxious.      ED Treatments / Results  Labs (all labs ordered are listed, but only abnormal results are displayed) Labs Reviewed  COMPREHENSIVE METABOLIC PANEL - Abnormal; Notable for the following components:      Result Value   Alkaline Phosphatase 36 (*)    All other components within normal limits  CBC - Abnormal; Notable for the following  components:   WBC 3.1 (*)    All other components  within normal limits  URINALYSIS, ROUTINE W REFLEX MICROSCOPIC - Abnormal; Notable for the following components:   APPearance HAZY (*)    Ketones, ur 20 (*)    Protein, ur 30 (*)    Leukocytes,Ua TRACE (*)    Bacteria, UA FEW (*)    All other components within normal limits  GASTROINTESTINAL PANEL BY PCR, STOOL (REPLACES STOOL CULTURE)  LIPASE, BLOOD  I-STAT BETA HCG BLOOD, ED ([facility], [facility], [facility] ONLY)    EKG None  Radiology No results found.  Procedures Procedures (including critical care time)  Medications Ordered in ED Medications  lactated ringers bolus 1,000 mL (0 mLs Intravenous Stopped 03/22/18 1214)     Initial Impression / Assessment and Plan / ED Course  I have reviewed the triage vital signs and the nursing notes.  Pertinent labs & imaging results that were available during my care of the patient were reviewed by me and considered in my medical decision making (see chart for details).        Patient unable to provide a bowel movement here.  No urine symptoms, mild WBCs in her urine are likely from the squamous epithelial cells/dirty catch.  She was given some fluids but her labs are overall benign.  Unclear why she is having the diarrhea but with stable vitals and benign abdominal exam I do not think an emergent CT or other imaging is needed.  She will be referred to GI and a PCP but appears stable for discharge home.  Final Clinical Impressions(s) / ED Diagnoses   Final diagnoses:  Diarrhea, unspecified type    ED Discharge Orders         Ordered    diphenoxylate-atropine (LOMOTIL) 2.5-0.025 MG tablet  4 times daily PRN     03/22/18 1250           Sherwood Gambler, MD 03/22/18 1546

## 2018-03-22 NOTE — ED Notes (Signed)
Pt aware that we need urine sample. 

## 2018-03-22 NOTE — Discharge Instructions (Signed)
If you develop severe abdominal pain, fever, blood in your stool, or any other new/concerning symptoms then return to the ER for evaluation.  Do not take the Lomotil/medicine prescribed more than prescribed

## 2018-04-04 ENCOUNTER — Encounter (HOSPITAL_COMMUNITY): Payer: Self-pay | Admitting: Psychiatry

## 2018-04-04 ENCOUNTER — Ambulatory Visit (INDEPENDENT_AMBULATORY_CARE_PROVIDER_SITE_OTHER): Payer: Medicaid Other | Admitting: Psychiatry

## 2018-04-04 VITALS — BP 118/73 | HR 84 | Ht 65.5 in | Wt 138.0 lb

## 2018-04-04 DIAGNOSIS — F410 Panic disorder [episodic paroxysmal anxiety] without agoraphobia: Secondary | ICD-10-CM | POA: Diagnosis not present

## 2018-04-04 DIAGNOSIS — F4310 Post-traumatic stress disorder, unspecified: Secondary | ICD-10-CM

## 2018-04-04 MED ORDER — CHLORDIAZEPOXIDE HCL 5 MG PO CAPS: 5.0000 mg | ORAL_CAPSULE | Freq: Three times a day (TID) | ORAL | 0 refills | Status: DC | PRN

## 2018-04-04 MED ORDER — PRAZOSIN HCL 1 MG PO CAPS: 1.0000 mg | ORAL_CAPSULE | Freq: Every day | ORAL | 0 refills | Status: DC

## 2018-04-04 MED ORDER — MIRTAZAPINE 15 MG PO TABS: 15.0000 mg | ORAL_TABLET | Freq: Every day | ORAL | 0 refills | Status: DC

## 2018-04-04 NOTE — Progress Notes (Signed)
[place] MD/PA/NP OP Progress Note  04/04/2018 8:10 AM [name]  MRN:  [id number]  Chief Complaint: Anxiety HPI: 28 yo s AAF with PTSD/panic disorder who could not tolerate escitalopram and has been therefore switched to sertraline which she did well on in the past. She developed nausea and diarrhea and stopped taking sertraline. These symptoms however continued for 10 days after medication was stopped so it is unlikely they were related to Zoloft. She stopped taking trazodone out of concern that she will "feel dependent on it" and sleep remains fair but she still reports having nightmares. There is no SI. She also stopped taking propranolol as it did not help with anxiety or nightmares.  She lost appetite, lost some weight, feels tires and not stable enough to return to work as yet (was supposed to return today). She takes Librium prn anxiety - not every day though. She has not started counseling as her appointment was on the day she went to ED to enquire about diarrhea, vomiting weight loss. All labs were normal and she was only prescribed Lomotil.  Visit Diagnosis:    ICD-10-CM   1. Panic disorder F41.0   2. PTSD (post-traumatic stress disorder) F43.10     Past Psychiatric History: Please refer to original H&P from 03/14/18.  Past Medical History:  Past Medical History:  Diagnosis Date  . Anemia 2010  . Anxiety   . Asthma   . Depression    h/o pp depression after 1st pregnancy  . GERD (gastroesophageal reflux disease)     Past Surgical History:  Procedure Laterality Date  . CESAREAN SECTION  11/01/2008  . CESAREAN SECTION N/A 06/29/2012   Procedure: CESAREAN SECTION;  Surgeon: Melina Schools, MD;  Location: [facility];  Service: Obstetrics;  Laterality: N/A;  1 1/2 hrs OR time   . CESAREAN SECTION N/A 12/18/2014   Procedure: REPEAT CESAREAN SECTION;  Surgeon: Frederico Hamman, MD;  Location: [facility];  Service: Obstetrics;  Laterality: N/A;  . WISDOM TOOTH EXTRACTION      Family  Psychiatric History: reviewed   Family History:  Family History  Problem Relation Age of Onset  . Asthma Mother   . Arthritis Mother   . Diabetes Mother   . Hypertension Mother   . Allergies Mother   . Hypertension Father   . Asthma Sister   . Allergies Son   . Asthma Son   . Other Neg Hx     Social History:  Social History   Socioeconomic History  . Marital status: Married    Spouse name: Not on file  . Number of children: 3  . Years of education: Not on file  . Highest education level: Bachelor's degree (e.g., BA, AB, BS)  Occupational History  . Not on file  Social Needs  . Financial resource strain: Not hard at all  . Food insecurity:    Worry: Never true    Inability: Never true  . Transportation needs:    Medical: No    Non-medical: No  Tobacco Use  . Smoking status: Former Smoker    Packs/day: 0.25    Years: 2.00    Pack years: 0.50    Types: Cigarettes    Last attempt to quit: 04/14/2014    Years since quitting: 3.9  . Smokeless tobacco: Never Used  Substance and Sexual Activity  . Alcohol use: Yes    Alcohol/week: 2.0 standard drinks    Types: 2 Standard drinks or equivalent per week  Comment: occasionally  . Drug use: Yes    Frequency: 2.0 times per week    Types: Marijuana  . Sexual activity: Not Currently    Birth control/protection: None    Comment: pt will consider using condoms  Lifestyle  . Physical activity:    Days per week: 0 days    Minutes per session: 0 min  . Stress: Very much  Relationships  . Social connections:    Talks on phone: Not on file    Gets together: Not on file    Attends religious service: Never    Active member of club or organization: No    Attends meetings of clubs or organizations: Never    Relationship status: Married  Other Topics Concern  . Not on file  Social History Narrative  . Not on file    Allergies: No Known Allergies  Metabolic Disorder Labs: Lab Results  Component Value Date   HGBA1C  5.0% 12/01/2012   No results found for: PROLACTIN No results found for: CHOL, TRIG, HDL, CHOLHDL, VLDL, LDLCALC Lab Results  Component Value Date   TSH 3.934 02/23/2018   TSH 0.760 12/01/2012    Therapeutic Level Labs: No results found for: LITHIUM No results found for: VALPROATE No components found for:  CBMZ  Current Medications: Current Outpatient Medications  Medication Sig Dispense Refill  . albuterol (PROVENTIL HFA;VENTOLIN HFA) 108 (90 Base) MCG/ACT inhaler Inhale 2 puffs into the lungs every 4 (four) hours as needed for wheezing or shortness of breath. 1 Inhaler 0  . chlordiazePOXIDE (LIBRIUM) 5 MG capsule Take 1 capsule (5 mg total) by mouth 3 (three) times daily as needed for anxiety. 90 capsule 0  . diphenoxylate-atropine (LOMOTIL) 2.5-0.025 MG tablet Take 1 tablet by mouth 4 (four) times daily as needed for diarrhea or loose stools. 16 tablet 0  . propranolol (INDERAL) 20 MG tablet Take 1 tablet (20 mg total) by mouth 2 (two) times daily for 30 days. 60 tablet 0  . sertraline (ZOLOFT) 50 MG tablet Take 1 tablet (50 mg total) by mouth daily for 30 days. 30 tablet 0  . traZODone (DESYREL) 150 MG tablet Take 1 tablet (150 mg total) by mouth at bedtime as needed for up to 30 days for sleep. 30 tablet 0   No current facility-administered medications for this visit.      Musculoskeletal: Strength & Muscle Tone: within normal limits Gait & Station: normal Patient leans: N/A  Psychiatric Specialty Exam: Review of Systems  Constitutional: Positive for weight loss.  HENT: Negative.   Eyes: Negative.   Respiratory: Negative.   Cardiovascular: Negative.   Gastrointestinal: Positive for diarrhea and nausea.  Genitourinary: Negative.   Musculoskeletal: Negative.   Skin: Negative.   Neurological: Negative.   Endo/Heme/Allergies: Negative.   Psychiatric/Behavioral: The patient is nervous/anxious.     Last menstrual period 03/15/2018, unknown if currently  breastfeeding.There is no height or weight on file to calculate BMI.  General Appearance: Well Groomed  Eye Contact:  Good  Speech:  Clear and Coherent  Volume:  Normal  Mood:  Anxious  Affect:  Constricted  Thought Process:  Goal Directed  Orientation:  Full (Time, Place, and Person)  Thought Content: Logical   Suicidal Thoughts:  No  Homicidal Thoughts:  No  Memory:  Immediate;   Good Recent;   Good Remote;   Good  Judgement:  Intact  Insight:  Fair  Psychomotor Activity:  Normal  Concentration:  Concentration: Good  Recall:  Good  Fund of Knowledge: Good  Language: Good  Akathisia:  Negative  Handed:  Right  AIMS (if indicated): not done  Assets:  Communication Skills Desire for Improvement  ADL's:  Intact  Cognition: WNL  Sleep:  Fair   Screenings: AIMS     Admission (Discharged) from OP Visit from 02/22/2018 in [facility]  AIMS Total Score  0    AUDIT     Admission (Discharged) from OP Visit from 02/22/2018 in [facility]  Alcohol Use Disorder Identification Test Final Score (AUDIT)  1       Assessment and Plan: 28 yo AAF with PTSD/panic disorder. She has been struggling with nausea/vomiting, diarrhea for last 3 weeks. Stopped taking Zoloft, trazodone and propranolol. Sporadic use of Librium prn panic attacks. Nightmares continue. Labs done at ED normal. I will try mirtazapine 15 mg at HS for anxiety/insomnia/poor appetite and prazosin for nightmares. Continue Librium prn anxiety attacks. She will see therapist eventually. Once we ger FMLA papers we will extend her leave until March 16 (Monday). Patient will return to clinic in one month.   Stephanie Acre, MD 04/04/2018, 8:10 AM

## 2018-04-11 ENCOUNTER — Ambulatory Visit (HOSPITAL_COMMUNITY): Payer: Medicaid Other | Admitting: Psychiatry

## 2018-04-19 ENCOUNTER — Other Ambulatory Visit: Payer: Self-pay

## 2018-04-19 ENCOUNTER — Ambulatory Visit (INDEPENDENT_AMBULATORY_CARE_PROVIDER_SITE_OTHER): Payer: Medicaid Other | Admitting: Licensed Clinical Social Worker

## 2018-04-19 DIAGNOSIS — F330 Major depressive disorder, recurrent, mild: Secondary | ICD-10-CM | POA: Diagnosis not present

## 2018-04-19 DIAGNOSIS — F4310 Post-traumatic stress disorder, unspecified: Secondary | ICD-10-CM

## 2018-04-19 DIAGNOSIS — F410 Panic disorder [episodic paroxysmal anxiety] without agoraphobia: Secondary | ICD-10-CM | POA: Diagnosis not present
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
## 2018-04-28 DIAGNOSIS — B373 Candidiasis of vulva and vagina: Secondary | ICD-10-CM | POA: Diagnosis not present

## 2018-04-28 DIAGNOSIS — Z113 Encounter for screening for infections with a predominantly sexual mode of transmission: Secondary | ICD-10-CM | POA: Diagnosis not present

## 2018-05-05 ENCOUNTER — Ambulatory Visit (INDEPENDENT_AMBULATORY_CARE_PROVIDER_SITE_OTHER): Payer: Medicaid Other | Admitting: Psychiatry

## 2018-05-05 ENCOUNTER — Other Ambulatory Visit: Payer: Self-pay

## 2018-05-05 ENCOUNTER — Ambulatory Visit (HOSPITAL_COMMUNITY): Payer: Medicaid Other | Admitting: Licensed Clinical Social Worker

## 2018-05-05 DIAGNOSIS — F4310 Post-traumatic stress disorder, unspecified: Secondary | ICD-10-CM

## 2018-05-05 DIAGNOSIS — F410 Panic disorder [episodic paroxysmal anxiety] without agoraphobia: Secondary | ICD-10-CM

## 2018-05-05 MED ORDER — CHLORDIAZEPOXIDE HCL 5 MG PO CAPS: 5.0000 mg | ORAL_CAPSULE | Freq: Three times a day (TID) | ORAL | 0 refills | Status: DC | PRN

## 2018-05-05 MED ORDER — PRAZOSIN HCL 1 MG PO CAPS: 1.0000 mg | ORAL_CAPSULE | Freq: Every day | ORAL | 0 refills | Status: DC

## 2018-05-05 MED ORDER — MIRTAZAPINE 15 MG PO TABS: 15.0000 mg | ORAL_TABLET | Freq: Every day | ORAL | 0 refills | Status: DC

## 2018-05-05 NOTE — Progress Notes (Signed)
[facility] MD/PA/NP OP Progress Note  05/05/2018 8:14 AM [name]  MRN:  [id number]  Interview was conducted using teleconferencing and I verified that I was speaking with the correct person using two identifiers. I discussed the limitations of evaluation and management by telemedicine and  the availability of in person appointments. Patient expressed understanding and agreed to proceed.  Chief Complaint: Some anxiety.  HPI: 28 yo AAF with PTSD/panic disorder. She has with nausea/vomiting, diarrhea in recent past. Because of that she stopped taking Zoloft, trazodone and propranolol. Sporadic use of Librium prn panic attacks, she did not pick up new prescription yet. Episodic nightmares. We decided to try mirtazapine 15 mg at HS for anxiety/insomnia/poor appetite and prazosin for nightmares and continue Librium prn anxiety attacks. Interestingly enough she has not made it to pharmacy yet and has not picked up prescriptions. She did use trazodone for a few nights to help with sleep (from past prescription). No SI, no depression but still admits to having some anxiety, irritability, nightmares. She is staying at home witn her two young daughters. She has seen therapist (K.Broone) fist time two weeks ago. She has not returned yet to work (at [employer]) and is not sure if her FMLA forms were send to [place] (we will check).    Visit Diagnosis:    ICD-10-CM   1. PTSD (post-traumatic stress disorder) F43.10 mirtazapine (REMERON) 15 MG tablet    prazosin (MINIPRESS) 1 MG capsule  2. Panic disorder F41.0 chlordiazePOXIDE (LIBRIUM) 5 MG capsule    DISCONTINUED: chlordiazePOXIDE (LIBRIUM) 5 MG capsule    Past Psychiatric History: Please refer to intake H&P.  Past Medical History:  Past Medical History:  Diagnosis Date  . Anemia 2010  . Anxiety   . Asthma   . Depression    h/o pp depression after 1st pregnancy  . GERD (gastroesophageal reflux disease)     Past Surgical History:  Procedure Laterality Date  .  CESAREAN SECTION  11/01/2008  . CESAREAN SECTION N/A 06/29/2012   Procedure: CESAREAN SECTION;  Surgeon: Melina Schools, MD;  Location: [facility];  Service: Obstetrics;  Laterality: N/A;  1 1/2 hrs OR time   . CESAREAN SECTION N/A 12/18/2014   Procedure: REPEAT CESAREAN SECTION;  Surgeon: Frederico Hamman, MD;  Location: [facility];  Service: Obstetrics;  Laterality: N/A;  . WISDOM TOOTH EXTRACTION      Family Psychiatric History: Reviewed  Family History:  Family History  Problem Relation Age of Onset  . Asthma Mother   . Arthritis Mother   . Diabetes Mother   . Hypertension Mother   . Allergies Mother   . Hypertension Father   . Asthma Sister   . Allergies Son   . Asthma Son   . Other Neg Hx     Social History:  Social History   Socioeconomic History  . Marital status: Married    Spouse name: Not on file  . Number of children: 3  . Years of education: Not on file  . Highest education level: Bachelor's degree (e.g., BA, AB, BS)  Occupational History  . Not on file  Social Needs  . Financial resource strain: Not hard at all  . Food insecurity:    Worry: Never true    Inability: Never true  . Transportation needs:    Medical: No    Non-medical: No  Tobacco Use  . Smoking status: Former Smoker    Packs/day: 0.25    Years: 2.00    Pack years:  0.50    Types: Cigarettes    Last attempt to quit: 04/14/2014    Years since quitting: 4.0  . Smokeless tobacco: Never Used  Substance and Sexual Activity  . Alcohol use: Yes    Alcohol/week: 2.0 standard drinks    Types: 2 Standard drinks or equivalent per week    Comment: occasionally  . Drug use: Yes    Frequency: 2.0 times per week    Types: Marijuana  . Sexual activity: Not Currently    Birth control/protection: None    Comment: pt will consider using condoms  Lifestyle  . Physical activity:    Days per week: 0 days    Minutes per session: 0 min  . Stress: Very much  Relationships  . Social connections:    Talks  on phone: Not on file    Gets together: Not on file    Attends religious service: Never    Active member of club or organization: No    Attends meetings of clubs or organizations: Never    Relationship status: Married  Other Topics Concern  . Not on file  Social History Narrative  . Not on file    Allergies: No Known Allergies  Metabolic Disorder Labs: Lab Results  Component Value Date   HGBA1C 5.0% 12/01/2012   No results found for: PROLACTIN No results found for: CHOL, TRIG, HDL, CHOLHDL, VLDL, LDLCALC Lab Results  Component Value Date   TSH 3.934 02/23/2018   TSH 0.760 12/01/2012    Therapeutic Level Labs: No results found for: LITHIUM No results found for: VALPROATE No components found for:  CBMZ  Current Medications: Current Outpatient Medications  Medication Sig Dispense Refill  . albuterol (PROVENTIL HFA;VENTOLIN HFA) 108 (90 Base) MCG/ACT inhaler Inhale 2 puffs into the lungs every 4 (four) hours as needed for wheezing or shortness of breath. 1 Inhaler 0  . chlordiazePOXIDE (LIBRIUM) 5 MG capsule Take 1 capsule (5 mg total) by mouth 3 (three) times daily as needed for anxiety. 90 capsule 0  . diphenoxylate-atropine (LOMOTIL) 2.5-0.025 MG tablet Take 1 tablet by mouth 4 (four) times daily as needed for diarrhea or loose stools. 16 tablet 0  . mirtazapine (REMERON) 15 MG tablet Take 1 tablet (15 mg total) by mouth at bedtime for 30 days. 30 tablet 0  . prazosin (MINIPRESS) 1 MG capsule Take 1 capsule (1 mg total) by mouth at bedtime for 30 days. 30 capsule 0   No current facility-administered medications for this visit.     Psychiatric Specialty Exam: Review of Systems  Psychiatric/Behavioral: The patient is nervous/anxious and has insomnia.   All other systems reviewed and are negative.   unknown if currently breastfeeding.There is no height or weight on file to calculate BMI.  General Appearance: NA  Eye Contact:  NA  Speech:  Clear and Coherent  Volume:   Normal  Mood:  Anxious  Affect:  NA  Thought Process:  Goal Directed  Orientation:  Full (Time, Place, and Person)  Thought Content: Logical   Suicidal Thoughts:  No  Homicidal Thoughts:  No  Memory:  Immediate;   Good Recent;   Good Remote;   Good  Judgement:  Fair  Insight:  Fair  Psychomotor Activity:  NA  Concentration:  Concentration: Fair  Recall:  Good  Fund of Knowledge: Good  Language: Good  Akathisia:  NA  Handed:  Right  AIMS (if indicated): not done  Assets:  Desire for Improvement Housing Physical Health Resilience  ADL's:  Intact  Cognition: WNL  Sleep:  Fair   Screenings: AIMS     Admission (Discharged) from OP Visit from 02/22/2018 in [facility]  AIMS Total Score  0    AUDIT     Admission (Discharged) from OP Visit from 02/22/2018 in [facility]  Alcohol Use Disorder Identification Test Final Score (AUDIT)  1       Assessment and Plan: 28 yo AAF with PTSD/panic disorder. She has with nausea/vomiting, diarrhea in recent past. Because of that she stopped taking Zoloft, trazodone and propranolol. Sporadic use of Librium prn panic attacks, she did not pick up new prescription yet. Episodic nightmares. We decided to try mirtazapine 15 mg at HS for anxiety/insomnia/poor appetite and prazosin for nightmares and continue Librium prn anxiety attacks. Interestingly enough she has not made it to pharmacy yet and has not picked up prescriptions. She did use trazodone for a few nights to help with sleep (from past prescription). No SI, no depression but still admits to having some anxiety, irritability, nightmares. She is staying at home witn her two young daughters. She has seen therapist (K.Broone) fist time two weeks ago. She has not returned yet to work (at [employer]) and is not sure if her FMLA forms were send to [place] (we will check).  I encouraged her to pick up Remeron and prazosin prescriptions and give  these medication a try. I also ordered additional refill on all. Next medication management visit in 4 weeks.    Stephanie Acre, MD 05/05/2018, 8:14 AM

## 2018-05-11 ENCOUNTER — Ambulatory Visit (HOSPITAL_COMMUNITY): Payer: Medicaid Other | Admitting: Licensed Clinical Social Worker

## 2018-05-11 ENCOUNTER — Other Ambulatory Visit: Payer: Self-pay

## 2018-06-09 ENCOUNTER — Ambulatory Visit (HOSPITAL_COMMUNITY): Payer: Medicaid Other | Admitting: Psychiatry

## 2018-06-13 ENCOUNTER — Other Ambulatory Visit: Payer: Self-pay

## 2018-06-13 ENCOUNTER — Ambulatory Visit (INDEPENDENT_AMBULATORY_CARE_PROVIDER_SITE_OTHER): Payer: Medicaid Other | Admitting: Licensed Clinical Social Worker

## 2018-06-13 DIAGNOSIS — F4310 Post-traumatic stress disorder, unspecified: Secondary | ICD-10-CM
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
## 2018-06-19 DIAGNOSIS — Z113 Encounter for screening for infections with a predominantly sexual mode of transmission: Secondary | ICD-10-CM | POA: Diagnosis not present

## 2018-06-19 DIAGNOSIS — J309 Allergic rhinitis, unspecified: Secondary | ICD-10-CM | POA: Diagnosis not present

## 2018-06-19 DIAGNOSIS — J45909 Unspecified asthma, uncomplicated: Secondary | ICD-10-CM | POA: Diagnosis not present

## 2018-06-28 ENCOUNTER — Other Ambulatory Visit: Payer: Self-pay

## 2018-06-28 ENCOUNTER — Ambulatory Visit (INDEPENDENT_AMBULATORY_CARE_PROVIDER_SITE_OTHER): Payer: Medicaid Other | Admitting: Licensed Clinical Social Worker

## 2018-06-28 DIAGNOSIS — F410 Panic disorder [episodic paroxysmal anxiety] without agoraphobia: Secondary | ICD-10-CM

## 2018-06-28 NOTE — Progress Notes (Signed)
Virtual Visit via Telephone Note  I connected with [name] on 06/28/18 at  3:00 PM EDT by telephone and verified that I am speaking with the correct person using two identifiers.   I discussed the limitations, risks, security and privacy concerns of performing an evaluation and management service by telephone and the availability of in person appointments. I also discussed with the patient that there may be a patient responsible charge related to this service. The patient expressed understanding and agreed to proceed. I discussed the assessment and treatment plan with the patient. The patient was provided an opportunity to ask questions and all were answered. The patient agreed with the plan and demonstrated an understanding of the instructions.   The patient was advised to call back or seek an in-person evaluation if the symptoms worsen or if the condition fails to improve as anticipated.  I provided 30 minutes of non-face-to-face time during this encounter.   Olegario Messier, LCSW    THERAPIST PROGRESS NOTE  Session Time: 3pm-3:35pm  Participation Level: Active  Behavioral Response: NAAlertIrritable  Type of Therapy: Individual Therapy  Treatment Goals addressed: Anxiety and Coping  Interventions: CBT and Supportive  Summary: [name] is a 28 y.o. female who presents with symptoms of anxiety and aggitation. 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 Client reports not taking medications regularly due to unwanted side effects, however acknowledges she does not stay on them long enough for side effects to go away. Client shows little progress toward her goal of wanting to be separate from her son's father. 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   Suicidal/Homicidal: Nowithout intent/plan  Therapist Response: Clinician  chcked inwith client, assessing for SI/HI/psychosis and overall level of functioning. Clinician challenges client on behaviors which she dislikes in friends however displays herself during streaming. Clinician inquires about plans to return to work, which client does not know is she wants to do. Clinician followed up about I-statements from previous session which client reports she did not try.  Plan: Return again in 2 weeks with new provider.  Diagnosis: Axis I: MDD, Panic Disorder      Olegario Messier, LCSW 06/28/2018

## 2018-06-30 ENCOUNTER — Telehealth (HOSPITAL_COMMUNITY): Payer: Self-pay | Admitting: Psychiatry

## 2018-07-04 ENCOUNTER — Other Ambulatory Visit: Payer: Self-pay

## 2018-07-04 ENCOUNTER — Ambulatory Visit (INDEPENDENT_AMBULATORY_CARE_PROVIDER_SITE_OTHER): Payer: Medicaid Other | Admitting: Psychiatry

## 2018-07-04 DIAGNOSIS — F410 Panic disorder [episodic paroxysmal anxiety] without agoraphobia: Secondary | ICD-10-CM | POA: Diagnosis not present

## 2018-07-04 DIAGNOSIS — F4310 Post-traumatic stress disorder, unspecified: Secondary | ICD-10-CM

## 2018-07-04 MED ORDER — LORAZEPAM 1 MG PO TABS: 1.0000 mg | ORAL_TABLET | Freq: Three times a day (TID) | ORAL | 0 refills | Status: DC | PRN

## 2018-07-04 NOTE — Progress Notes (Signed)
[facility] MD/PA/NP OP Progress Note  07/04/2018 3:22 PM [name]  MRN:  [id number] Interview was conducted by telephone and I verified that I was speaking with the correct person using two identifiers. I discussed the limitations of evaluation and management by telemedicine and  the availability of in person appointments. Patient expressed understanding and agreed to proceed.  Chief Complaint: Anxiety, insomnia.  HPI: 28 yo AAF with PTSD/panic disorder.She has a hx of multiple somatic complaints related to high stress/anxiety (nausea/vomiting, diarrhea, tongue "stiuffening"). Because of these she stopped taking Zoloft, trazodone and propranolol. Sporadic use of Librium prn panic attacks, she did not pick up new prescription yet, and reports limited benefit from it. Infrequent nightmares. We decided to try mirtazapine 15 mg at HS for anxiety/insomnia/poor appetite and prazosin for nightmares and continue Librium prn anxiety attacks. She found 15 mg of mirtazapine to be "too strong" - felt excessively sedated the next day but now cannot fall asleep until 3-4 AM without any sleep aid. No SI, no depression but still admits to having some anxiety, occasional  nightmares. She is staying at home witn her two young daughters. She has been seen by therapist (K.Broone) but and finds these sessions very helpful.  .  Visit Diagnosis:    ICD-10-CM   1. Panic disorder F41.0   2. PTSD (post-traumatic stress disorder) F43.10     Past Psychiatric History: Please refer to intake H&P.  Past Medical History:  Past Medical History:  Diagnosis Date  . Anemia 2010  . Anxiety   . Asthma   . Depression    h/o pp depression after 1st pregnancy  . GERD (gastroesophageal reflux disease)     Past Surgical History:  Procedure Laterality Date  . CESAREAN SECTION  11/01/2008  . CESAREAN SECTION N/A 06/29/2012   Procedure: CESAREAN SECTION;  Surgeon: Melina Schools, MD;  Location: [facility];  Service: Obstetrics;   Laterality: N/A;  1 1/2 hrs OR time   . CESAREAN SECTION N/A 12/18/2014   Procedure: REPEAT CESAREAN SECTION;  Surgeon: Frederico Hamman, MD;  Location: [facility];  Service: Obstetrics;  Laterality: N/A;  . WISDOM TOOTH EXTRACTION      Family Psychiatric History: Reviewed.  Family History:  Family History  Problem Relation Age of Onset  . Asthma Mother   . Arthritis Mother   . Diabetes Mother   . Hypertension Mother   . Allergies Mother   . Hypertension Father   . Asthma Sister   . Allergies Son   . Asthma Son   . Other Neg Hx     Social History:  Social History   Socioeconomic History  . Marital status: Married    Spouse name: Not on file  . Number of children: 3  . Years of education: Not on file  . Highest education level: Bachelor's degree (e.g., BA, AB, BS)  Occupational History  . Not on file  Social Needs  . Financial resource strain: Not hard at all  . Food insecurity:    Worry: Never true    Inability: Never true  . Transportation needs:    Medical: No    Non-medical: No  Tobacco Use  . Smoking status: Former Smoker    Packs/day: 0.25    Years: 2.00    Pack years: 0.50    Types: Cigarettes    Last attempt to quit: 04/14/2014    Years since quitting: 4.2  . Smokeless tobacco: Never Used  Substance and Sexual Activity  .  Alcohol use: Yes    Alcohol/week: 2.0 standard drinks    Types: 2 Standard drinks or equivalent per week    Comment: occasionally  . Drug use: Yes    Frequency: 2.0 times per week    Types: Marijuana  . Sexual activity: Not Currently    Birth control/protection: None    Comment: pt will consider using condoms  Lifestyle  . Physical activity:    Days per week: 0 days    Minutes per session: 0 min  . Stress: Very much  Relationships  . Social connections:    Talks on phone: Not on file    Gets together: Not on file    Attends religious service: Never    Active member of club or organization: No    Attends meetings of clubs  or organizations: Never    Relationship status: Married  Other Topics Concern  . Not on file  Social History Narrative  . Not on file    Allergies: No Known Allergies  Metabolic Disorder Labs: Lab Results  Component Value Date   HGBA1C 5.0% 12/01/2012   No results found for: PROLACTIN No results found for: CHOL, TRIG, HDL, CHOLHDL, VLDL, LDLCALC Lab Results  Component Value Date   TSH 3.934 02/23/2018   TSH 0.760 12/01/2012    Therapeutic Level Labs: No results found for: LITHIUM No results found for: VALPROATE No components found for:  CBMZ  Current Medications: Current Outpatient Medications  Medication Sig Dispense Refill  . albuterol (PROVENTIL HFA;VENTOLIN HFA) 108 (90 Base) MCG/ACT inhaler Inhale 2 puffs into the lungs every 4 (four) hours as needed for wheezing or shortness of breath. 1 Inhaler 0  . diphenoxylate-atropine (LOMOTIL) 2.5-0.025 MG tablet Take 1 tablet by mouth 4 (four) times daily as needed for diarrhea or loose stools. 16 tablet 0  . LORazepam (ATIVAN) 1 MG tablet Take 1 tablet (1 mg total) by mouth every 8 (eight) hours as needed for anxiety. 60 tablet 0  . mirtazapine (REMERON) 15 MG tablet Take 1 tablet (15 mg total) by mouth at bedtime for 30 days. 30 tablet 0   No current facility-administered medications for this visit.     Psychiatric Specialty Exam: Review of Systems  Gastrointestinal: Positive for heartburn and nausea.  Psychiatric/Behavioral: The patient is nervous/anxious and has insomnia.   All other systems reviewed and are negative.   unknown if currently breastfeeding.There is no height or weight on file to calculate BMI.  General Appearance: NA  Eye Contact:  NA  Speech:  Clear and Coherent  Volume:  Normal  Mood:  Anxious  Affect:  NA  Thought Process:  Descriptions of Associations: Circumstantial  Orientation:  Full (Time, Place, and Person)  Thought Content: Logical   Suicidal Thoughts:  No  Homicidal Thoughts:  No   Memory:  Immediate;   Fair Recent;   Good Remote;   Good  Judgement:  Fair  Insight:  Fair  Psychomotor Activity:  NA  Concentration:  Concentration: Fair  Recall:  [place] of Knowledge: Good  Language: Good  Akathisia:  NA  Handed:  Right  AIMS (if indicated): not done  Assets:  Communication Skills Desire for Improvement Housing  ADL's:  Intact  Cognition: WNL  Sleep:  Poor   Screenings: AIMS     Admission (Discharged) from OP Visit from 02/22/2018 in [facility]  AIMS Total Score  0    AUDIT     Admission (Discharged) from OP  Visit from 02/22/2018 in [facility]  Alcohol Use Disorder Identification Test Final Score (AUDIT)  1       Assessment and Plan: 28 yo AAF with PTSD/panic disorder.She has a hx of multiple somatic complaints related to high stress/anxiety (nausea/vomiting, diarrhea, tongue "stiuffening"). Because of these she stopped taking Zoloft, trazodone and propranolol. Sporadic use of Librium prn panic attacks, she did not pick up new prescription yet, and reports limited benefit from it. Infrequent nightmares. We decided to try mirtazapine 15 mg at HS for anxiety/insomnia/poor appetite and prazosin for nightmares and continue Librium prn anxiety attacks. She found 15 mg of mirtazapine to be "too strong" - felt excessively sedated the next day but now cannot fall asleep until 3-4 AM without any sleep aid. No SI, no depression but still admits to having some anxiety, occasional  nightmares. She is staying at home witn her two young daughters. She has been seen by therapist (K.Broone) but and finds these sessions very helpful.   Plan: We will try 1/2 dose of Remeron (7.5 mg) for insomnia and change Librium to lorazepam 1 mg prn anxiety. Next visit in 4 weeks.    Stephanie Acre, MD 07/04/2018, 3:22 PM

## 2018-08-01 ENCOUNTER — Other Ambulatory Visit: Payer: Self-pay

## 2018-08-01 ENCOUNTER — Ambulatory Visit (INDEPENDENT_AMBULATORY_CARE_PROVIDER_SITE_OTHER): Payer: Medicaid Other | Admitting: Psychiatry

## 2018-08-01 DIAGNOSIS — F410 Panic disorder [episodic paroxysmal anxiety] without agoraphobia: Secondary | ICD-10-CM

## 2018-08-01 MED ORDER — LORAZEPAM 1 MG PO TABS: 1.0000 mg | ORAL_TABLET | Freq: Three times a day (TID) | ORAL | 0 refills | Status: DC | PRN
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
## 2018-08-02 ENCOUNTER — Ambulatory Visit (INDEPENDENT_AMBULATORY_CARE_PROVIDER_SITE_OTHER): Payer: Medicaid Other | Admitting: Psychology

## 2018-08-02 ENCOUNTER — Other Ambulatory Visit: Payer: Self-pay

## 2018-08-02 DIAGNOSIS — F410 Panic disorder [episodic paroxysmal anxiety] without agoraphobia: Secondary | ICD-10-CM
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
## 2018-08-18 DIAGNOSIS — Z113 Encounter for screening for infections with a predominantly sexual mode of transmission: Secondary | ICD-10-CM | POA: Diagnosis not present

## 2018-08-18 DIAGNOSIS — N760 Acute vaginitis: Secondary | ICD-10-CM | POA: Diagnosis not present

## 2018-08-22 ENCOUNTER — Ambulatory Visit (HOSPITAL_COMMUNITY): Payer: Medicaid Other | Admitting: Psychology

## 2018-08-24 DIAGNOSIS — Z760 Encounter for issue of repeat prescription: Secondary | ICD-10-CM | POA: Diagnosis not present

## 2018-08-30 ENCOUNTER — Other Ambulatory Visit: Payer: Self-pay

## 2018-08-30 ENCOUNTER — Ambulatory Visit (INDEPENDENT_AMBULATORY_CARE_PROVIDER_SITE_OTHER): Payer: Medicaid Other | Admitting: Psychiatry

## 2018-08-30 DIAGNOSIS — F3342 Major depressive disorder, recurrent, in full remission: Secondary | ICD-10-CM | POA: Diagnosis not present

## 2018-08-30 DIAGNOSIS — F4310 Post-traumatic stress disorder, unspecified: Secondary | ICD-10-CM | POA: Diagnosis not present

## 2018-08-30 MED ORDER — LORAZEPAM 1 MG PO TABS: 1.0000 mg | ORAL_TABLET | Freq: Three times a day (TID) | ORAL | 2 refills | Status: AC | PRN
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
## 2018-09-03 DIAGNOSIS — Z760 Encounter for issue of repeat prescription: Secondary | ICD-10-CM | POA: Diagnosis not present

## 2018-09-03 DIAGNOSIS — J45998 Other asthma: Secondary | ICD-10-CM | POA: Diagnosis not present

## 2018-09-27 DIAGNOSIS — Z760 Encounter for issue of repeat prescription: Secondary | ICD-10-CM | POA: Diagnosis not present

## 2018-09-27 DIAGNOSIS — R0602 Shortness of breath: Secondary | ICD-10-CM | POA: Diagnosis not present

## 2018-10-03 ENCOUNTER — Institutional Professional Consult (permissible substitution): Payer: Self-pay | Admitting: Internal Medicine

## 2018-10-05 ENCOUNTER — Institutional Professional Consult (permissible substitution): Payer: Self-pay | Admitting: Internal Medicine

## 2018-10-17 DIAGNOSIS — J45998 Other asthma: Secondary | ICD-10-CM | POA: Diagnosis not present

## 2018-10-17 DIAGNOSIS — Z760 Encounter for issue of repeat prescription: Secondary | ICD-10-CM | POA: Diagnosis not present

## 2018-10-25 DIAGNOSIS — Z760 Encounter for issue of repeat prescription: Secondary | ICD-10-CM | POA: Diagnosis not present

## 2018-11-02 ENCOUNTER — Ambulatory Visit (INDEPENDENT_AMBULATORY_CARE_PROVIDER_SITE_OTHER): Payer: Medicaid Other

## 2018-11-02 ENCOUNTER — Encounter: Payer: Self-pay | Admitting: Pulmonary Disease

## 2018-11-02 ENCOUNTER — Other Ambulatory Visit: Payer: Self-pay

## 2018-11-02 ENCOUNTER — Ambulatory Visit: Payer: Medicaid Other | Admitting: Pulmonary Disease

## 2018-11-02 VITALS — BP 110/62 | HR 80 | Ht 65.5 in | Wt 146.2 lb

## 2018-11-02 DIAGNOSIS — J4540 Moderate persistent asthma, uncomplicated: Secondary | ICD-10-CM

## 2018-11-02 DIAGNOSIS — J45909 Unspecified asthma, uncomplicated: Secondary | ICD-10-CM | POA: Diagnosis not present

## 2018-11-02 LAB — CBC WITH DIFFERENTIAL/PLATELET
Basophils Absolute: 0 10*3/uL (ref 0.0–0.1)
Basophils Relative: 0.5 % (ref 0.0–3.0)
Eosinophils Absolute: 0.1 10*3/uL (ref 0.0–0.7)
Eosinophils Relative: 4.9 % (ref 0.0–5.0)
HCT: 43.3 % (ref 36.0–46.0)
Hemoglobin: 14.1 g/dL (ref 12.0–15.0)
Lymphocytes Relative: 48.6 % — ABNORMAL HIGH (ref 12.0–46.0)
Lymphs Abs: 1.5 10*3/uL (ref 0.7–4.0)
MCHC: 32.5 g/dL (ref 30.0–36.0)
MCV: 87 fl (ref 78.0–100.0)
Monocytes Absolute: 0.1 10*3/uL (ref 0.1–1.0)
Monocytes Relative: 4.8 % (ref 3.0–12.0)
Neutro Abs: 1.2 10*3/uL — ABNORMAL LOW (ref 1.4–7.7)
Neutrophils Relative %: 41.2 % — ABNORMAL LOW (ref 43.0–77.0)
Platelets: 268 10*3/uL (ref 150.0–400.0)
RBC: 4.98 Mil/uL (ref 3.87–5.11)
RDW: 14.7 % (ref 11.5–15.5)
WBC: 3 10*3/uL — ABNORMAL LOW (ref 4.0–10.5)

## 2018-11-02 MED ORDER — BUDESONIDE-FORMOTEROL FUMARATE 160-4.5 MCG/ACT IN AERO: 2.0000 | INHALATION_SPRAY | Freq: Two times a day (BID) | RESPIRATORY_TRACT | 6 refills | Status: DC

## 2018-11-02 MED ORDER — BUDESONIDE-FORMOTEROL FUMARATE 160-4.5 MCG/ACT IN AERO: 2.0000 | INHALATION_SPRAY | Freq: Two times a day (BID) | RESPIRATORY_TRACT | 0 refills | Status: DC

## 2018-11-02 MED ORDER — MONTELUKAST SODIUM 10 MG PO TABS: 10.0000 mg | ORAL_TABLET | Freq: Every day | ORAL | 11 refills | Status: DC

## 2018-11-02 MED ORDER — ALBUTEROL SULFATE HFA 108 (90 BASE) MCG/ACT IN AERS: 2.0000 | INHALATION_SPRAY | RESPIRATORY_TRACT | 11 refills | Status: DC | PRN

## 2018-11-02 NOTE — Patient Instructions (Signed)
We will get a chest x-ray today, CBC with differential, IgE Schedule you for pulmonary function test  Start Symbicort 160, Singulair 10 mg a day We will prescribe an albuterol rescue inhaler Follow-up after pulmonary function tests
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
## 2018-11-03 LAB — IGE: IgE (Immunoglobulin E), Serum: 1024 kU/L — ABNORMAL HIGH (ref <=114)

## 2018-11-14 ENCOUNTER — Ambulatory Visit (HOSPITAL_COMMUNITY): Payer: Medicaid Other | Admitting: Licensed Clinical Social Worker

## 2018-11-15 ENCOUNTER — Ambulatory Visit (HOSPITAL_COMMUNITY): Payer: Medicaid Other | Admitting: Psychiatry

## 2018-11-17 ENCOUNTER — Ambulatory Visit (HOSPITAL_COMMUNITY): Payer: Medicaid Other | Admitting: Licensed Clinical Social Worker

## 2018-11-28 ENCOUNTER — Ambulatory Visit (HOSPITAL_COMMUNITY): Payer: Medicaid Other | Admitting: Licensed Clinical Social Worker

## 2019-01-20 DIAGNOSIS — Z113 Encounter for screening for infections with a predominantly sexual mode of transmission: Secondary | ICD-10-CM | POA: Diagnosis not present

## 2019-01-20 DIAGNOSIS — N7689 Other specified inflammation of vagina and vulva: Secondary | ICD-10-CM | POA: Diagnosis not present

## 2019-01-20 DIAGNOSIS — N898 Other specified noninflammatory disorders of vagina: Secondary | ICD-10-CM | POA: Diagnosis not present

## 2019-02-24 ENCOUNTER — Telehealth: Payer: Self-pay | Admitting: Pulmonary Disease

## 2019-02-24 MED ORDER — ALBUTEROL SULFATE HFA 108 (90 BASE) MCG/ACT IN AERS: 2.0000 | INHALATION_SPRAY | RESPIRATORY_TRACT | 11 refills | Status: DC | PRN

## 2019-02-24 NOTE — Telephone Encounter (Signed)
Pt requesting refill on albuterol.  This has been sent to her preferred pharmacy.  Nothing further needed at this time- will close encounter.

## 2019-03-23 ENCOUNTER — Other Ambulatory Visit: Payer: Self-pay

## 2019-03-23 ENCOUNTER — Ambulatory Visit (HOSPITAL_COMMUNITY): Payer: Medicaid Other | Admitting: Psychiatry

## 2019-04-03 ENCOUNTER — Encounter (HOSPITAL_COMMUNITY): Payer: Self-pay | Admitting: Psychology

## 2019-04-03 ENCOUNTER — Other Ambulatory Visit: Payer: Self-pay

## 2019-04-03 ENCOUNTER — Ambulatory Visit (HOSPITAL_COMMUNITY): Payer: Medicaid Other | Admitting: Psychology

## 2019-04-03 NOTE — Progress Notes (Signed)
[name] is a 29 y.o. female patient who didn't show for her virtual appointment.  Counselor attempted to call to number on file, but received a message "call cannot be completed at this time". Jan Fireman, [facility]

## 2019-04-05 ENCOUNTER — Other Ambulatory Visit: Payer: Self-pay

## 2019-04-05 ENCOUNTER — Ambulatory Visit (INDEPENDENT_AMBULATORY_CARE_PROVIDER_SITE_OTHER): Payer: Medicaid Other | Admitting: Psychiatry

## 2019-04-05 DIAGNOSIS — F4310 Post-traumatic stress disorder, unspecified: Secondary | ICD-10-CM

## 2019-04-05 DIAGNOSIS — F410 Panic disorder [episodic paroxysmal anxiety] without agoraphobia: Secondary | ICD-10-CM

## 2019-04-05 MED ORDER — CLONAZEPAM 1 MG PO TABS: 1.0000 mg | ORAL_TABLET | Freq: Two times a day (BID) | ORAL | 0 refills | Status: DC | PRN

## 2019-04-05 MED ORDER — QUETIAPINE FUMARATE 50 MG PO TABS: 50.0000 mg | ORAL_TABLET | Freq: Every day | ORAL | 0 refills | Status: DC
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
## 2019-05-03 ENCOUNTER — Ambulatory Visit (HOSPITAL_COMMUNITY): Payer: Medicaid Other | Admitting: Psychiatry

## 2019-05-03 ENCOUNTER — Other Ambulatory Visit: Payer: Self-pay

## 2019-05-03 MED ORDER — CLONAZEPAM 1 MG PO TABS: 1.0000 mg | ORAL_TABLET | Freq: Two times a day (BID) | ORAL | 0 refills | Status: DC | PRN

## 2019-05-03 MED ORDER — QUETIAPINE FUMARATE 50 MG PO TABS: 50.0000 mg | ORAL_TABLET | Freq: Every day | ORAL | 0 refills | Status: DC

## 2019-05-30 DIAGNOSIS — N760 Acute vaginitis: Secondary | ICD-10-CM | POA: Diagnosis not present

## 2019-05-30 DIAGNOSIS — Z113 Encounter for screening for infections with a predominantly sexual mode of transmission: Secondary | ICD-10-CM | POA: Diagnosis not present

## 2019-05-30 DIAGNOSIS — R109 Unspecified abdominal pain: Secondary | ICD-10-CM | POA: Diagnosis not present

## 2019-05-31 DIAGNOSIS — J45909 Unspecified asthma, uncomplicated: Secondary | ICD-10-CM | POA: Diagnosis not present

## 2019-06-09 ENCOUNTER — Telehealth: Payer: Self-pay | Admitting: Pulmonary Disease

## 2019-06-09 DIAGNOSIS — J4541 Moderate persistent asthma with (acute) exacerbation: Secondary | ICD-10-CM | POA: Diagnosis not present

## 2019-06-09 MED ORDER — ALBUTEROL SULFATE HFA 108 (90 BASE) MCG/ACT IN AERS: 2.0000 | INHALATION_SPRAY | RESPIRATORY_TRACT | 0 refills | Status: DC | PRN

## 2019-06-09 NOTE — Telephone Encounter (Signed)
Spoke with the pt   Rx for albuterol refilled  She asked for more than 1 inhaler, states that she is using inhaler frequently has has been for the past year  OV with Aaron Edelman for eval

## 2019-06-09 NOTE — Telephone Encounter (Signed)
Patient is returning phone call. Patient phone number is [phone].

## 2019-06-09 NOTE — Telephone Encounter (Signed)
LMTCB x 1 

## 2019-06-15 ENCOUNTER — Ambulatory Visit: Payer: Self-pay | Admitting: Pulmonary Disease

## 2019-06-15 NOTE — Progress Notes (Deleted)
@Patient  ID: [name], female    DOB: Nov 10, 1990, 29 y.o.   MRN: [id number]  No chief complaint on file.   Referring provider: No ref. provider found  HPI:  29 year old female former smoker followed in our office for asthma  PMH: Nausea vomiting, depression, PTSD, history of illicit drug abuse (ecstasy) Smoker/ Smoking History: Former smoker Maintenance:   Pt of: Dr. Vaughan Browner  06/15/2019  - Visit   29 year old female former smoker followed in our office for asthma.  Last seen in September/2020.  At that time patient was recommended to complete lab work as well as pulmonary function testing and started on Singulair.  Those results are listed below:   Questionaires / Pulmonary Flowsheets:   ACT:  Asthma Control Test ACT Total Score  11/02/2018 9    MMRC: No flowsheet data found.  [facility]:  No flowsheet data found.  Tests:   11/02/2018-IgE-1024 11/02/2018-CBC with differential-eosinophils relative 4.9, eosinophils absolute 0.1  02/23/2018-rapid drug screen-benzodiazepines positive, THC positive   FENO:  No results found for: NITRICOXIDE  PFT: No flowsheet data found.  WALK:  No flowsheet data found.  Imaging: No results found.  Lab Results:  CBC    Component Value Date/Time   WBC 3.0 (L) 11/02/2018 1235   RBC 4.98 11/02/2018 1235   HGB 14.1 11/02/2018 1235   HCT 43.3 11/02/2018 1235   PLT 268.0 11/02/2018 1235   MCV 87.0 11/02/2018 1235   MCH 27.6 03/22/2018 0845   MCHC 32.5 11/02/2018 1235   RDW 14.7 11/02/2018 1235   LYMPHSABS 1.5 11/02/2018 1235   MONOABS 0.1 11/02/2018 1235   EOSABS 0.1 11/02/2018 1235   BASOSABS 0.0 11/02/2018 1235    BMET    Component Value Date/Time   NA 140 03/22/2018 0845   K 3.6 03/22/2018 0845   CL 109 03/22/2018 0845   CO2 24 03/22/2018 0845   GLUCOSE 96 03/22/2018 0845   BUN 13 03/22/2018 0845   CREATININE 0.81 03/22/2018 0845   CREATININE 0.75 12/01/2012 0947   CALCIUM 9.0 03/22/2018 0845   GFRNONAA >60  03/22/2018 0845   GFRNONAA >89 12/01/2012 0947   GFRAA >60 03/22/2018 0845   GFRAA >89 12/01/2012 0947    BNP No results found for: BNP  ProBNP No results found for: PROBNP  Specialty Problems      Pulmonary Problems   Asthma    Followed in Pulmonary clinic/ [facility]/ [facility] - 08/22/2014 p extensive coaching HFA effectiveness =    90% > try symbicort 160 2bid          No Known Allergies  Immunization History  Administered Date(s) Administered  . Influenza Split 03/01/2012, 11/02/2013  . Influenza Whole 11/03/2011  . Influenza,inj,Quad PF,6+ Mos 12/01/2012  . Pneumococcal Polysaccharide-23 07/02/2012  . Tdap 03/29/2012, 06/25/2014    Past Medical History:  Diagnosis Date  . Anemia 2010  . Anxiety   . Asthma   . Depression    h/o pp depression after 1st pregnancy  . GERD (gastroesophageal reflux disease)     Tobacco History: Social History   Tobacco Use  Smoking Status Former Smoker  . Packs/day: 0.25  . Years: 2.00  . Pack years: 0.50  . Types: Cigarettes  . Quit date: 04/14/2014  . Years since quitting: 5.1  Smokeless Tobacco Never Used   Counseling given: Not Answered   Continue to not smoke  Outpatient Encounter Medications as of 06/15/2019  Medication Sig  . albuterol (VENTOLIN HFA) 108 (90 Base) MCG/ACT inhaler  Inhale 2 puffs into the lungs every 4 (four) hours as needed for wheezing or shortness of breath.  . budesonide-formoterol (SYMBICORT) 160-4.5 MCG/ACT inhaler Inhale 2 puffs into the lungs 2 (two) times daily.  . budesonide-formoterol (SYMBICORT) 160-4.5 MCG/ACT inhaler Inhale 2 puffs into the lungs 2 (two) times daily.  . cetirizine (ZYRTEC) 10 MG tablet Take 10 mg by mouth daily.  . clonazePAM (KLONOPIN) 1 MG tablet Take 1 tablet (1 mg total) by mouth 2 (two) times daily as needed for anxiety.  . montelukast (SINGULAIR) 10 MG tablet Take 1 tablet (10 mg total) by mouth at bedtime.  Marland Kitchen QUEtiapine (SEROQUEL) 50 MG tablet Take 1  tablet (50 mg total) by mouth at bedtime.   No facility-administered encounter medications on file as of 06/15/2019.     Review of Systems  Review of Systems   Physical Exam  There were no vitals taken for this visit.  Wt Readings from Last 5 Encounters:  11/02/18 146 lb 3.2 oz (66.3 kg)  03/22/18 139 lb 8 oz (63.3 kg)  03/17/16 180 lb (81.6 kg)  11/28/14 190 lb 12.8 oz (86.5 kg)  08/22/14 168 lb 12.8 oz (76.6 kg)    BMI Readings from Last 5 Encounters:  11/02/18 23.96 kg/m  03/22/18 22.86 kg/m  03/17/16 29.95 kg/m  11/28/14 31.27 kg/m  08/22/14 27.25 kg/m     Physical Exam    Assessment & Plan:   No problem-specific Assessment & Plan notes found for this encounter.    No follow-ups on file.   Lauraine Rinne, NP 06/15/2019   This appointment required *** minutes of patient care (this includes precharting, chart review, review of results, face-to-face care, etc.).

## 2019-06-16 ENCOUNTER — Other Ambulatory Visit: Payer: Self-pay

## 2019-06-16 ENCOUNTER — Ambulatory Visit (INDEPENDENT_AMBULATORY_CARE_PROVIDER_SITE_OTHER): Payer: Medicaid Other | Admitting: Primary Care

## 2019-06-16 ENCOUNTER — Encounter: Payer: Self-pay | Admitting: Primary Care

## 2019-06-16 ENCOUNTER — Telehealth: Payer: Self-pay | Admitting: Primary Care

## 2019-06-16 DIAGNOSIS — J4540 Moderate persistent asthma, uncomplicated: Secondary | ICD-10-CM | POA: Diagnosis not present

## 2019-06-16 MED ORDER — BUDESONIDE-FORMOTEROL FUMARATE 160-4.5 MCG/ACT IN AERO: 2.0000 | INHALATION_SPRAY | Freq: Two times a day (BID) | RESPIRATORY_TRACT | 6 refills | Status: AC

## 2019-06-16 MED ORDER — MONTELUKAST SODIUM 10 MG PO TABS: 10.0000 mg | ORAL_TABLET | Freq: Every day | ORAL | 11 refills | Status: AC

## 2019-06-16 MED ORDER — ALBUTEROL SULFATE HFA 108 (90 BASE) MCG/ACT IN AERS: 2.0000 | INHALATION_SPRAY | RESPIRATORY_TRACT | 6 refills | Status: DC | PRN

## 2019-06-16 NOTE — Telephone Encounter (Signed)
Yes I will make sure we re-scheduled for her PFTs and have her go to [facility] next week for [facility]. I will consider refer to allergy after labs come back. Thanks to both  Horris Latino can you order and schedule testing

## 2019-06-16 NOTE — Progress Notes (Signed)
@Patient  ID: [name], female    DOB: [birth year], 29 y.o.   MRN: [id number]  Chief Complaint  Patient presents with  . Follow-up    pt needs a refill on rescue inhaler    Referring provider: No ref. provider found  HPI: 29 year old female, former smoker quit in 2016 (2-pack-year history).  History significant for asthma, substance abuse, major depressive disorder, PTSD.  Patient of Dr. Vaughan Browner, last seen 11/02/2018 for new patient consult (former Dr. Melvyn Novas).  Started on Singulair and resumed Symbicort.  IgE >1,000.  Eosinophil absolute 100.  Pulmonary function tests are still active.   Previous [facility] pulmonary encounters: 11/02/18 - Dr, Vaughan Browner Consult  29 year old with history of childhood asthma, allergies and anxiety.  Previously followed by Dr. Pamella Pert with asthma at the age of 66.  Symptoms are worsening over the past 2 years with daily symptoms of dyspnea, nighttime awakenings.  She is using Symbicort intermittently and albuterol intermittently.  She is currently out of all inhalers.  She is been told that she is allergic to multiple environmental allergens and pets in the past  Pets: No pets Occupation: Health visitor Exposures: No known exposures.  No mold, hot tub, Jacuzzi.  No down pillows or comforters Smoking history: No tobacco use.  Smokes weed every day Travel history: Originally from [place].  No significant recent travel Relevant family history: No significant family history of lung disease  ACT score 11/02/2018-9  06/16/2019 Patient presents today for regular follow-up, she missed her visit yesterday with Wyn Quaker, NP. She has both of her daughters with her today. She states that her asthma symptoms are worse d/t seasonal allergies. She is using Symbicort 160 as prescribed twice a day. She is having to use her albuterol 8 times a day. States that it helps for a short period of times. She notices shortness of breath and chest tightness at random times throughout the day. She is  physically active. She is around people that smoke. Denies tobacco use, smells of marijuana.   PFTs: None on File  CXR: 11/02/18- The heart size and mediastinal contours are within normal limits. Both lungs are clear. The visualized skeletal structures are unremarkable.  IMPRESSION: No acute abnormality of the lungs.   No Known Allergies  Immunization History  Administered Date(s) Administered  . Influenza Split 03/01/2012, 11/02/2013  . Influenza Whole 11/03/2011  . Influenza,inj,Quad PF,6+ Mos 12/01/2012  . Pneumococcal Polysaccharide-23 07/02/2012  . Tdap 03/29/2012, 06/25/2014    Past Medical History:  Diagnosis Date  . Anemia 2010  . Anxiety   . Asthma   . Depression    h/o pp depression after 1st pregnancy  . GERD (gastroesophageal reflux disease)     Tobacco History: Social History   Tobacco Use  Smoking Status Former Smoker  . Packs/day: 0.25  . Years: 2.00  . Pack years: 0.50  . Types: Cigarettes  . Quit date: 04/14/2014  . Years since quitting: 5.1  Smokeless Tobacco Never Used   Counseling given: Not Answered   Outpatient Medications Prior to Visit  Medication Sig Dispense Refill  . cetirizine (ZYRTEC) 10 MG tablet Take 10 mg by mouth daily.    Marland Kitchen albuterol (VENTOLIN HFA) 108 (90 Base) MCG/ACT inhaler Inhale 2 puffs into the lungs every 4 (four) hours as needed for wheezing or shortness of breath. 18 g 0  . budesonide-formoterol (SYMBICORT) 160-4.5 MCG/ACT inhaler Inhale 2 puffs into the lungs 2 (two) times daily. 1 Inhaler 6  . budesonide-formoterol (SYMBICORT) 160-4.5 MCG/ACT  inhaler Inhale 2 puffs into the lungs 2 (two) times daily. 1 Inhaler 0  . montelukast (SINGULAIR) 10 MG tablet Take 1 tablet (10 mg total) by mouth at bedtime. 30 tablet 11  . clonazePAM (KLONOPIN) 1 MG tablet Take 1 tablet (1 mg total) by mouth 2 (two) times daily as needed for anxiety. 60 tablet 0  . QUEtiapine (SEROQUEL) 50 MG tablet Take 1 tablet (50 mg total) by mouth  at bedtime. 30 tablet 0   No facility-administered medications prior to visit.      Review of Systems  Review of Systems  Constitutional: Negative.   HENT: Positive for postnasal drip.   Respiratory: Positive for chest tightness and shortness of breath. Negative for wheezing.   Cardiovascular: Negative.    Physical Exam  BP 112/76 (BP Location: Left Arm, Cuff Size: Normal)   Pulse 88   Temp 98 F (36.7 C) (Temporal)   Ht 5\' 5"  (1.651 m)   Wt 143 lb 9.6 oz (65.1 kg)   SpO2 98%   BMI 23.90 kg/m  Physical Exam Constitutional:      Appearance: Normal appearance. She is not ill-appearing.  HENT:     Head: Normocephalic and atraumatic.  Cardiovascular:     Rate and Rhythm: Normal rate and regular rhythm.  Pulmonary:     Breath sounds: No wheezing.     Comments: Lungs clear Neurological:     General: No focal deficit present.     Mental Status: She is alert and oriented to person, place, and time. Mental status is at baseline.  Psychiatric:        Mood and Affect: Mood normal.        Behavior: Behavior normal.        Thought Content: Thought content normal.        Judgment: Judgment normal.      Lab Results:  CBC    Component Value Date/Time   WBC 3.0 (L) 11/02/2018 1235   RBC 4.98 11/02/2018 1235   HGB 14.1 11/02/2018 1235   HCT 43.3 11/02/2018 1235   PLT 268.0 11/02/2018 1235   MCV 87.0 11/02/2018 1235   MCH 27.6 03/22/2018 0845   MCHC 32.5 11/02/2018 1235   RDW 14.7 11/02/2018 1235   LYMPHSABS 1.5 11/02/2018 1235   MONOABS 0.1 11/02/2018 1235   EOSABS 0.1 11/02/2018 1235   BASOSABS 0.0 11/02/2018 1235    BMET    Component Value Date/Time   NA 140 03/22/2018 0845   K 3.6 03/22/2018 0845   CL 109 03/22/2018 0845   CO2 24 03/22/2018 0845   GLUCOSE 96 03/22/2018 0845   BUN 13 03/22/2018 0845   CREATININE 0.81 03/22/2018 0845   CREATININE 0.75 12/01/2012 0947   CALCIUM 9.0 03/22/2018 0845   GFRNONAA >60 03/22/2018 0845   GFRNONAA >89 12/01/2012  0947   GFRAA >60 03/22/2018 0845   GFRAA >89 12/01/2012 0947    BNP No results found for: BNP  ProBNP No results found for: PROBNP  Imaging: No results found.   Assessment & Plan:   Asthma - Asthma symptoms progressively worsened over the last severeal years. Triggers include season allergies. Frequent SABA use x 8/day  - IgE >1000, Normal Eosinophils  - Continue Symbicort 160 two puffs twice daily and prn Ventolin 2 puffs every 4-6 hours for breakthrough shortness of breath/wheezing  - Continues Singulair 10mg  at bedtime - Set follow-up appointment with pharmacy to discuss starting biologic medication Xolair      Ree Shay  Volanda Napoleon, NP 06/16/2019

## 2019-06-16 NOTE — Telephone Encounter (Signed)
Dr. Vaughan Browner,  I am going to set patient up with an apt with pharmacy to discuss starting Xolair for her moderate-severe persistent asthma. Uncontrolled on Symbicort 160. She is having to use SABA x8/day. IgE >1000, normal eosinophils   -[employer]

## 2019-06-16 NOTE — Telephone Encounter (Signed)
OK. Sounds good. Pharmacy can also review inhaler technique, compliance etc  Can you check a RAST panel as well and PFTs May need allergy eval

## 2019-06-16 NOTE — Telephone Encounter (Signed)
Patient was scheduled for 6/2.  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

## 2019-06-16 NOTE — Assessment & Plan Note (Signed)
-   Asthma symptoms progressively worsened over the last severeal years. Triggers include season allergies. Frequent SABA use x 8/day  - IgE >1000, Normal Eosinophils  - Continue Symbicort 160 two puffs twice daily and prn Ventolin 2 puffs every 4-6 hours for breakthrough shortness of breath/wheezing  - Continues Singulair 10mg  at bedtime - Set follow-up appointment with pharmacy to discuss starting biologic medication Xolair

## 2019-06-16 NOTE — Patient Instructions (Addendum)
Recommendation: - Continue Symbicort take 2 puffs twice daily in the morning and evening (rinse mouth after use) - Use Ventolin rescue inhaler 2 puffs every 4-6 hours as needed for breakthrough shortness of breath or wheezing - Continue Singulair 10 mg at bedtime  Rx: -Refills of Symbicort, Ventolin and Singulair sent to pharmacy on file  Follow-up - Set follow-up appointment with pharmacy to review Xolair as a biologic medication for your asthma - 6 months with Dr. Vaughan Browner or sooner if needed    Asthma, Adult  Asthma is a long-term (chronic) condition in which the airways get tight and narrow. The airways are the breathing passages that lead from the nose and mouth down into the lungs. A person with asthma will have times when symptoms get worse. These are called asthma attacks. They can cause coughing, whistling sounds when you breathe (wheezing), shortness of breath, and chest pain. They can make it hard to breathe. There is no cure for asthma, but medicines and lifestyle changes can help control it. There are many things that can bring on an asthma attack or make asthma symptoms worse (triggers). Common triggers include:  Mold.  Dust.  Cigarette smoke.  Cockroaches.  Things that can cause allergy symptoms (allergens). These include animal skin flakes (dander) and pollen from trees or grass.  Things that pollute the air. These may include household cleaners, wood smoke, smog, or chemical odors.  Cold air, weather changes, and wind.  Crying or laughing hard.  Stress.  Certain medicines or drugs.  Certain foods such as dried fruit, potato chips, and grape juice.  Infections, such as a cold or the flu.  Certain medical conditions or diseases.  Exercise or tiring activities. Asthma may be treated with medicines and by staying away from the things that cause asthma attacks. Types of medicines may include:  Controller medicines. These help prevent asthma symptoms. They are  usually taken every day.  Fast-acting reliever or rescue medicines. These quickly relieve asthma symptoms. They are used as needed and provide short-term relief.  Allergy medicines if your attacks are brought on by allergens.  Medicines to help control the body's defense (immune) system. Follow these instructions at home: Avoiding triggers in your home  Change your heating and air conditioning filter often.  Limit your use of fireplaces and wood stoves.  Get rid of pests (such as roaches and mice) and their droppings.  Throw away plants if you see mold on them.  Clean your floors. Dust regularly. Use cleaning products that do not smell.  Have someone vacuum when you are not home. Use a vacuum cleaner with a HEPA filter if possible.  Replace carpet with wood, tile, or vinyl flooring. Carpet can trap animal skin flakes and dust.  Use allergy-proof pillows, mattress covers, and box spring covers.  Wash bed sheets and blankets every week in hot water. Dry them in a dryer.  Keep your bedroom free of any triggers.  Avoid pets and keep windows closed when things that cause allergy symptoms are in the air.  Use blankets that are made of polyester or cotton.  Clean bathrooms and kitchens with bleach. If possible, have someone repaint the walls in these rooms with mold-resistant paint. Keep out of the rooms that are being cleaned and painted.  Wash your hands often with soap and water. If soap and water are not available, use hand sanitizer.  Do not allow anyone to smoke in your home. General instructions  Take over-the-counter and prescription medicines  only as told by your doctor. ? Talk with your doctor if you have questions about how or when to take your medicines. ? Make note if you need to use your medicines more often than usual.  Do not use any products that contain nicotine or tobacco, such as cigarettes and e-cigarettes. If you need help quitting, ask your doctor.  Stay  away from secondhand smoke.  Avoid doing things outdoors when allergen counts are high and when air quality is low.  Wear a ski mask when doing outdoor activities in the winter. The mask should cover your nose and mouth. Exercise indoors on cold days if you can.  Warm up before you exercise. Take time to cool down after exercise.  Use a peak flow meter as told by your doctor. A peak flow meter is a tool that measures how well the lungs are working.  Keep track of the peak flow meter's readings. Write them down.  Follow your asthma action plan. This is a written plan for taking care of your asthma and treating your attacks.  Make sure you get all the shots (vaccines) that your doctor recommends. Ask your doctor about a flu shot and a pneumonia shot.  Keep all follow-up visits as told by your doctor. This is important. Contact a doctor if:  You have wheezing, shortness of breath, or a cough even while taking medicine to prevent attacks.  The mucus you cough up (sputum) is thicker than usual.  The mucus you cough up changes from clear or white to yellow, green, gray, or bloody.  You have problems from the medicine you are taking, such as: ? A rash. ? Itching. ? Swelling. ? Trouble breathing.  You need reliever medicines more than 2-3 times a week.  Your peak flow reading is still at 50-79% of your personal best after following the action plan for 1 hour.  You have a fever. Get help right away if:  You seem to be worse and are not responding to medicine during an asthma attack.  You are short of breath even at rest.  You get short of breath when doing very little activity.  You have trouble eating, drinking, or talking.  You have chest pain or tightness.  You have a fast heartbeat.  Your lips or fingernails start to turn blue.  You are light-headed or dizzy, or you faint.  Your peak flow is less than 50% of your personal best.  You feel too tired to breathe  normally. Summary  Asthma is a long-term (chronic) condition in which the airways get tight and narrow. An asthma attack can make it hard to breathe.  Asthma cannot be cured, but medicines and lifestyle changes can help control it.  Make sure you understand how to avoid triggers and how and when to use your medicines. This information is not intended to replace advice given to you by your health care provider. Make sure you discuss any questions you have with your health care provider. Document Revised: 03/24/2018 Document Reviewed: 02/24/2016 Elsevier Patient Education  2020 [employer].

## 2019-07-04 ENCOUNTER — Telehealth: Payer: Self-pay | Admitting: Pharmacist

## 2019-07-04 DIAGNOSIS — J4540 Moderate persistent asthma, uncomplicated: Secondary | ICD-10-CM

## 2019-07-04 NOTE — Progress Notes (Deleted)
° °  HPI Patient presents today to [facility] to see pharmacy team for ***.  Past medical history includes ***  Number of hospitalizations in past year: Number of ***COPD/asthma exacerbations in past year:   Respiratory Medications Current: *** Tried in past: *** Patient reports {Adherence challenges yes no:[id number]::"adherence challenges","no known adherence challenges"}  OBJECTIVE No Known Allergies  Outpatient Encounter Medications as of 07/05/2019  Medication Sig   albuterol (VENTOLIN HFA) 108 (90 Base) MCG/ACT inhaler Inhale 2 puffs into the lungs every 4 (four) hours as needed for wheezing or shortness of breath.   budesonide-formoterol (SYMBICORT) 160-4.5 MCG/ACT inhaler Inhale 2 puffs into the lungs 2 (two) times daily.   cetirizine (ZYRTEC) 10 MG tablet Take 10 mg by mouth daily.   clonazePAM (KLONOPIN) 1 MG tablet Take 1 tablet (1 mg total) by mouth 2 (two) times daily as needed for anxiety.   montelukast (SINGULAIR) 10 MG tablet Take 1 tablet (10 mg total) by mouth at bedtime.   QUEtiapine (SEROQUEL) 50 MG tablet Take 1 tablet (50 mg total) by mouth at bedtime.   No facility-administered encounter medications on file as of 07/05/2019.     Immunization History  Administered Date(s) Administered   Influenza Split 03/01/2012, 11/02/2013   Influenza Whole 11/03/2011   Influenza,inj,Quad PF,6+ Mos 12/01/2012   Pneumococcal Polysaccharide-23 07/02/2012   Tdap 03/29/2012, 06/25/2014     PFTs No flowsheet data found.   Eosinophils Most recent blood eosinophil count was *** cells/microL taken on ***.   IgE   Assessment   1. Biologics training (***)  Omalizumab (Xolair) o MOA: IgG monoclonal antibody (recombinant DNA derived) which inhibits IgE binding to the high-affinity IgE receptor on mast cells and basophils.  o Response to therapy: ??? o Side effects: Anaphylaxis (0.1%) (black boxed warning), injection site reaction (45%), arthralgia (2.9% to 8%),  headache (3% to 15%) o Dosing: SubQ every 2-4 weeks based on weight and pretreatment serum IgE o Administration:  1. Doses >150 mg should be divided over more than one injection site (eg, 225 mg or 300 mg administered as two injections, 375 mg administered as three injections) 2.  Do not inject into moles, scars, bruises, tender areas, or broken skin. 3. Injections may take 5 to 10 seconds to administer (solution is slightly viscous). 4. Administer only under direct medical supervision and observe patient for 2 hours after the first 3 injections and 30 minutes after subsequent injections or in accordance with individual institution policies and procedures 5. Recommended injection sites include the upper arm and the front and middle of the thighs.  2. Medication Reconciliation  A drug regimen assessment was performed, including review of allergies, interactions, disease-state management, dosing and immunization history. Medications were reviewed with the patient, including name, instructions, indication, goals of therapy, potential side effects, importance of adherence, and safe use.  Drug interaction(s): ***  3. Immunizations  Patient is indicated for the influenzae, pneumonia, and shingles vaccinations.  PLAN ***  All questions encouraged and answered.  Instructed patient to reach out with any further questions or concerns.  Thank you for allowing pharmacy to participate in this patient's care.  This appointment required *** minutes of patient care (this includes precharting, chart review, review of results, face-to-face care, etc.).

## 2019-07-04 NOTE — Telephone Encounter (Signed)
Please start benefits investigation for Xolair for asthma. She is on Singulair and resumed Symbicort.  IgE >1,000.  Eosinophil absolute 100.    Mariella Saa, PharmD, [place], CPP Clinical Specialty Pharmacist (Rheumatology and Pulmonology)  07/04/2019 3:28 PM

## 2019-07-05 ENCOUNTER — Other Ambulatory Visit: Payer: Medicaid Other

## 2019-07-05 NOTE — Telephone Encounter (Signed)
Patient no showed pharmacy appointment for [facility]. For the prior authorization, plan requires patient to have a RAST test in the past 12 months. Do we know if patient was set up for this test at the [place] office?  Thanks! Beatriz Chancellor, CPhT

## 2019-07-05 NOTE — Telephone Encounter (Signed)
Started PA on [name], last question is if patient has had :A percutaneous skin test or RAST allergy test in the past twelve months indicating reactivity to at least one perennial aeroallergen.  Per notes, it looks like patient was due to get scheduled for a RAST test.  Do we know if patient has had her test?

## 2019-07-10 NOTE — Telephone Encounter (Signed)
Called pt and advised message from the provider. Pt understood and verbalized understanding. Nothing further is needed.   RAST panel ordered, pt will make an appt when she comes in to get her blood drawn. FYI Beth

## 2019-07-10 NOTE — Telephone Encounter (Signed)
I do not think she has had one in the last 12 months  Bonnie or Triage can we please order one and have her complete this >?

## 2019-07-11 DIAGNOSIS — R102 Pelvic and perineal pain: Secondary | ICD-10-CM | POA: Diagnosis not present

## 2019-07-11 DIAGNOSIS — R1030 Lower abdominal pain, unspecified: Secondary | ICD-10-CM | POA: Diagnosis not present

## 2019-07-11 DIAGNOSIS — N9489 Other specified conditions associated with female genital organs and menstrual cycle: Secondary | ICD-10-CM | POA: Diagnosis not present

## 2019-07-11 DIAGNOSIS — N83202 Unspecified ovarian cyst, left side: Secondary | ICD-10-CM | POA: Diagnosis not present

## 2019-07-11 DIAGNOSIS — R1032 Left lower quadrant pain: Secondary | ICD-10-CM | POA: Diagnosis not present

## 2019-07-11 DIAGNOSIS — O26891 Other specified pregnancy related conditions, first trimester: Secondary | ICD-10-CM | POA: Diagnosis not present

## 2019-07-11 DIAGNOSIS — R1084 Generalized abdominal pain: Secondary | ICD-10-CM | POA: Diagnosis not present

## 2019-07-11 DIAGNOSIS — D259 Leiomyoma of uterus, unspecified: Secondary | ICD-10-CM | POA: Diagnosis not present

## 2019-07-11 DIAGNOSIS — R58 Hemorrhage, not elsewhere classified: Secondary | ICD-10-CM | POA: Diagnosis not present

## 2019-07-12 DIAGNOSIS — D251 Intramural leiomyoma of uterus: Secondary | ICD-10-CM | POA: Insufficient documentation

## 2019-07-12 NOTE — Telephone Encounter (Signed)
Thanks

## 2019-08-01 ENCOUNTER — Other Ambulatory Visit (HOSPITAL_COMMUNITY): Payer: Self-pay | Admitting: Psychiatry

## 2019-08-01 ENCOUNTER — Other Ambulatory Visit: Payer: Self-pay

## 2019-08-01 ENCOUNTER — Telehealth (INDEPENDENT_AMBULATORY_CARE_PROVIDER_SITE_OTHER): Payer: Medicaid Other | Admitting: Psychiatry

## 2019-08-01 DIAGNOSIS — F39 Unspecified mood [affective] disorder: Secondary | ICD-10-CM

## 2019-08-01 DIAGNOSIS — F410 Panic disorder [episodic paroxysmal anxiety] without agoraphobia: Secondary | ICD-10-CM | POA: Diagnosis not present

## 2019-08-01 MED ORDER — OXCARBAZEPINE 300 MG PO TABS: ORAL_TABLET | ORAL | 0 refills | Status: DC

## 2019-08-01 MED ORDER — CLONAZEPAM 1 MG PO TABS: 1.0000 mg | ORAL_TABLET | Freq: Three times a day (TID) | ORAL | 0 refills | Status: DC | PRN
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
## 2019-08-16 NOTE — Telephone Encounter (Signed)
Still awaiting patient's RAST results. Orders are still pending.

## 2019-08-16 NOTE — Telephone Encounter (Signed)
I called her and she will come in this week or next to have drawn

## 2019-08-17 ENCOUNTER — Encounter (HOSPITAL_COMMUNITY): Payer: Self-pay | Admitting: Psychiatry

## 2019-08-17 ENCOUNTER — Other Ambulatory Visit: Payer: Self-pay

## 2019-08-17 ENCOUNTER — Ambulatory Visit (INDEPENDENT_AMBULATORY_CARE_PROVIDER_SITE_OTHER): Payer: Medicaid Other | Admitting: Psychiatry

## 2019-08-17 DIAGNOSIS — F4310 Post-traumatic stress disorder, unspecified: Secondary | ICD-10-CM | POA: Diagnosis not present

## 2019-08-17 NOTE — Progress Notes (Signed)
Virtual Visit via Video Note  I connected with [name] on 08/17/19 at  2:30 PM EDT by a video enabled telemedicine application and verified that I am speaking with the correct person using two identifiers.  Location: Patient: Patient Home Provider: Home Office   I discussed the limitations of evaluation and management by telemedicine and the availability of in person appointments. The patient expressed understanding and agreed to proceed.  History of Present Illness: PTSD   Treatment Plan Goals: 1) Pt to attend counseling biweekly to assist coping w/ anxiety and anger.  2) Pt to use CBT skills learned in session to address depression/grief and loss symptoms/responses.  Observations/Objective: Counselor met with Client for individual therapy via Webex. Counselor assessed MH symptoms and progress on past treatment plan goals, with patient reporting increase in anger, anxiety and depressive symptoms since recent abortion and loss of intimate partners. Client presents with moderate depression and moderate anxiety, with trauma triggers/responses of anger. Client denied suicidal ideation or self-harm behaviors.   Counselor and Client worked to Tax inspector and treatment plan goals (see above and attached). Client shared about history of treatment, recent/current life stressors, current symptoms, and hopes for treatment. Counselor assessed for safety and daily functioning. Client desires to meet weekly, Counselor to set upcoming appointments and to notify Client of dates/times. Counselor to provide Client with community resources for [employer], [employer] and [employer].   Assessment and Plan: Counselor will continue to meet with patient to address treatment plan goals. Patient will continue to follow recommendations of providers and implement skills learned in session.  Follow Up Instructions: Counselor will send information for next session via Webex.    The patient was  advised to call back or seek an in-person evaluation if the symptoms worsen or if the condition fails to improve as anticipated.  I provided 55 minutes of non-face-to-face time during this encounter.   Lise Auer, LCSW

## 2019-08-22 ENCOUNTER — Other Ambulatory Visit: Payer: Self-pay

## 2019-08-22 ENCOUNTER — Telehealth (INDEPENDENT_AMBULATORY_CARE_PROVIDER_SITE_OTHER): Payer: Medicaid Other | Admitting: Psychiatry

## 2019-08-22 DIAGNOSIS — F4310 Post-traumatic stress disorder, unspecified: Secondary | ICD-10-CM | POA: Diagnosis not present

## 2019-08-22 DIAGNOSIS — F410 Panic disorder [episodic paroxysmal anxiety] without agoraphobia: Secondary | ICD-10-CM | POA: Diagnosis not present

## 2019-08-22 DIAGNOSIS — F330 Major depressive disorder, recurrent, mild: Secondary | ICD-10-CM | POA: Diagnosis not present

## 2019-08-22 MED ORDER — CLONAZEPAM 1 MG PO TABS: 1.0000 mg | ORAL_TABLET | Freq: Two times a day (BID) | ORAL | 1 refills | Status: DC | PRN

## 2019-08-22 MED ORDER — SERTRALINE HCL 50 MG PO TABS
50.0000 mg | ORAL_TABLET | Freq: Every day | ORAL | 0 refills | Status: DC
Start: 2019-08-22 — End: 2019-10-17
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
## 2019-08-31 ENCOUNTER — Telehealth: Payer: Self-pay | Admitting: Primary Care

## 2019-08-31 ENCOUNTER — Telehealth (HOSPITAL_COMMUNITY): Payer: Medicaid Other | Admitting: Psychiatry

## 2019-08-31 ENCOUNTER — Other Ambulatory Visit: Payer: Self-pay | Admitting: Primary Care

## 2019-08-31 MED ORDER — ALBUTEROL SULFATE HFA 108 (90 BASE) MCG/ACT IN AERS: 2.0000 | INHALATION_SPRAY | RESPIRATORY_TRACT | 6 refills | Status: DC | PRN

## 2019-08-31 NOTE — Telephone Encounter (Signed)
Called and spoke with pt to verify preferred pharmacy. Rx for pt's albuterol inhaler has been sent to preferred pharmacy for pt. Nothing further needed.

## 2019-09-20 ENCOUNTER — Telehealth (HOSPITAL_COMMUNITY): Payer: Medicaid Other | Admitting: Psychiatry

## 2019-09-20 ENCOUNTER — Other Ambulatory Visit: Payer: Self-pay

## 2019-09-25 ENCOUNTER — Other Ambulatory Visit: Payer: Self-pay

## 2019-09-25 ENCOUNTER — Ambulatory Visit (INDEPENDENT_AMBULATORY_CARE_PROVIDER_SITE_OTHER): Payer: Medicaid Other | Admitting: Psychiatry

## 2019-09-25 DIAGNOSIS — F410 Panic disorder [episodic paroxysmal anxiety] without agoraphobia: Secondary | ICD-10-CM | POA: Diagnosis not present

## 2019-09-25 DIAGNOSIS — F4310 Post-traumatic stress disorder, unspecified: Secondary | ICD-10-CM

## 2019-09-25 NOTE — Progress Notes (Signed)
Virtual Visit via Video Note  I connected with [name] on 09/25/19 at  3:30 PM EDT by a video enabled telemedicine application and verified that I am speaking with the correct person using two identifiers.  Location: Patient: Patient Home Provider: Home Office   I discussed the limitations of evaluation and management by telemedicine and the availability of in person appointments. The patient expressed understanding and agreed to proceed.  History of Present Illness: PTSD  Treatment Plan Goals: 1) Pt to attend counseling biweekly to assist coping w/ anxiety and anger.  2) Pt to use CBT skills learned in session to address depression/grief and loss symptoms/responses.  Observations/Objective: Counselor met with Client for individual therapy via Webex. Counselor assessed MH symptoms and progress on treatment plan goals, with patient reporting increased agitation, irritability, anger and anxiety, in particular connection with her role as mother to her two children who reside with her. Client presents with moderate depression and moderate anxiety. Client denied suicidal ideation or self-harm behaviors.   In addressing goal 1, Client shared that she was "annoyed" at having to complete the tasks involved in getting her 31 yo to and from school, as well as being home with her 51 yo daughter 24/7. Counselor assessed common issues and frustrations, with Client reporting that they are constantly wanting to be with her in her room, asking for snacks or drinks, arguing, whinning, and crying. Client expressed that she wished to be free of the responsibilities, so she can live her life, travel, have fun and not be "tied down by them". Counselor processed impact of parenting on her life and functioning, with client noting that they make her irritable, angry, mad, needing a break and time to herself to do what she enjoys. Counselor discussed community resources and programs the Client could engage in to  help with parenting skills, childcare and developmental support. Client mildly interested in initiating services. Counselor explored support system, with Client noting limited support from family and uncertain if the children's father is stable enough to take own more responsibility. Counselor and Client discussed involvement of CPS/Foster Care System as an option. Client stated that she has thought about that before, but would need more information. Counselor and Clinician discussed potential for future children, methods of birth control and her long-term life goals. In closing the session, Counselor to send community recourses for Client to look over for parenting support and legal aid services to initiate divorce process-seek custody agreement.   Assessment and Plan: Counselor will continue to meet with patient to address treatment plan goals. Patient will continue to follow recommendations of providers and implement skills learned in session.  Follow Up Instructions: Counselor will send information for next session via Webex.    The patient was advised to call back or seek an in-person evaluation if the symptoms worsen or if the condition fails to improve as anticipated.  I provided 55 minutes of non-face-to-face time during this encounter.   Lise Auer, LCSW

## 2019-10-03 ENCOUNTER — Encounter (HOSPITAL_COMMUNITY): Payer: Self-pay | Admitting: Psychiatry

## 2019-10-05 ENCOUNTER — Other Ambulatory Visit: Payer: Self-pay

## 2019-10-05 ENCOUNTER — Ambulatory Visit (INDEPENDENT_AMBULATORY_CARE_PROVIDER_SITE_OTHER): Payer: Medicaid Other | Admitting: Psychiatry

## 2019-10-05 DIAGNOSIS — F330 Major depressive disorder, recurrent, mild: Secondary | ICD-10-CM

## 2019-10-05 DIAGNOSIS — F4310 Post-traumatic stress disorder, unspecified: Secondary | ICD-10-CM

## 2019-10-12 ENCOUNTER — Encounter (HOSPITAL_COMMUNITY): Payer: Self-pay | Admitting: Psychiatry

## 2019-10-12 NOTE — Progress Notes (Signed)
Virtual Visit via Video Note  I connected with [name] on 10/12/19 at  3:30 PM EDT by a video enabled telemedicine application and verified that I am speaking with the correct person using two identifiers.  Location: Patient: Patient Home Provider: Home Office  I discussed the limitations of evaluation and management by telemedicine and the availability of in person appointments. The patient expressed understanding and agreed to proceed.  History of Present Illness: PTSD  Treatment Plan Goals: 1) Pt to attend counseling biweekly to assist coping w/ anxiety and anger.  2) Pt to use CBT skills learned in session to address depression/grief and loss symptoms/responses.  Observations/Objective: Counselor met with Client for individual therapy via Webex. 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 Client noted that she is no sad, she is irritable. Client presents with moderate depression and moderate anxiety. Client denied suicidal ideation or self-harm behaviors.   Counselor assessed progress in Goal 2, Counselor and Client explored historical impacts of current challenges in parenting. Counselor assessed her parent-child relationships with her parents, identifying trust issues, abandonment, absenteeism and lack of connection. Counselor prompted Client to discuss her connection with her children, stating that her oldest is being raised by her father, rarely interacting or seeing her, her middle and youngest are in her care primarily, causing frustration, annoyance and inconvenience. Client discussed a need for their father to "step up and do more". Client stated her 31 year old is "perfect" and her 31 yo "gets on my nerves." Counselor once again reviewed and made client aware of the various resources in the community to  establish services for her family, the children and to provide affordable childcare to give her a break. Client stated she would "look into it."  Finally, in relation to Goal 2, we briefly discussed issues surrounding flashbacks related to her recent abortion and the medical issues/treatment that followed. Client notes being triggered by seeing blood or hospital settings on television. Counselor provided psychoeducation about trauma and we plan to discuss more in future sessions.  Counselor shared grounding techniques to utilize when triggered in the future.    Assessment and Plan: Counselor will continue to meet with patient to address treatment plan goals. Patient will continue to follow recommendations of providers and implement skills learned in session.  Follow Up Instructions: Counselor will send information for next session via Webex.   The patient was advised to call back or seek an in-person evaluation if the symptoms worsen or if the condition fails to improve as anticipated.  I provided 55 minutes of non-face-to-face time during this encounter.   Lise Auer, LCSW

## 2019-10-16 ENCOUNTER — Other Ambulatory Visit: Payer: Self-pay

## 2019-10-16 ENCOUNTER — Encounter (HOSPITAL_COMMUNITY): Payer: Self-pay | Admitting: Psychiatry

## 2019-10-16 ENCOUNTER — Ambulatory Visit (INDEPENDENT_AMBULATORY_CARE_PROVIDER_SITE_OTHER): Payer: Medicaid Other | Admitting: Psychiatry

## 2019-10-16 DIAGNOSIS — F330 Major depressive disorder, recurrent, mild: Secondary | ICD-10-CM

## 2019-10-16 DIAGNOSIS — F4310 Post-traumatic stress disorder, unspecified: Secondary | ICD-10-CM

## 2019-10-16 NOTE — Progress Notes (Signed)
Virtual Visit via Video Note  I connected with [name] on 10/16/19 at  3:30 PM EDT by a video enabled telemedicine application and verified that I am speaking with the correct person using two identifiers.  Location: Patient: Patient Home Provider: Home Office   I discussed the limitations of evaluation and management by telemedicine and the availability of in person appointments. The patient expressed understanding and agreed to proceed.  History of Present Illness: PTSD  Treatment Plan Goals: 1) Pt to attend counseling biweekly to assist coping w/ anxiety and anger.  2) Pt to use CBT skills learned in session to address depression/grief and loss symptoms/responses.  Observations/Objective: Counselor met with Client for individual therapy via Webex. Counselor assessed MH symptoms and progress on treatment plan goals, with patient reporting that she is tired, irritable, frustrated, on edge and can't get her thoughts to stop, impacting her daily functioning and her ability to parent her children effectively. Client presents with moderate to severe depression and moderate anxiety. Client denied suicidal ideation or self-harm behaviors. However, Client discussed thinking about the need for inpatient treatment to get medication more stable and effective. Client does not belief what she is currently taking is addressing ongoing irritable mood. Counselor directed Client to contact her dr and the triage line to discuss options. Counselor provided psychoeducation about accessing care, least restrictive treatments and her fears/concerns about going inpatient. Client plans to call provider immediately following end of session to discuss. Counselor to contact dr as well to make aware of need.    Counselor assessed progress on Goal 1, with Client becoming more comfortable and open in sessions-discussing root of anxiety and anger, linked with relational issues and problems from childhood and currently.  Counselor used CBT interventions to process thoughts, feelings and behaviors with Client. Client reports no efforts to look into or utilize community resources the Counselor has provided for the Client to contact. Client indicated that she is not comfortable with utilizing such services, while also admitting she does not know that to do on her own.  Goal 2- Counselor additionally used CBT interventions to process loss of freedoms and identity with Client, which is magnifying her depression. Counselor and Client explored how to utilize supports to incorporate more freedom and expressions of self in her daily life.  Assessment and Plan: Counselor will continue to meet with patient to address treatment plan goals. Patient will continue to follow recommendations of providers and implement skills learned in session.  Follow Up Instructions: Counselor will send information for next session via Webex.    The patient was advised to call back or seek an in-person evaluation if the symptoms worsen or if the condition fails to improve as anticipated.  I provided 59 minutes of non-face-to-face time during this encounter.   Lise Auer, LCSW

## 2019-10-17 ENCOUNTER — Ambulatory Visit (INDEPENDENT_AMBULATORY_CARE_PROVIDER_SITE_OTHER): Payer: Medicaid Other | Admitting: Psychiatry

## 2019-10-17 ENCOUNTER — Other Ambulatory Visit: Payer: Self-pay

## 2019-10-17 DIAGNOSIS — F410 Panic disorder [episodic paroxysmal anxiety] without agoraphobia: Secondary | ICD-10-CM

## 2019-10-17 DIAGNOSIS — F331 Major depressive disorder, recurrent, moderate: Secondary | ICD-10-CM | POA: Diagnosis not present

## 2019-10-17 MED ORDER — ALPRAZOLAM 0.5 MG PO TABS: 0.5000 mg | ORAL_TABLET | Freq: Three times a day (TID) | ORAL | 1 refills | Status: DC | PRN

## 2019-10-17 MED ORDER — SERTRALINE HCL 50 MG PO TABS: 50.0000 mg | ORAL_TABLET | Freq: Every day | ORAL | 2 refills | Status: DC
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
## 2019-10-25 ENCOUNTER — Telehealth: Payer: Self-pay | Admitting: Primary Care

## 2019-10-25 MED ORDER — ALBUTEROL SULFATE HFA 108 (90 BASE) MCG/ACT IN AERS: 2.0000 | INHALATION_SPRAY | RESPIRATORY_TRACT | 6 refills | Status: DC | PRN

## 2019-10-25 NOTE — Telephone Encounter (Signed)
ATC patient.  Left message for patient advising that refills for albuterol inhaler have been sent to the pharmacy she specified.  Advised to call with any questions.

## 2019-11-09 ENCOUNTER — Ambulatory Visit (HOSPITAL_COMMUNITY): Payer: Medicaid Other | Admitting: Psychiatry

## 2019-11-09 ENCOUNTER — Encounter (HOSPITAL_COMMUNITY): Payer: Self-pay | Admitting: Psychiatry

## 2019-11-09 ENCOUNTER — Other Ambulatory Visit: Payer: Self-pay

## 2019-11-09 DIAGNOSIS — F331 Major depressive disorder, recurrent, moderate: Secondary | ICD-10-CM

## 2019-11-09 NOTE — Progress Notes (Signed)
Counselor called client at scheduled time. Client stated that she thought she had an appointment with the psychiatrist today, but regardless, now was not a good time to talk. We worked to reschedule appointment for later date and she stated she had no urgent issues at this time.

## 2019-11-17 ENCOUNTER — Telehealth (INDEPENDENT_AMBULATORY_CARE_PROVIDER_SITE_OTHER): Payer: Medicaid Other | Admitting: Psychiatry

## 2019-11-17 ENCOUNTER — Other Ambulatory Visit: Payer: Self-pay

## 2019-11-17 DIAGNOSIS — F410 Panic disorder [episodic paroxysmal anxiety] without agoraphobia: Secondary | ICD-10-CM | POA: Diagnosis not present

## 2019-11-17 DIAGNOSIS — F331 Major depressive disorder, recurrent, moderate: Secondary | ICD-10-CM

## 2019-11-17 DIAGNOSIS — F4310 Post-traumatic stress disorder, unspecified: Secondary | ICD-10-CM

## 2019-11-17 MED ORDER — ALPRAZOLAM 0.5 MG PO TABS: 0.5000 mg | ORAL_TABLET | Freq: Three times a day (TID) | ORAL | 2 refills | Status: DC | PRN
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
## 2019-11-21 ENCOUNTER — Ambulatory Visit (HOSPITAL_COMMUNITY): Payer: Medicaid Other | Admitting: Psychiatry

## 2019-11-22 ENCOUNTER — Ambulatory Visit (HOSPITAL_COMMUNITY): Payer: Medicaid Other | Admitting: Psychiatry

## 2019-11-22 ENCOUNTER — Other Ambulatory Visit: Payer: Self-pay

## 2019-11-22 ENCOUNTER — Ambulatory Visit (INDEPENDENT_AMBULATORY_CARE_PROVIDER_SITE_OTHER): Payer: Medicaid Other | Admitting: Psychiatry

## 2019-11-22 DIAGNOSIS — F4310 Post-traumatic stress disorder, unspecified: Secondary | ICD-10-CM

## 2019-11-22 DIAGNOSIS — F331 Major depressive disorder, recurrent, moderate: Secondary | ICD-10-CM | POA: Diagnosis not present

## 2019-11-23 ENCOUNTER — Encounter (HOSPITAL_COMMUNITY): Payer: Self-pay | Admitting: Psychiatry

## 2019-11-23 IMAGING — DX DG CHEST 2V
2 series · 2 of 2 positions shown · non-contrast
Comparison: 07/08/2015

CLINICAL DATA: Asthma

EXAM:
CHEST - 2 VIEW

[chest pa]
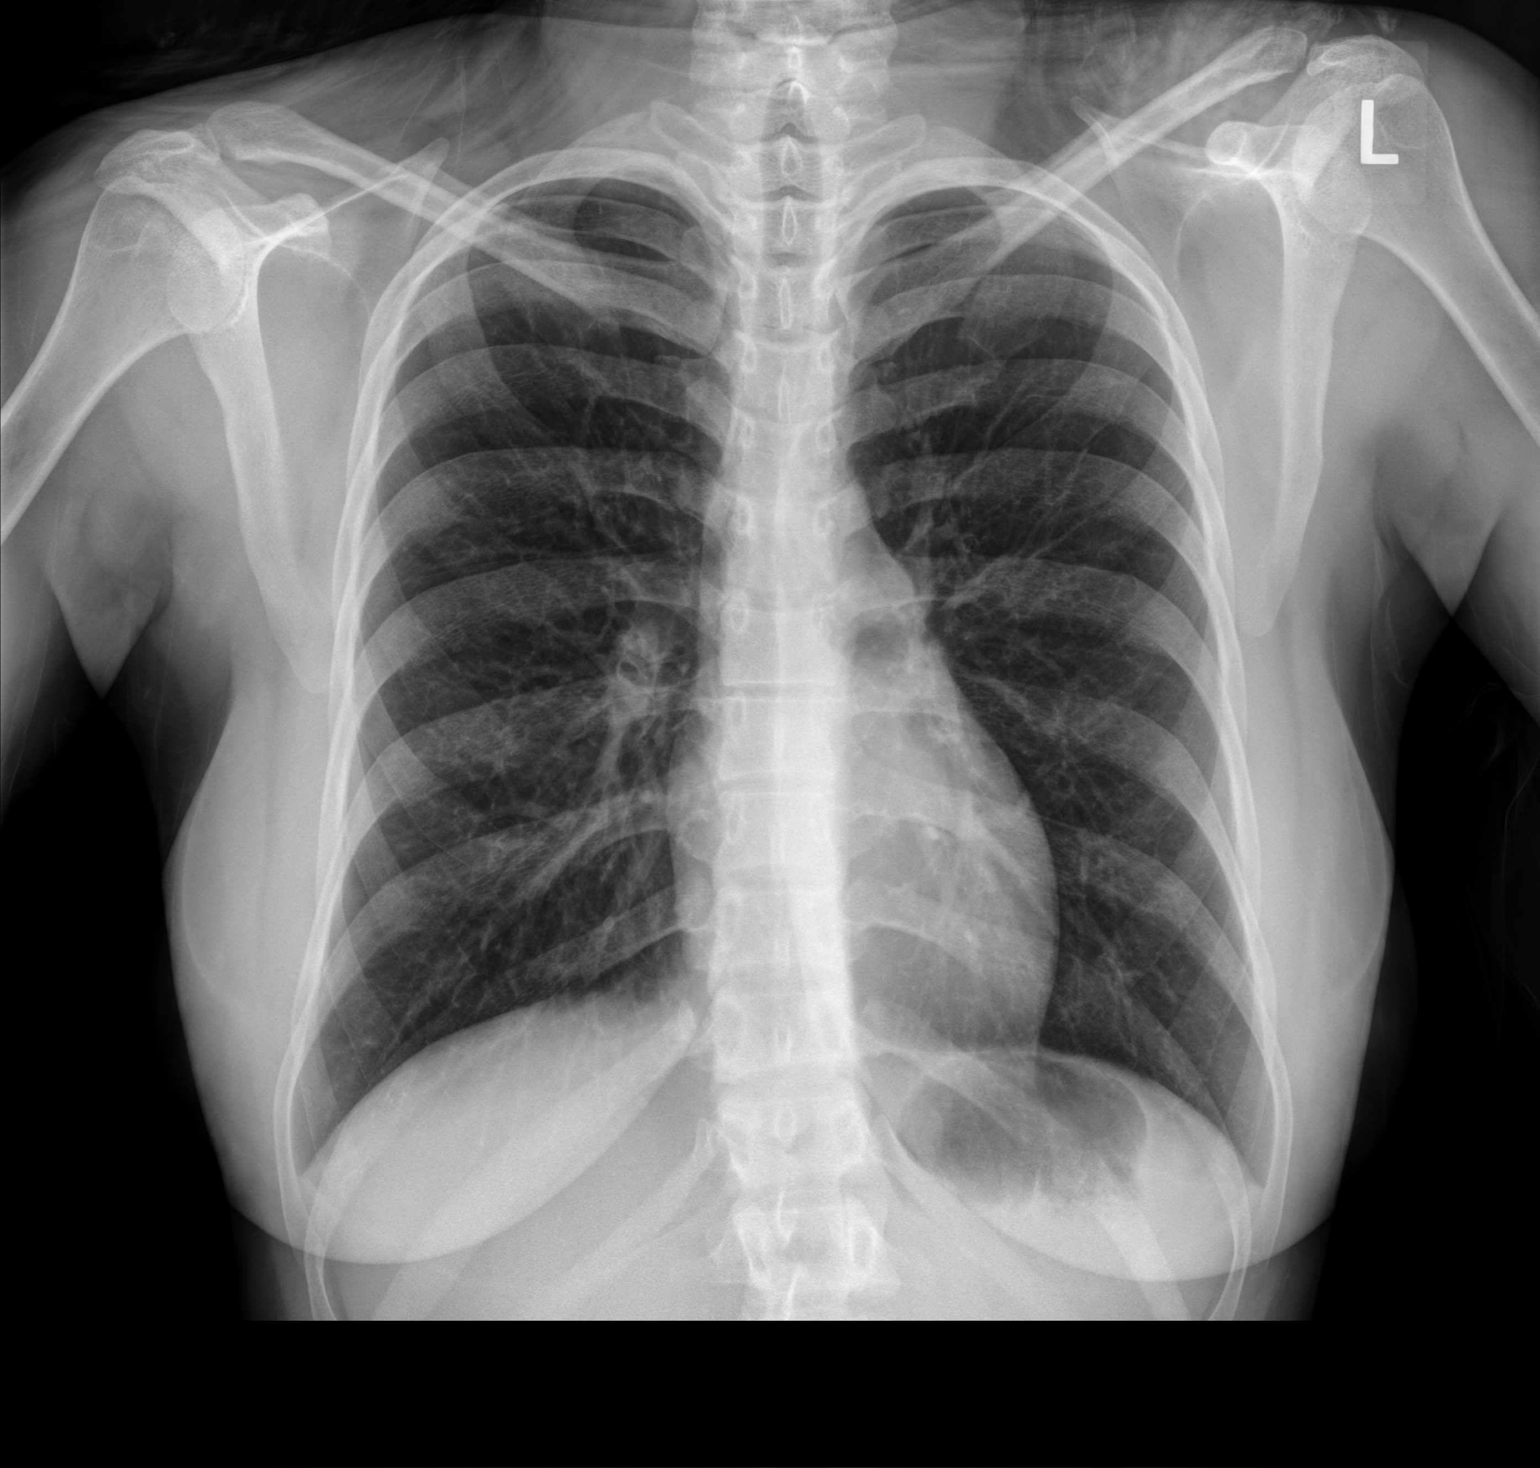

[chest lat]
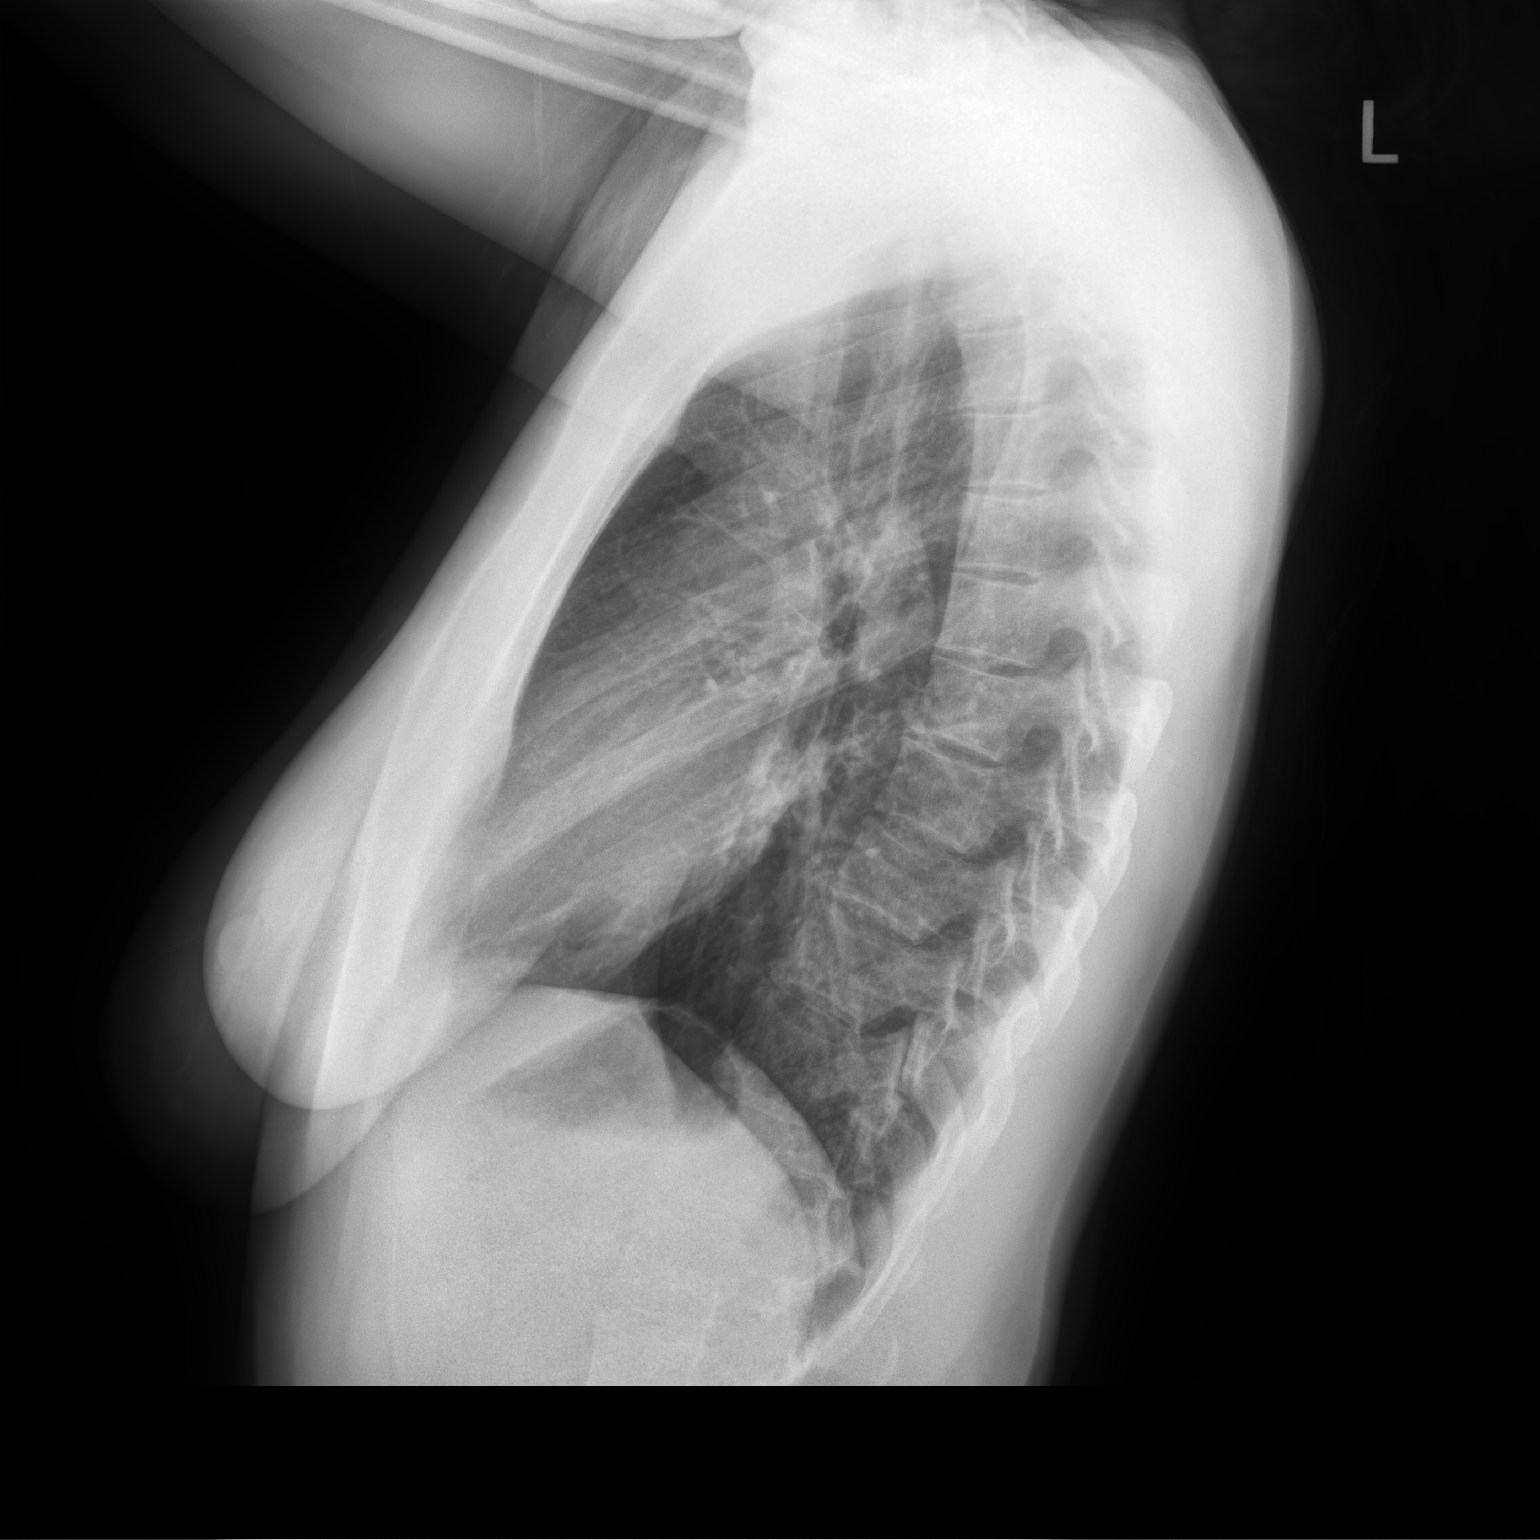

[2 of 2 positions shown; findings below may reference images not displayed]

FINDINGS: The heart size and mediastinal contours are within normal limits.
Both lungs are clear. The visualized skeletal structures are
unremarkable.
IMPRESSION: No acute abnormality of the lungs.
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
## 2019-11-30 ENCOUNTER — Ambulatory Visit (HOSPITAL_COMMUNITY): Payer: Medicaid Other | Admitting: Psychiatry

## 2019-12-12 ENCOUNTER — Other Ambulatory Visit: Payer: Self-pay

## 2019-12-12 ENCOUNTER — Ambulatory Visit (INDEPENDENT_AMBULATORY_CARE_PROVIDER_SITE_OTHER): Payer: Medicaid Other | Admitting: Psychiatry

## 2019-12-12 DIAGNOSIS — F331 Major depressive disorder, recurrent, moderate: Secondary | ICD-10-CM

## 2019-12-12 DIAGNOSIS — F4310 Post-traumatic stress disorder, unspecified: Secondary | ICD-10-CM | POA: Diagnosis not present
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
## 2019-12-15 ENCOUNTER — Encounter (HOSPITAL_COMMUNITY): Payer: Self-pay | Admitting: Psychiatry

## 2019-12-15 DIAGNOSIS — J4530 Mild persistent asthma, uncomplicated: Secondary | ICD-10-CM | POA: Diagnosis not present

## 2019-12-18 ENCOUNTER — Other Ambulatory Visit (HOSPITAL_COMMUNITY): Payer: Self-pay | Admitting: Psychiatry

## 2019-12-25 DIAGNOSIS — Z7951 Long term (current) use of inhaled steroids: Secondary | ICD-10-CM | POA: Diagnosis not present

## 2019-12-25 DIAGNOSIS — F419 Anxiety disorder, unspecified: Secondary | ICD-10-CM | POA: Diagnosis not present

## 2019-12-25 DIAGNOSIS — R519 Headache, unspecified: Secondary | ICD-10-CM | POA: Diagnosis not present

## 2019-12-25 DIAGNOSIS — R059 Cough, unspecified: Secondary | ICD-10-CM | POA: Diagnosis not present

## 2019-12-25 DIAGNOSIS — M791 Myalgia, unspecified site: Secondary | ICD-10-CM | POA: Diagnosis not present

## 2019-12-25 DIAGNOSIS — Z79899 Other long term (current) drug therapy: Secondary | ICD-10-CM | POA: Diagnosis not present

## 2019-12-25 DIAGNOSIS — U071 COVID-19: Secondary | ICD-10-CM | POA: Diagnosis not present

## 2019-12-25 DIAGNOSIS — J45909 Unspecified asthma, uncomplicated: Secondary | ICD-10-CM | POA: Diagnosis not present

## 2019-12-26 ENCOUNTER — Telehealth: Payer: Self-pay

## 2019-12-26 NOTE — Telephone Encounter (Signed)
Transition Care Management Unsuccessful Follow-up Telephone Call  Date of discharge and from where: 12/25/2019 [facility]  Attempts:  1st Attempt  Reason for unsuccessful TCM follow-up call:  Left voice message

## 2020-01-01 ENCOUNTER — Telehealth (INDEPENDENT_AMBULATORY_CARE_PROVIDER_SITE_OTHER): Payer: Medicaid Other | Admitting: Psychiatry

## 2020-01-01 ENCOUNTER — Other Ambulatory Visit: Payer: Self-pay

## 2020-01-01 DIAGNOSIS — F330 Major depressive disorder, recurrent, mild: Secondary | ICD-10-CM

## 2020-01-01 DIAGNOSIS — F410 Panic disorder [episodic paroxysmal anxiety] without agoraphobia: Secondary | ICD-10-CM

## 2020-01-01 DIAGNOSIS — F4310 Post-traumatic stress disorder, unspecified: Secondary | ICD-10-CM | POA: Diagnosis not present

## 2020-01-01 MED ORDER — SERTRALINE HCL 50 MG PO TABS: 50.0000 mg | ORAL_TABLET | Freq: Every day | ORAL | 2 refills | Status: DC
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
## 2020-01-04 ENCOUNTER — Ambulatory Visit (HOSPITAL_COMMUNITY): Payer: Medicaid Other | Admitting: Psychiatry

## 2020-01-04 ENCOUNTER — Other Ambulatory Visit: Payer: Self-pay

## 2020-01-12 ENCOUNTER — Other Ambulatory Visit: Payer: Self-pay | Admitting: Primary Care

## 2020-01-12 ENCOUNTER — Telehealth: Payer: Self-pay

## 2020-01-12 NOTE — Telephone Encounter (Signed)
I have Ms Eppes her in the [place] a/b a refill on her inhaler.Hillery Hunter

## 2020-01-12 NOTE — Telephone Encounter (Signed)
Patient came into office for refill of her rescue inhaler. Asked patient which pharmacy she preferred. RX has been sent in. Nothing further needed at this time.

## 2020-01-22 ENCOUNTER — Ambulatory Visit (INDEPENDENT_AMBULATORY_CARE_PROVIDER_SITE_OTHER): Payer: Medicaid Other | Admitting: Psychiatry

## 2020-01-22 ENCOUNTER — Other Ambulatory Visit: Payer: Self-pay

## 2020-01-22 ENCOUNTER — Encounter (HOSPITAL_COMMUNITY): Payer: Self-pay | Admitting: Psychiatry

## 2020-01-22 DIAGNOSIS — F4310 Post-traumatic stress disorder, unspecified: Secondary | ICD-10-CM | POA: Diagnosis not present

## 2020-01-22 NOTE — Progress Notes (Signed)
Virtual Visit via Video Note  I connected with [name] on 01/22/20 at  3:30 PM EST by a video enabled telemedicine application and verified that I am speaking with the correct person using two identifiers.  Location: Patient: Patient Home Provider: Home Office  I discussed the limitations of evaluation and management by telemedicine and the availability of in person appointments. The patient expressed understanding and agreed to proceed.  History of Present Illness: PTSD  Treatment Plan Goals: 1) Pt to attend counseling biweekly to assist coping w/ anxiety and anger.  2) Pt to use CBT skills learned in session to address depression/grief and loss symptoms/responses.  Observations/Objective: Counselor met with Client for individual therapy via Webex. Counselor assessed MH symptoms and progress on treatment plan goals,with client reporting that she did not feel like meeting for therapy today due to feeling tired and experiencing menstrual pain. As we continued to check in Client decided that it would be good to discuss current stressors she is experiencing. Client decided to proceed with session. Client presents with moderatedepression and moderateanxiety. Client denied suicidal ideation or self-harm behaviors.   Goal 1-2) Client states that she has missed a couple sessions as she has had low energy, motivation and high levels of stress associated with roll as a single parent. Counselor used CBT interventions and MI skills to process thoughts, feelings and behaviors that are impacting her ability to function. Client identified factors and her internal experiences. Counselor and Client focused on problem-solving and solution focused ideas for better incorporating self-care, her support system and engaging in a more present manner with her children, to make parenting more fun and enjoyable. Client identified actions steps she is willing to implement and will report back at next session.  Client reports that CPS has never followed up on recommendations or formal support. Counselor assessed safety of children, with limitation to Client report and observing interactions when they entered room/engaged with Client in session. Counselor has no safety concerns at this time based on limited ability to assess via virtual session.   Assessment and Plan: Counselor will continue to meet with patient to address treatment plan goals. Patient will continue to follow recommendations of providers and implement skills learned in session.  Follow Up Instructions: Counselor will send information for next session via Webex.   The patient was advised to call back or seek an in-person evaluation if the symptoms worsen or if the condition fails to improve as anticipated.  I provided 59minutes of non-face-to-face time during this encounter.    , LCSW 

## 2020-02-12 ENCOUNTER — Ambulatory Visit (INDEPENDENT_AMBULATORY_CARE_PROVIDER_SITE_OTHER): Payer: Medicaid Other | Admitting: Psychiatry

## 2020-02-12 ENCOUNTER — Other Ambulatory Visit: Payer: Self-pay

## 2020-02-12 ENCOUNTER — Encounter (HOSPITAL_COMMUNITY): Payer: Self-pay | Admitting: Psychiatry

## 2020-02-12 DIAGNOSIS — F4310 Post-traumatic stress disorder, unspecified: Secondary | ICD-10-CM

## 2020-02-12 NOTE — Progress Notes (Signed)
Virtual Visit via Telephone Note  I connected with [name] on 02/12/20 at  3:30 PM EST by telephone and verified that I am speaking with the correct person using two identifiers.  Location: Patient: Patient Home Provider: Home Office  I discussed the limitations of evaluation and management by telemedicine and the availability of in person appointments. The patient expressed understanding and agreed to proceed.  History of Present Illness: PTSD  Treatment Plan Goals: 1) Pt to attend counseling biweekly to assist coping w/ anxiety and anger.  2) Pt to use CBT skills learned in session to address depression/grief and loss symptoms/responses.  Observations/Objective: Counselor met with Client for individual therapy via Webex. Counselor assessed MH symptoms and progress on treatment plan goals,with client reporting that she is currently frustrated and ready to make changes in her life. Client presents with moderatedepression and moderateanxiety. Client denied suicidal ideation or self-harm behaviors.   Goal 1) Counselor and Client discussed involvement in counseling and application of skills and strategies discussed in sessions. Client noted that she was not interested in therapy at first, but she is seeing th value in it now. Client discussed wanting to set up additional appointments, asked specific questions related to her life stressors and how she is working to better herself as a mother and individual. Client's anger is increasing due to concerns with how her partner chooses to parent their children. Counselor and Client identified boundary setting and communication skills to use in order to regain understanding of what is appropriate for the kids.   Assessment and Plan: Counselor will continue to meet with patient to address treatment plan goals. Patient will continue to follow recommendations of providers and implement skills learned in session.  Follow Up  Instructions: Counselor will send information for next session via Webex.   The patient was advised to call back or seek an in-person evaluation if the symptoms worsen or if the condition fails to improve as anticipated.  I provided 29minutes of non-face-to-face time during this encounter.   Lise Auer, LCSW

## 2020-02-13 ENCOUNTER — Other Ambulatory Visit: Payer: Self-pay

## 2020-02-13 ENCOUNTER — Telehealth (HOSPITAL_COMMUNITY): Payer: Medicaid Other | Admitting: Psychiatry

## 2020-02-26 ENCOUNTER — Ambulatory Visit (INDEPENDENT_AMBULATORY_CARE_PROVIDER_SITE_OTHER): Payer: Medicaid Other | Admitting: Psychiatry

## 2020-02-26 ENCOUNTER — Encounter (HOSPITAL_COMMUNITY): Payer: Self-pay | Admitting: Psychiatry

## 2020-02-26 ENCOUNTER — Other Ambulatory Visit: Payer: Self-pay

## 2020-02-26 DIAGNOSIS — F330 Major depressive disorder, recurrent, mild: Secondary | ICD-10-CM

## 2020-02-26 DIAGNOSIS — F4310 Post-traumatic stress disorder, unspecified: Secondary | ICD-10-CM | POA: Diagnosis not present

## 2020-02-26 NOTE — Progress Notes (Signed)
Virtual Visit via Video Note  I connected with [name] on 02/26/20 at  3:30 PM EST by a video enabled telemedicine application and verified that I am speaking with the correct person using two identifiers.  Location: Patient: Patient Home Provider: Home Office  I discussed the limitations of evaluation and management by telemedicine and the availability of in person appointments. The patient expressed understanding and agreed to proceed.  History of Present Illness: PTSD  Treatment Plan Goals: 1) Pt to attend counseling biweekly to assist coping w/ anxiety and anger.  2) Pt to use CBT skills learned in session to address depression/grief and loss symptoms/responses.  Observations/Objective: Counselor met with Client for individual therapy via Webex. Counselor assessed MH symptoms and progress on treatment plan goals,with client reporting that she has "been fine" and is currently feeling tired, needing a "nap". Client reports higher levels of stress due to single parenting responsibilities. Client presents with moderatedepression and moderateanxiety. Client denied suicidal ideation or self-harm behaviors.   Goal 1) Counselor and Client discussed involvement in counseling and application of skills and strategies discussed in sessions. Client presented as disinterested in participating in treatment at onset of session, then slowly warmed up AEB communicating more about experiences, stressors and needs. Client was often pulled away by session to briefly attend to young children's needs. Counselor explored feelings of anxiety and anger related to life stressors. Client engaged in processing and identified needs. Counselor presented relaxation techniques to client and explore obstacles to resting. Client requested additional relaxation resources to try before next session.  Assessment and Plan: Counselor will continue to meet with patient to address treatment plan goals. Patient will  continue to follow recommendations of providers and implement skills learned in session.  Follow Up Instructions: Counselor will send information for next session via Webex.   The patient was advised to call back or seek an in-person evaluation if the symptoms worsen or if the condition fails to improve as anticipated.  I provided 59minutes of non-face-to-face time during this encounter.   Lise Auer, LCSW

## 2020-03-09 DIAGNOSIS — H5712 Ocular pain, left eye: Secondary | ICD-10-CM | POA: Diagnosis not present

## 2020-03-09 DIAGNOSIS — H5789 Other specified disorders of eye and adnexa: Secondary | ICD-10-CM | POA: Diagnosis not present

## 2020-03-09 DIAGNOSIS — S0502XA Injury of conjunctiva and corneal abrasion without foreign body, left eye, initial encounter: Secondary | ICD-10-CM | POA: Diagnosis not present

## 2020-03-09 DIAGNOSIS — Z113 Encounter for screening for infections with a predominantly sexual mode of transmission: Secondary | ICD-10-CM | POA: Diagnosis not present

## 2020-03-11 ENCOUNTER — Other Ambulatory Visit: Payer: Self-pay

## 2020-03-11 ENCOUNTER — Ambulatory Visit (HOSPITAL_COMMUNITY): Payer: Medicaid Other | Admitting: Psychiatry

## 2020-03-11 DIAGNOSIS — F4310 Post-traumatic stress disorder, unspecified: Secondary | ICD-10-CM

## 2020-03-11 NOTE — Progress Notes (Signed)
Counselor send two reminders, called leaving a voicemail to connec with the Client, with no success. Counselor shared ways to connect with Client. Will plan to meet at next appointed time.  Lise Auer, LCSW

## 2020-03-15 ENCOUNTER — Telehealth (INDEPENDENT_AMBULATORY_CARE_PROVIDER_SITE_OTHER): Payer: Medicaid Other | Admitting: Psychiatry

## 2020-03-15 ENCOUNTER — Other Ambulatory Visit: Payer: Self-pay

## 2020-03-15 DIAGNOSIS — F330 Major depressive disorder, recurrent, mild: Secondary | ICD-10-CM

## 2020-03-15 DIAGNOSIS — F410 Panic disorder [episodic paroxysmal anxiety] without agoraphobia: Secondary | ICD-10-CM

## 2020-03-15 DIAGNOSIS — F4310 Post-traumatic stress disorder, unspecified: Secondary | ICD-10-CM

## 2020-03-15 MED ORDER — ALPRAZOLAM 1 MG PO TABS: 1.0000 mg | ORAL_TABLET | Freq: Two times a day (BID) | ORAL | 2 refills | Status: AC | PRN

## 2020-03-15 MED ORDER — SERTRALINE HCL 100 MG PO TABS: 100.0000 mg | ORAL_TABLET | Freq: Every day | ORAL | 2 refills | Status: DC
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
## 2020-03-17 ENCOUNTER — Encounter (HOSPITAL_COMMUNITY): Payer: Self-pay | Admitting: Psychiatry

## 2020-03-25 ENCOUNTER — Other Ambulatory Visit: Payer: Self-pay

## 2020-03-25 ENCOUNTER — Ambulatory Visit (HOSPITAL_COMMUNITY): Payer: Medicaid Other | Admitting: Psychiatry

## 2020-04-02 ENCOUNTER — Other Ambulatory Visit: Payer: Self-pay | Admitting: Primary Care

## 2020-05-22 ENCOUNTER — Other Ambulatory Visit: Payer: Self-pay | Admitting: Pulmonary Disease

## 2020-06-04 ENCOUNTER — Other Ambulatory Visit: Payer: Self-pay | Admitting: Pulmonary Disease

## 2020-06-05 ENCOUNTER — Telehealth (HOSPITAL_COMMUNITY): Payer: Medicaid Other | Admitting: Psychiatry

## 2020-06-06 ENCOUNTER — Other Ambulatory Visit: Payer: Self-pay | Admitting: Primary Care

## 2020-06-06 ENCOUNTER — Telehealth: Payer: Self-pay | Admitting: Pulmonary Disease

## 2020-06-06 MED ORDER — ALBUTEROL SULFATE HFA 108 (90 BASE) MCG/ACT IN AERS: 2.0000 | INHALATION_SPRAY | RESPIRATORY_TRACT | 0 refills | Status: DC | PRN

## 2020-06-06 NOTE — Telephone Encounter (Signed)
   Dispensed Days Supply Quantity Provider Pharmacy  PROAIR HFA ORAL INH (200 PFS) 8.5G 05/24/2020 16 8.5 g [place] #...  PROAIR HFA ORAL INH (200 PFS) 8.5G 05/14/2020 16 8.5 g [place] #...  ALBUTEROL HFA INH (200 PUFFS)8.5GM 05/03/2020 16 8.5 g [place] #...  PROAIR HFA ORAL INH (200 PFS) 8.5G 04/22/2020 16 8.5 g [place] #...  PROAIR HFA ORAL INH (200 PFS) 8.5G 04/03/2020 16 8.5 g [place] #...  ALBUTEROL HFA INH(200 PUFFS)18GM 04/02/2020 25 18 g [place] #...  PROAIR HFA ORAL INH (200 PFS) 8.5G 03/20/2020 16 8.5 g [place] #...  PROAIR HFA ORAL INH (200 PFS) 8.5G 03/08/2020 16 8.5 g [place] #...  PROAIR HFA ORAL INH (200 PFS) 8.5G 02/25/2020 16 8.5 g [place] #...  PROAIR HFA ORAL INH (200 PFS) 8.5G 02/07/2020 16 8.5 g [place] STORE #...  PROAIR HFA ORAL INH (200 PFS) 8.5G 01/29/2020 16 8.5 g [place] #...  PROAIR HFA ORAL INH (200 PFS) 8.5G 01/23/2020 16 8.5 g [place] #...  PROAIR HFA ORAL INH (200 PFS) 8.5G 01/12/2020 16 8.5 g [place] STORE #...  PROAIR HFA ORAL INH (200 PFS) 8.5G 12/27/2019 30 8.5 g [place] #...  PROAIR HFA ORAL INH (200 PFS) 8.5G 12/15/2019 16        You can send in 1 refill with no further refills until she is seen by [facility] tomorrow.  As above you can see that she is feeling her albuterol inhaler every 2 weeks this will need to be discussed tomorrow at visit with NP [name]

## 2020-06-06 NOTE — Telephone Encounter (Signed)
Called and spoke with patient to let her know that we are only sending in 1 inhaler and that we will not be able to send in anymore for her until she is seen in person for an OV. Advised her to keep her appointment for tomorrow with [facility]. She expressed understanding. Nothing further needed at this time.

## 2020-06-06 NOTE — Telephone Encounter (Signed)
Pt called and wanting a refill of her rescue inhaler.  I advised her that this was sent in for a refill on 05/22/20 and that it was too early to be refilled.  She tried to explain that she is allergic to something and has run out of her inhaler and that we normally fill this for her like that.  I explained to her that it has been 2 weeks since this was filled and we would not be able to fill this.  She wanted the message sent to someone to see if this could be filled.    I advised her that it has been over a year since she was seen and she would need an appt.  appt scheduled for tomorrow with BW at 4:30.  She said that she would try and make it.   TP please advise on refill of albuterol inhaler since BW is off.    Thanks  No Known Allergies

## 2020-06-07 ENCOUNTER — Ambulatory Visit: Payer: Self-pay | Admitting: Primary Care

## 2020-06-07 DIAGNOSIS — F329 Major depressive disorder, single episode, unspecified: Secondary | ICD-10-CM | POA: Insufficient documentation

## 2020-06-07 DIAGNOSIS — F32A Depression, unspecified: Secondary | ICD-10-CM | POA: Insufficient documentation

## 2020-06-07 DIAGNOSIS — B373 Candidiasis of vulva and vagina: Secondary | ICD-10-CM | POA: Insufficient documentation

## 2020-06-07 DIAGNOSIS — F419 Anxiety disorder, unspecified: Secondary | ICD-10-CM | POA: Insufficient documentation

## 2020-06-07 DIAGNOSIS — B3731 Acute candidiasis of vulva and vagina: Secondary | ICD-10-CM | POA: Insufficient documentation

## 2020-06-07 DIAGNOSIS — R1030 Lower abdominal pain, unspecified: Secondary | ICD-10-CM | POA: Insufficient documentation

## 2020-06-07 DIAGNOSIS — R197 Diarrhea, unspecified: Secondary | ICD-10-CM | POA: Insufficient documentation

## 2020-06-07 DIAGNOSIS — A549 Gonococcal infection, unspecified: Secondary | ICD-10-CM | POA: Insufficient documentation

## 2020-06-07 DIAGNOSIS — N898 Other specified noninflammatory disorders of vagina: Secondary | ICD-10-CM | POA: Insufficient documentation

## 2020-06-07 DIAGNOSIS — A749 Chlamydial infection, unspecified: Secondary | ICD-10-CM | POA: Insufficient documentation

## 2020-06-07 NOTE — Progress Notes (Deleted)
@Patient  ID: [name], female    DOB: Feb 04, 1990, 30 y.o.   MRN: [id number]  No chief complaint on file.   Referring provider: No ref. provider found  HPI: 30 year old female, former light smoker quit in March 2016 (2-pack-year history).   30 year old female, former smoker quit in 2016 (2-pack-year history).  History significant for asthma, substance abuse, major depressive disorder, PTSD.  Patient of Dr. Vaughan Browner, last seen 11/02/2018 for new patient consult (former Dr. Melvyn Novas).  Started on Singulair and resumed Symbicort.  IgE >1,000.  Eosinophil absolute 100.  Pulmonary function tests are still active.   Previous [facility] pulmonary encounters: 11/02/18 - Dr, Vaughan Browner Consult  30 year old with history of childhood asthma, allergies and anxiety.  Previously followed by Dr. Pamella Pert with asthma at the age of 36.  Symptoms are worsening over the past 2 years with daily symptoms of dyspnea, nighttime awakenings.  She is using Symbicort intermittently and albuterol intermittently.  She is currently out of all inhalers. She is been told that she is allergic to multiple environmental allergens and pets in the past  ACT score 11/02/2018-9  06/16/2019 Patient presents today for regular follow-up, she missed her visit yesterday with Wyn Quaker, NP. She has both of her daughters with her today. She states that her asthma symptoms are worse d/t seasonal allergies. She is using Symbicort 160 as prescribed twice a day. She is having to use her albuterol 8 times a day. States that it helps for a short period of times. She notices shortness of breath and chest tightness at random times throughout the day. She is physically active. She is around people that smoke. Denies tobacco use, smells of marijuana.   06/07/2020- Interim hx Patient presents today for 1 year follow-up.      PFTs: None on File  Imaging: 11/02/18 CXR- The heart size and mediastinal contours are within normal limits. Both lungs are clear.  The visualized skeletal structures are unremarkable.  Exposure hx:  Pets: No pets Occupation: Health visitor Exposures: No known exposures.  No mold, hot tub, Jacuzzi.  No down pillows or comforters Smoking history: No tobacco use.  Smokes weed every day Travel history: Originally from [place].  No significant recent travel Relevant family history: No significant family history of lung disease.   No Known Allergies  Immunization History  Administered Date(s) Administered  . Influenza Split 03/01/2012, 11/02/2013  . Influenza Whole 11/03/2011  . Influenza,inj,Quad PF,6+ Mos 12/01/2012  . Pneumococcal Polysaccharide-23 07/02/2012  . Tdap 03/29/2012, 06/25/2014    Past Medical History:  Diagnosis Date  . Anemia 2010  . Anxiety   . Asthma   . Depression    h/o pp depression after 1st pregnancy  . GERD (gastroesophageal reflux disease)     Tobacco History: Social History   Tobacco Use  Smoking Status Former Smoker  . Packs/day: 0.25  . Years: 2.00  . Pack years: 0.50  . Types: Cigarettes  . Quit date: 04/14/2014  . Years since quitting: 6.1  Smokeless Tobacco Never Used   Counseling given: Not Answered   Outpatient Medications Prior to Visit  Medication Sig Dispense Refill  . albuterol (PROAIR HFA) 108 (90 Base) MCG/ACT inhaler Inhale 2 puffs into the lungs every 4 (four) hours as needed for wheezing or shortness of breath. 8.5 g 0  . ALPRAZolam (XANAX) 1 MG tablet Take 1 tablet (1 mg total) by mouth 2 (two) times daily as needed for anxiety. 60 tablet 2  . budesonide-formoterol (SYMBICORT) 160-4.5 MCG/ACT inhaler  Inhale 2 puffs into the lungs 2 (two) times daily. 1 Inhaler 6  . cetirizine (ZYRTEC) 10 MG tablet Take 10 mg by mouth daily.    . montelukast (SINGULAIR) 10 MG tablet Take 1 tablet (10 mg total) by mouth at bedtime. 30 tablet 11  . sertraline (ZOLOFT) 100 MG tablet Take 1 tablet (100 mg total) by mouth daily. 30 tablet 2   No facility-administered medications prior  to visit.      Review of Systems  Review of Systems   Physical Exam  There were no vitals taken for this visit. Physical Exam   Lab Results:  CBC    Component Value Date/Time   WBC 3.0 (L) 11/02/2018 1235   RBC 4.98 11/02/2018 1235   HGB 14.1 11/02/2018 1235   HCT 43.3 11/02/2018 1235   PLT 268.0 11/02/2018 1235   MCV 87.0 11/02/2018 1235   MCH 27.6 03/22/2018 0845   MCHC 32.5 11/02/2018 1235   RDW 14.7 11/02/2018 1235   LYMPHSABS 1.5 11/02/2018 1235   MONOABS 0.1 11/02/2018 1235   EOSABS 0.1 11/02/2018 1235   BASOSABS 0.0 11/02/2018 1235    BMET    Component Value Date/Time   NA 140 03/22/2018 0845   K 3.6 03/22/2018 0845   CL 109 03/22/2018 0845   CO2 24 03/22/2018 0845   GLUCOSE 96 03/22/2018 0845   BUN 13 03/22/2018 0845   CREATININE 0.81 03/22/2018 0845   CREATININE 0.75 12/01/2012 0947   CALCIUM 9.0 03/22/2018 0845   GFRNONAA >60 03/22/2018 0845   GFRNONAA >89 12/01/2012 0947   GFRAA >60 03/22/2018 0845   GFRAA >89 12/01/2012 0947    BNP No results found for: BNP  ProBNP No results found for: PROBNP  Imaging: No results found.   Assessment & Plan:   No problem-specific Assessment & Plan notes found for this encounter.     Martyn Ehrich, NP 06/07/2020

## 2020-06-18 ENCOUNTER — Telehealth: Payer: Self-pay | Admitting: Pulmonary Disease

## 2020-06-18 MED ORDER — ALBUTEROL SULFATE HFA 108 (90 BASE) MCG/ACT IN AERS: 2.0000 | INHALATION_SPRAY | RESPIRATORY_TRACT | 0 refills | Status: DC | PRN

## 2020-06-18 NOTE — Telephone Encounter (Signed)
I called and spoke with patient regarding refill on ProAir. I sent in refill to preferred pharmacy and informed patient we cant send anymore in until after f/u appt with TP on 06/20/20. Patient verbalized understanding, nothing further needed.

## 2020-06-20 ENCOUNTER — Ambulatory Visit: Payer: Medicaid Other | Admitting: Adult Health

## 2020-06-27 ENCOUNTER — Telehealth (HOSPITAL_COMMUNITY): Payer: Self-pay | Admitting: *Deleted

## 2020-06-27 MED ORDER — SERTRALINE HCL 100 MG PO TABS: 100.0000 mg | ORAL_TABLET | Freq: Every day | ORAL | 0 refills | Status: AC

## 2020-06-27 NOTE — Telephone Encounter (Signed)
Pt called requesting refill of the Zoloft 100 mg qd. She is a former pt of Dr.Pucilowski. Looks like she had an appointment with you on 06/10/20 but that was cancelled. Refill after another appointment is made? Or #30 and provide letter and outside resources?

## 2020-06-27 NOTE — Telephone Encounter (Signed)
Chart reviewed.  A 30-day blood supply of Zoloft 100 mg given by by Covering MD. Patient must established care with new provider.

## 2020-06-28 ENCOUNTER — Other Ambulatory Visit: Payer: Self-pay | Admitting: Adult Health

## 2020-06-28 ENCOUNTER — Telehealth: Payer: Self-pay

## 2020-06-28 NOTE — Telephone Encounter (Signed)
LVM in regards to Albuterol inhaler unable to refill, pt will need a follow up appointment for any further refills.

## 2020-07-02 ENCOUNTER — Telehealth (HOSPITAL_COMMUNITY): Payer: Self-pay | Admitting: *Deleted

## 2020-07-02 NOTE — Telephone Encounter (Signed)
I don't see XANAX on her current list. Can you call pharmacy to verify?

## 2020-07-02 NOTE — Telephone Encounter (Signed)
Pt called requesting refill of Xanax. Last Rx written on 03/15/20 with 2 refills. Pt being referred out of practice as she was pt of Dr. Montel Culver.

## 2020-07-03 MED ORDER — ALPRAZOLAM 1 MG PO TABS: 1.0000 mg | ORAL_TABLET | Freq: Every day | ORAL | 0 refills | Status: AC | PRN

## 2020-07-03 NOTE — Telephone Encounter (Signed)
Per [facility] pt last filed Xanax on 05/24/20 #30.

## 2020-07-03 NOTE — Telephone Encounter (Signed)
I called # 30 Xanax bridge supply at [facility] [place]. Patient must established care with new provider.

## 2020-07-11 ENCOUNTER — Ambulatory Visit (HOSPITAL_COMMUNITY): Payer: Medicaid Other | Admitting: Psychiatry

## 2020-07-11 ENCOUNTER — Other Ambulatory Visit: Payer: Self-pay

## 2020-07-14 DIAGNOSIS — J45909 Unspecified asthma, uncomplicated: Secondary | ICD-10-CM | POA: Diagnosis not present

## 2020-07-14 DIAGNOSIS — R0602 Shortness of breath: Secondary | ICD-10-CM | POA: Diagnosis not present

## 2020-07-25 ENCOUNTER — Telehealth: Payer: Self-pay | Admitting: Pulmonary Disease

## 2020-07-25 MED ORDER — ALBUTEROL SULFATE HFA 108 (90 BASE) MCG/ACT IN AERS: 2.0000 | INHALATION_SPRAY | RESPIRATORY_TRACT | 0 refills | Status: AC | PRN

## 2020-07-25 NOTE — Telephone Encounter (Signed)
Called and spoke with patient. Patient hadn't been seen in over 1 year. Called and spoke with patient. She is schedule with Dr. Vaughan Browner on 6/28.  Albuterol sent into the pharmacy. Patient is aware.  Nothing further needed at this time.

## 2020-07-30 ENCOUNTER — Ambulatory Visit: Payer: Medicaid Other | Admitting: Pulmonary Disease

## 2020-08-04 ENCOUNTER — Ambulatory Visit (HOSPITAL_COMMUNITY)
Admission: EM | Admit: 2020-08-04 | Discharge: 2020-08-04 | Disposition: A | Discharge status: Home / Self Care | Payer: Medicaid Other | Attending: Student | Admitting: Student

## 2020-08-04 ENCOUNTER — Encounter (HOSPITAL_COMMUNITY): Payer: Self-pay | Admitting: *Deleted

## 2020-08-04 ENCOUNTER — Other Ambulatory Visit: Payer: Self-pay

## 2020-08-04 DIAGNOSIS — H66001 Acute suppurative otitis media without spontaneous rupture of ear drum, right ear: Secondary | ICD-10-CM

## 2020-08-04 MED ORDER — AMOXICILLIN 875 MG PO TABS: 875.0000 mg | ORAL_TABLET | Freq: Two times a day (BID) | ORAL | 0 refills | Status: DC

## 2020-08-04 NOTE — ED Provider Notes (Signed)
[facility]    CSN: [id number] Arrival date & time: 08/04/20  1705      History   Chief Complaint Chief Complaint  Patient presents with   Otalgia    RT    HPI [name] is a 30 y.o. female presenting with R otalgia x3 days, getting worse. Muffled hearing. Denies tinnitus, dizziness.  Denies recent URI, fever/chills. Medical history depression, anxiety, asthma, GERD.  Denies allergic rhinitis component.  HPI  Past Medical History:  Diagnosis Date   Anemia 2010   Anxiety    Asthma    Depression    h/o pp depression after 1st pregnancy   GERD (gastroesophageal reflux disease)     Patient Active Problem List   Diagnosis Date Noted   Candidiasis of vagina 06/07/2020   Chlamydial infection 06/07/2020   Chronic anxiety 06/07/2020   Diarrhea 06/07/2020   Gonorrhea 06/07/2020   Lower abdominal pain 06/07/2020   Pruritus of vagina 06/07/2020   Vaginal discharge 06/07/2020   Depressive disorder 06/07/2020   Episodic mood disorder ([place]) 08/01/2019   Intramural leiomyoma of uterus 07/12/2019   Allergic rhinitis, unspecified 06/19/2018   PTSD (post-traumatic stress disorder) 03/14/2018   Panic disorder    Benzodiazepine dependence ([place])    Major depressive disorder, recurrent episode, 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   Bacterial vaginosis 06/26/2014   Pain in joint, shoulder region 12/22/2012   S/P cesarean section 07/02/2012   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   Asthma 02/17/2012   Nausea/vomiting in pregnancy 11/05/2011   Postpartum depression 02/03/2008    Past Surgical History:  Procedure Laterality Date   CESAREAN SECTION  11/01/2008   CESAREAN SECTION N/A 06/29/2012   Procedure: CESAREAN SECTION;  Surgeon: Melina Schools, MD;  Location: [facility];  Service: Obstetrics;  Laterality: N/A;  1 1/2 hrs OR time    CESAREAN SECTION N/A 12/18/2014   Procedure: REPEAT CESAREAN SECTION;  Surgeon: Frederico Hamman, MD;  Location: [facility];  Service: Obstetrics;  Laterality:  N/A;   WISDOM TOOTH EXTRACTION      OB History     Gravida  4   Para  3   Term  3   Preterm  0   AB  1   Living  1      SAB  0   IAB  1   Ectopic  0   Multiple  0   Live Births  1            Home Medications    Prior to Admission medications   Medication Sig Start Date End Date Taking? Authorizing Provider  albuterol (PROAIR HFA) 108 (90 Base) MCG/ACT inhaler Inhale 2 puffs into the lungs every 4 (four) hours as needed for wheezing or shortness of breath. 07/25/20   Mannam, Praveen, MD  ALPRAZolam (XANAX) 1 MG tablet Take 1 tablet (1 mg total) by mouth daily as needed for anxiety. 07/03/20   Arfeen, Arlyce Harman, MD  amoxicillin (AMOXIL) 875 MG tablet Take 1 tablet (875 mg total) by mouth 2 (two) times daily. 08/04/20   Hazel Sams, PA-C  budesonide-formoterol [facility]) 160-4.5 MCG/ACT inhaler Inhale 2 puffs into the lungs 2 (two) times daily. 06/16/19   Martyn Ehrich, NP  cetirizine (ZYRTEC) 10 MG tablet Take 10 mg by mouth daily.    [provider]  montelukast (SINGULAIR) 10 MG tablet Take 1 tablet (10 mg total) by mouth at bedtime. 06/16/19   Martyn Ehrich, NP  sertraline (ZOLOFT) 100 MG  tablet Take 1 tablet (100 mg total) by mouth daily. 06/27/20 09/25/20  Arfeen, Arlyce Harman, MD    Family History Family History  Problem Relation Age of Onset   Asthma Mother    Arthritis Mother    Diabetes Mother    Hypertension Mother    Allergies Mother    Hypertension Father    Asthma Sister    Allergies Son    Asthma Son    Other Neg Hx     Social History Social History   Tobacco Use   Smoking status: Former    Packs/day: 0.25    Years: 2.00    Pack years: 0.50    Types: Cigarettes    Quit date: 04/14/2014    Years since quitting: 6.3   Smokeless tobacco: Never  Vaping Use   Vaping Use: Never used  Substance Use Topics   Alcohol use: Yes    Alcohol/week: 2.0 standard drinks    Types: 2 Standard drinks or equivalent per week    Comment:  occasionally   Drug use: Yes    Frequency: 2.0 times per week    Types: Marijuana     Allergies   Pollen extract   Review of Systems Review of Systems  Constitutional:  Negative for appetite change, chills and fever.  HENT:  Positive for ear pain. Negative for congestion, rhinorrhea, sinus pressure, sinus pain and sore throat.   Eyes:  Negative for redness and visual disturbance.  Respiratory:  Negative for cough, chest tightness, shortness of breath and wheezing.   Cardiovascular:  Negative for chest pain and palpitations.  Gastrointestinal:  Negative for abdominal pain, constipation, diarrhea, nausea and vomiting.  Genitourinary:  Negative for dysuria, frequency and urgency.  Musculoskeletal:  Negative for myalgias.  Neurological:  Negative for dizziness, weakness and headaches.  Psychiatric/Behavioral:  Negative for confusion.   All other systems reviewed and are negative.   Physical Exam Triage Vital Signs ED Triage Vitals  Enc Vitals Group     BP 08/04/20 1717 112/70     Pulse Rate 08/04/20 1717 82     Resp 08/04/20 1717 16     Temp 08/04/20 1717 99 F (37.2 C)     Temp src --      SpO2 08/04/20 1717 100 %     Weight --      Height --      Head Circumference --      Peak Flow --      Pain Score 08/04/20 1714 10     Pain Loc --      Pain Edu? --      Excl. in [place]? --    No data found.  Updated Vital Signs BP 112/70   Pulse 82   Temp 99 F (37.2 C)   Resp 16   LMP 07/28/2020   SpO2 100%   Visual Acuity Right Eye Distance:   Left Eye Distance:   Bilateral Distance:    Right Eye Near:   Left Eye Near:    Bilateral Near:     Physical Exam Vitals reviewed.  Constitutional:      General: She is not in acute distress.    Appearance: Normal appearance. She is not ill-appearing.  HENT:     Head: Normocephalic and atraumatic.     Right Ear: Hearing, ear canal and external ear normal. Tenderness present. No swelling. There is no impacted cerumen. No  mastoid tenderness. Tympanic membrane is erythematous. Tympanic membrane is not perforated, retracted  or bulging.     Left Ear: Hearing, tympanic membrane, ear canal and external ear normal. No swelling or tenderness. There is no impacted cerumen. No mastoid tenderness. Tympanic membrane is not perforated, erythematous, retracted or bulging.     Nose:     Right Sinus: No maxillary sinus tenderness or frontal sinus tenderness.     Left Sinus: No maxillary sinus tenderness or frontal sinus tenderness.     Mouth/Throat:     Mouth: Mucous membranes are moist.     Pharynx: Uvula midline. No oropharyngeal exudate or posterior oropharyngeal erythema.     Tonsils: No tonsillar exudate.  Cardiovascular:     Rate and Rhythm: Normal rate and regular rhythm.     Heart sounds: Normal heart sounds.  Pulmonary:     Breath sounds: Normal breath sounds and air entry. No wheezing, rhonchi or rales.  Chest:     Chest wall: No tenderness.  Abdominal:     General: Abdomen is flat. Bowel sounds are normal.     Tenderness: There is no abdominal tenderness. There is no guarding or rebound.  Lymphadenopathy:     Cervical: No cervical adenopathy.  Neurological:     General: No focal deficit present.     Mental Status: She is alert and oriented to person, place, and time.  Psychiatric:        Attention and Perception: Attention and perception normal.        Mood and Affect: Mood and affect normal.        Behavior: Behavior normal. Behavior is cooperative.        Thought Content: Thought content normal.        Judgment: Judgment normal.     UC Treatments / Results  Labs (all labs ordered are listed, but only abnormal results are displayed) Labs Reviewed - No data to display  EKG   Radiology No results found.  Procedures Procedures (including critical care time)  Medications Ordered in UC Medications - No data to display  Initial Impression / Assessment and Plan / UC Course  I have reviewed the  triage vital signs and the nursing notes.  Pertinent labs & imaging results that were available during my care of the patient were reviewed by me and considered in my medical decision making (see chart for details).     This patient is a very pleasant 30 y.o. year old female presenting with R AOM.  Afebrile, nontachycardic.  States she is not pregnant.  Amoxicillin as below, ibuprofen for discomfort.  ED return precautions discussed, patient verbalizes understanding and agreement.  Final Clinical Impressions(s) / UC Diagnoses   Final diagnoses:  Non-recurrent acute suppurative otitis media of right ear without spontaneous rupture of tympanic membrane     Discharge Instructions      -Start the antibiotic-Amoxicillin, 1 pill every 12 hours for 7 days.  You can take this with food like with breakfast and dinner. -For discomfort, Take Tylenol 1000 mg 3 times daily, and ibuprofen 800 mg 3 times daily with food.  You can take these together, or alternate every 3-4 hours. -Seek additional medical attention if symptoms worsen/persist.      ED Prescriptions     Medication Sig Dispense Auth. Provider   amoxicillin (AMOXIL) 875 MG tablet  (Status: Discontinued) Take 1 tablet (875 mg total) by mouth 2 (two) times daily. 7 tablet Hazel Sams, PA-C   amoxicillin (AMOXIL) 875 MG tablet Take 1 tablet (875 mg total) by mouth 2 (two)  times daily. 7 tablet Hazel Sams, PA-C      PDMP not reviewed this encounter.   Hazel Sams, PA-C 08/04/20 [phone]

## 2020-08-04 NOTE — Discharge Instructions (Addendum)
-  Start the antibiotic-Amoxicillin, 1 pill every 12 hours for 7 days.  You can take this with food like with breakfast and dinner. -For discomfort, Take Tylenol 1000 mg 3 times daily, and ibuprofen 800 mg 3 times daily with food.  You can take these together, or alternate every 3-4 hours. -Seek additional medical attention if symptoms worsen/persist.

## 2020-08-04 NOTE — ED Triage Notes (Signed)
Pt reports Rt ear pain.

## 2020-08-23 ENCOUNTER — Ambulatory Visit (HOSPITAL_COMMUNITY): Payer: Medicaid Other | Admitting: Physician Assistant

## 2020-11-26 DIAGNOSIS — J4541 Moderate persistent asthma with (acute) exacerbation: Secondary | ICD-10-CM | POA: Diagnosis not present

## 2020-11-26 DIAGNOSIS — Z760 Encounter for issue of repeat prescription: Secondary | ICD-10-CM | POA: Diagnosis not present

## 2020-12-05 DIAGNOSIS — N760 Acute vaginitis: Secondary | ICD-10-CM | POA: Diagnosis not present

## 2020-12-05 DIAGNOSIS — N898 Other specified noninflammatory disorders of vagina: Secondary | ICD-10-CM | POA: Diagnosis not present

## 2020-12-05 DIAGNOSIS — N914 Secondary oligomenorrhea: Secondary | ICD-10-CM | POA: Diagnosis not present

## 2020-12-05 DIAGNOSIS — Z6827 Body mass index (BMI) 27.0-27.9, adult: Secondary | ICD-10-CM | POA: Diagnosis not present

## 2020-12-05 DIAGNOSIS — B9689 Other specified bacterial agents as the cause of diseases classified elsewhere: Secondary | ICD-10-CM | POA: Diagnosis not present

## 2020-12-05 DIAGNOSIS — F411 Generalized anxiety disorder: Secondary | ICD-10-CM | POA: Diagnosis not present

## 2020-12-05 DIAGNOSIS — Z7251 High risk heterosexual behavior: Secondary | ICD-10-CM | POA: Diagnosis not present

## 2020-12-11 DIAGNOSIS — N914 Secondary oligomenorrhea: Secondary | ICD-10-CM | POA: Diagnosis not present

## 2020-12-11 DIAGNOSIS — J45909 Unspecified asthma, uncomplicated: Secondary | ICD-10-CM | POA: Diagnosis not present

## 2020-12-11 DIAGNOSIS — Z6827 Body mass index (BMI) 27.0-27.9, adult: Secondary | ICD-10-CM | POA: Diagnosis not present

## 2020-12-18 DIAGNOSIS — R5383 Other fatigue: Secondary | ICD-10-CM | POA: Diagnosis not present

## 2020-12-18 DIAGNOSIS — E559 Vitamin D deficiency, unspecified: Secondary | ICD-10-CM | POA: Diagnosis not present

## 2021-02-09 DIAGNOSIS — N914 Secondary oligomenorrhea: Secondary | ICD-10-CM | POA: Diagnosis not present

## 2021-02-09 DIAGNOSIS — J45909 Unspecified asthma, uncomplicated: Secondary | ICD-10-CM | POA: Diagnosis not present

## 2021-02-09 DIAGNOSIS — E663 Overweight: Secondary | ICD-10-CM | POA: Diagnosis not present

## 2021-02-22 ENCOUNTER — Other Ambulatory Visit: Payer: Self-pay

## 2021-02-22 ENCOUNTER — Encounter (HOSPITAL_COMMUNITY): Payer: Self-pay

## 2021-02-22 ENCOUNTER — Emergency Department (HOSPITAL_COMMUNITY)
Admission: EM | Admit: 2021-02-22 | Discharge: 2021-02-22 | Disposition: A | Discharge status: Home / Self Care | Payer: Medicaid Other | Attending: Emergency Medicine | Admitting: Emergency Medicine

## 2021-02-22 DIAGNOSIS — R1084 Generalized abdominal pain: Secondary | ICD-10-CM | POA: Diagnosis not present

## 2021-02-22 DIAGNOSIS — R102 Pelvic and perineal pain: Secondary | ICD-10-CM | POA: Diagnosis not present

## 2021-02-22 DIAGNOSIS — R100 Acute abdomen: Secondary | ICD-10-CM | POA: Diagnosis not present

## 2021-02-22 DIAGNOSIS — R109 Unspecified abdominal pain: Secondary | ICD-10-CM | POA: Diagnosis present

## 2021-02-22 DIAGNOSIS — R42 Dizziness and giddiness: Secondary | ICD-10-CM | POA: Insufficient documentation

## 2021-02-22 LAB — CBC
HCT: 37.2 % (ref 36.0–46.0)
Hemoglobin: 12.1 g/dL (ref 12.0–15.0)
MCH: 27.5 pg (ref 26.0–34.0)
MCHC: 32.5 g/dL (ref 30.0–36.0)
MCV: 84.5 fL (ref 80.0–100.0)
Platelets: 254 10*3/uL (ref 150–400)
RBC: 4.4 MIL/uL (ref 3.87–5.11)
RDW: 14 % (ref 11.5–15.5)
WBC: 5.3 10*3/uL (ref 4.0–10.5)
nRBC: 0 % (ref 0.0–0.2)

## 2021-02-22 LAB — COMPREHENSIVE METABOLIC PANEL
ALT: 21 U/L (ref 0–44)
AST: 19 U/L (ref 15–41)
Albumin: 4 g/dL (ref 3.5–5.0)
Alkaline Phosphatase: 36 U/L — ABNORMAL LOW (ref 38–126)
Anion gap: 5 (ref 5–15)
BUN: 11 mg/dL (ref 6–20)
CO2: 23 mmol/L (ref 22–32)
Calcium: 8.8 mg/dL — ABNORMAL LOW (ref 8.9–10.3)
Chloride: 109 mmol/L (ref 98–111)
Creatinine, Ser: 0.86 mg/dL (ref 0.44–1.00)
GFR, Estimated: >60 mL/min (ref >60)
Glucose, Bld: 99 mg/dL (ref 70–99)
Potassium: 3.9 mmol/L (ref 3.5–5.1)
Sodium: 137 mmol/L (ref 135–145)
Total Bilirubin: 0.5 mg/dL (ref 0.3–1.2)
Total Protein: 7.3 g/dL (ref 6.5–8.1)

## 2021-02-22 LAB — LIPASE, BLOOD: Lipase: 35 U/L (ref 11–51)

## 2021-02-22 LAB — I-STAT BETA HCG BLOOD, ED (MC, WL, AP ONLY): I-stat hCG, quantitative: <5 m[IU]/mL (ref <5)

## 2021-02-22 MED ORDER — ONDANSETRON 4 MG PO TBDP
4.0000 mg | ORAL_TABLET | Freq: Once | ORAL | Status: AC | PRN
  Administered 2021-02-22: 4 mg via ORAL
  Filled 2021-02-22: qty 1

## 2021-02-22 MED ORDER — LACTATED RINGERS IV BOLUS
1000.0000 mL | Freq: Once | INTRAVENOUS | Status: AC
  Administered 2021-02-22: 1000 mL via INTRAVENOUS

## 2021-02-22 MED ORDER — KETOROLAC TROMETHAMINE 30 MG/ML IJ SOLN
15.0000 mg | Freq: Once | INTRAMUSCULAR | Status: AC
  Administered 2021-02-22: 15 mg via INTRAVENOUS
  Filled 2021-02-22: qty 1

## 2021-02-22 MED ORDER — ACETAMINOPHEN 500 MG PO TABS: 500.0000 mg | ORAL_TABLET | Freq: Four times a day (QID) | ORAL | 0 refills | Status: AC | PRN

## 2021-02-22 NOTE — Discharge Instructions (Signed)
All the results in the ER are normal, labs and imaging. We suspect that the pain is because of your period.  If you start having worsening pain, vaginal bleeding, vaginal discharge, fevers or chills, return to the ER. The workup in the ER is not complete, and is limited to screening for life threatening and emergent conditions only, so please see a primary care doctor for further evaluation.

## 2021-02-22 NOTE — ED Triage Notes (Addendum)
Pt arrives EMS from home with c/o worsening cramps whole on menstrual cycle. Pt says she is bleeding much more than normal and becomes dizzy while walking. Marijuana use prior to arrival. Hx fibroids.

## 2021-02-23 NOTE — ED Provider Notes (Signed)
[facility] DEPT Provider Note   CSN: [id number] Arrival date & time: 02/22/21  0146     History  Chief Complaint  Patient presents with   Abdominal Cramping    [name] is a 31 y.o. female.  HPI    31 year old female comes in with chief complaint of abdominal cramping.  Patient reports that she is currently on her menstrual period, but her pain is worse than usual.  Pain present now for 1 day. the pain is of the same quality.  She is also reporting heavier bleeding than usual with this cycle of period.  Patient does not think she is pregnant.  She denies any vaginal discharge and denies any high risk behavior such as unprotected intercourse or multiple partners recently.  She has no UTI-like symptoms.  Prior to ED arrival, she started feeling dizziness and decided to come to the ER.  She indicates that she also has history of fibroids, but no history of ovarian cyst, tumors, kidney stones.  Patient denies any heavy drinking.  Home Medications Prior to Admission medications   Medication Sig Start Date End Date Taking? Authorizing Provider  acetaminophen (TYLENOL) 500 MG tablet Take 1 tablet (500 mg total) by mouth every 6 (six) hours as needed. 02/22/21  Yes Varney Biles, MD  albuterol (PROAIR HFA) 108 (90 Base) MCG/ACT inhaler Inhale 2 puffs into the lungs every 4 (four) hours as needed for wheezing or shortness of breath. 07/25/20  Yes Mannam, Praveen, MD  ALPRAZolam (XANAX) 1 MG tablet Take 1 tablet (1 mg total) by mouth daily as needed for anxiety. Patient taking differently: Take 0.5 mg by mouth daily as needed for anxiety. 07/03/20  Yes Arfeen, Arlyce Harman, MD  budesonide-formoterol (SYMBICORT) 160-4.5 MCG/ACT inhaler Inhale 2 puffs into the lungs 2 (two) times daily. 06/16/19  Yes Martyn Ehrich, NP  montelukast (SINGULAIR) 10 MG tablet Take 1 tablet (10 mg total) by mouth at bedtime. Patient taking differently: Take 10 mg by mouth daily as  needed (for allergies). 06/16/19  Yes Martyn Ehrich, NP  sertraline (ZOLOFT) 100 MG tablet Take 1 tablet (100 mg total) by mouth daily. Patient taking differently: Take 50 mg by mouth daily. 06/27/20 02/22/22 Yes Arfeen, Arlyce Harman, MD      Allergies    Pollen extract    Review of Systems   Review of Systems  Physical Exam Updated Vital Signs BP 118/74 (BP Location: Right Arm)    Pulse 64    Temp 98.2 F (36.8 C) (Oral)    Resp 18    Ht 5\' 5"  (1.651 m)    Wt 65.8 kg    SpO2 99%    BMI 24.13 kg/m  Physical Exam Vitals and nursing note reviewed.  Constitutional:      Appearance: She is well-developed.  HENT:     Head: Atraumatic.  Cardiovascular:     Rate and Rhythm: Normal rate.  Pulmonary:     Effort: Pulmonary effort is normal.  Abdominal:     Tenderness: There is abdominal tenderness. There is no guarding or rebound.     Comments: Generalized abdominal tenderness  Musculoskeletal:     Cervical back: Normal range of motion and neck supple.  Skin:    General: Skin is warm and dry.  Neurological:     Mental Status: She is alert and oriented to person, place, and time.    ED Results / Procedures / Treatments   Labs (all labs ordered are listed,  but only abnormal results are displayed) Labs Reviewed  COMPREHENSIVE METABOLIC PANEL - Abnormal; Notable for the following components:      Result Value   Calcium 8.8 (*)    Alkaline Phosphatase 36 (*)    All other components within normal limits  LIPASE, BLOOD  CBC  I-STAT BETA HCG BLOOD, ED ([facility], [facility], [facility] ONLY)    EKG None  Radiology No results found.  Procedures Procedures    Medications Ordered in ED Medications  ondansetron (ZOFRAN-ODT) disintegrating tablet 4 mg (4 mg Oral Given 02/22/21 0237)  ketorolac (TORADOL) 30 MG/ML injection 15 mg (15 mg Intravenous Given 02/22/21 0525)  lactated ringers bolus 1,000 mL (0 mLs Intravenous Stopped 02/22/21 0644)    ED Course/ Medical Decision Making/ A&P                            Medical Decision Making Amount and/or Complexity of Data Reviewed Labs: ordered.  Risk OTC drugs. Prescription drug management.   32 year old female comes in with chief complaint of abdominal pain for 1 day.  She reports that she is currently on her menstrual cycle, and the pain is more than usual.  She also had increased bleeding than usual.  She does not think she is pregnant.  She denies any vaginal discharge and concerns for STDs.  No UTI-like symptoms.  On exam, patient has generalized abdominal tenderness without any peritonitis.  She admits to marijuana use, no other illicit drug use, alcohol use.  She had some dizziness which really got her concerned and prompted her to come to the ER.  Patient appears well-hydrated.  Hemodynamically she stable, with vital signs that are within normal limits. Labs were ordered, they are reassuring.  Patient received IV hydration in the ED along with some pain medication, and on reassessment she reports that her pain has resolved.  Given the improvement in her symptoms, we did not proceed with pelvic exam.  Patient was offered pelvic exam, but she was comfortable in not getting it given that she also felt that the pain was related to her period and not because of STI.    Given the improvement in her symptoms and no peritonitis on her exam, and normal-appearing labs we do not think a CT scan is indicated.  Clinical suspicion for this generalized abdominal pain is less for conditions like ovarian torsion, therefore ultrasound not incorporated.  Pregnancy test negative, therefore ectopic ruled out.  Return precautions however discussed with her, she will come back if her pain gets worse or she starts having fevers, chills.        Final Clinical Impression(s) / ED Diagnoses Final diagnoses:  Pelvic pain in female  Abdominal cramping  Dizziness    Rx / DC Orders ED Discharge Orders          Ordered    acetaminophen (TYLENOL) 500 MG  tablet  Every 6 hours PRN        02/22/21 0258              Varney Biles, MD 02/23/21 0448

## 2021-02-24 ENCOUNTER — Telehealth: Payer: Self-pay

## 2021-02-24 NOTE — Telephone Encounter (Signed)
Transition Care Management Unsuccessful Follow-up Telephone Call  Date of discharge and from where:  02/22/2021-[facility]   Attempts:  1st Attempt  Reason for unsuccessful TCM follow-up call:  Left voice message

## 2021-02-25 NOTE — Telephone Encounter (Signed)
Transition Care Management Unsuccessful Follow-up Telephone Call  Date of discharge and from where:  02/22/2021 from [facility]  Attempts:  2nd Attempt  Reason for unsuccessful TCM follow-up call:  Left voice message

## 2021-02-26 NOTE — Telephone Encounter (Signed)
Transition Care Management Unsuccessful Follow-up Telephone Call  Date of discharge and from where:  02/22/2021-[facility]   Attempts:  3rd Attempt  Reason for unsuccessful TCM follow-up call:  Left voice message

## 2021-03-03 DIAGNOSIS — Z6827 Body mass index (BMI) 27.0-27.9, adult: Secondary | ICD-10-CM | POA: Diagnosis not present

## 2021-03-03 DIAGNOSIS — J45909 Unspecified asthma, uncomplicated: Secondary | ICD-10-CM | POA: Diagnosis not present

## 2021-06-17 DIAGNOSIS — B3731 Acute candidiasis of vulva and vagina: Secondary | ICD-10-CM | POA: Diagnosis not present

## 2021-09-29 ENCOUNTER — Emergency Department (HOSPITAL_COMMUNITY)
Admission: EM | Admit: 2021-09-29 | Discharge: 2021-09-29 | Disposition: A | Discharge status: Home / Self Care | Payer: Medicaid Other | Attending: Emergency Medicine | Admitting: Emergency Medicine

## 2021-09-29 ENCOUNTER — Encounter (HOSPITAL_COMMUNITY): Payer: Self-pay

## 2021-09-29 ENCOUNTER — Emergency Department (HOSPITAL_COMMUNITY): Payer: Medicaid Other

## 2021-09-29 DIAGNOSIS — Z20822 Contact with and (suspected) exposure to covid-19: Secondary | ICD-10-CM | POA: Diagnosis not present

## 2021-09-29 DIAGNOSIS — J45901 Unspecified asthma with (acute) exacerbation: Secondary | ICD-10-CM | POA: Insufficient documentation

## 2021-09-29 DIAGNOSIS — R0602 Shortness of breath: Secondary | ICD-10-CM | POA: Diagnosis present

## 2021-09-29 LAB — CBC WITH DIFFERENTIAL/PLATELET
Abs Immature Granulocytes: 0.01 10*3/uL (ref 0.00–0.07)
Basophils Absolute: 0 10*3/uL (ref 0.0–0.1)
Basophils Relative: 1 %
Eosinophils Absolute: 0.2 10*3/uL (ref 0.0–0.5)
Eosinophils Relative: 5 %
HCT: 39.1 % (ref 36.0–46.0)
Hemoglobin: 12.5 g/dL (ref 12.0–15.0)
Immature Granulocytes: 0 %
Lymphocytes Relative: 37 %
Lymphs Abs: 1.9 10*3/uL (ref 0.7–4.0)
MCH: 27.5 pg (ref 26.0–34.0)
MCHC: 32 g/dL (ref 30.0–36.0)
MCV: 86.1 fL (ref 80.0–100.0)
Monocytes Absolute: 0.3 10*3/uL (ref 0.1–1.0)
Monocytes Relative: 6 %
Neutro Abs: 2.6 10*3/uL (ref 1.7–7.7)
Neutrophils Relative %: 51 %
Platelets: 251 10*3/uL (ref 150–400)
RBC: 4.54 MIL/uL (ref 3.87–5.11)
RDW: 14.3 % (ref 11.5–15.5)
WBC: 5 10*3/uL (ref 4.0–10.5)
nRBC: 0 % (ref 0.0–0.2)

## 2021-09-29 LAB — BASIC METABOLIC PANEL
Anion gap: 7 (ref 5–15)
BUN: 10 mg/dL (ref 6–20)
CO2: 22 mmol/L (ref 22–32)
Calcium: 9 mg/dL (ref 8.9–10.3)
Chloride: 112 mmol/L — ABNORMAL HIGH (ref 98–111)
Creatinine, Ser: 0.88 mg/dL (ref 0.44–1.00)
GFR, Estimated: >60 mL/min (ref >60)
Glucose, Bld: 100 mg/dL — ABNORMAL HIGH (ref 70–99)
Potassium: 3.9 mmol/L (ref 3.5–5.1)
Sodium: 141 mmol/L (ref 135–145)

## 2021-09-29 LAB — RESP PANEL BY RT-PCR (FLU A&B, COVID) ARPGX2
Influenza A by PCR: NEGATIVE
Influenza B by PCR: NEGATIVE
SARS Coronavirus 2 by RT PCR: NEGATIVE

## 2021-09-29 LAB — I-STAT BETA HCG BLOOD, ED (MC, WL, AP ONLY): I-stat hCG, quantitative: <5 m[IU]/mL (ref <5)

## 2021-09-29 MED ORDER — BUDESONIDE-FORMOTEROL FUMARATE 160-4.5 MCG/ACT IN AERO
2.0000 | INHALATION_SPRAY | Freq: Two times a day (BID) | RESPIRATORY_TRACT | 12 refills | Status: AC
Start: 2021-09-29 — End: ?

## 2021-09-29 MED ORDER — PREDNISONE 10 MG PO TABS: 40.0000 mg | ORAL_TABLET | Freq: Every day | ORAL | 0 refills | Status: AC

## 2021-09-29 MED ORDER — ONDANSETRON 8 MG PO TBDP
8.0000 mg | ORAL_TABLET | Freq: Once | ORAL | Status: AC
  Administered 2021-09-29: 8 mg via ORAL
  Filled 2021-09-29: qty 1

## 2021-09-29 MED ORDER — ALBUTEROL SULFATE HFA 108 (90 BASE) MCG/ACT IN AERS: 1.0000 | INHALATION_SPRAY | Freq: Four times a day (QID) | RESPIRATORY_TRACT | 0 refills | Status: DC | PRN

## 2021-09-29 MED ORDER — MAGNESIUM SULFATE 2 GM/50ML IV SOLN
2.0000 g | Freq: Once | INTRAVENOUS | Status: AC
  Administered 2021-09-29: 2 g via INTRAVENOUS
  Filled 2021-09-29: qty 50

## 2021-09-29 MED ORDER — IPRATROPIUM-ALBUTEROL 0.5-2.5 (3) MG/3ML IN SOLN
3.0000 mL | RESPIRATORY_TRACT | Status: AC
  Administered 2021-09-29 (×3): 3 mL via RESPIRATORY_TRACT
  Filled 2021-09-29 (×2): qty 3

## 2021-09-29 MED ORDER — METHYLPREDNISOLONE SODIUM SUCC 125 MG IJ SOLR
125.0000 mg | Freq: Once | INTRAMUSCULAR | Status: AC
  Administered 2021-09-29: 125 mg via INTRAVENOUS
  Filled 2021-09-29: qty 2

## 2021-09-29 NOTE — ED Triage Notes (Signed)
Pt arrived via POV, c/o asthma attacks, states has used inhaler multiple times today. Speaking in few words at a time. Spo2 maintained at this time.

## 2021-09-29 NOTE — ED Provider Notes (Signed)
[facility] DEPT Provider Note   CSN: [id number] Arrival date & time: 09/29/21  0734     History  Chief Complaint  Patient presents with  . Asthma    [name] is a 31 y.o. female.  Patient is a 31 year old female with a past medical history of asthma presenting to the emergency department with shortness of breath.  Patient states that she woke up this morning with shortness of breath.  She states she was using her inhalers at home without significant relief.  She denies any recent fevers, chills or cough.  She denies any chest pain but endorses chest tightness.  She denies any lower extremity swelling.  She denies any other medical problems.  Denies ever needing BiPAP or intubation for her asthma in the past.  The history is provided by the patient. The history is limited by the condition of the patient.  Asthma       Home Medications Prior to Admission medications   Medication Sig Start Date End Date Taking? Authorizing Provider  albuterol (VENTOLIN HFA) 108 (90 Base) MCG/ACT inhaler Inhale 1-2 puffs into the lungs every 6 (six) hours as needed for wheezing or shortness of breath. 09/29/21  Yes Fatemah Pourciau, [place] K, DO  budesonide-formoterol (SYMBICORT) 160-4.5 MCG/ACT inhaler Inhale 2 puffs into the lungs 2 (two) times daily. 09/29/21  Yes Tyriana Helmkamp, Norman Herrlich, DO  predniSONE (DELTASONE) 10 MG tablet Take 4 tablets (40 mg total) by mouth daily for 4 days. 09/29/21 10/03/21 Yes Melayah Skorupski, Norman Herrlich, DO  acetaminophen (TYLENOL) 500 MG tablet Take 1 tablet (500 mg total) by mouth every 6 (six) hours as needed. 02/22/21   Varney Biles, MD  albuterol (PROAIR HFA) 108 (90 Base) MCG/ACT inhaler Inhale 2 puffs into the lungs every 4 (four) hours as needed for wheezing or shortness of breath. 07/25/20   Mannam, Praveen, MD  ALPRAZolam (XANAX) 1 MG tablet Take 1 tablet (1 mg total) by mouth daily as needed for anxiety. Patient taking differently: Take 0.5 mg by  mouth daily as needed for anxiety. 07/03/20   Arfeen, Arlyce Harman, MD  budesonide-formoterol (SYMBICORT) 160-4.5 MCG/ACT inhaler Inhale 2 puffs into the lungs 2 (two) times daily. 06/16/19   Martyn Ehrich, NP  montelukast (SINGULAIR) 10 MG tablet Take 1 tablet (10 mg total) by mouth at bedtime. Patient taking differently: Take 10 mg by mouth daily as needed (for allergies). 06/16/19   Martyn Ehrich, NP  sertraline (ZOLOFT) 100 MG tablet Take 1 tablet (100 mg total) by mouth daily. Patient taking differently: Take 50 mg by mouth daily. 06/27/20 02/22/22  Kathlee Nations, MD      Allergies    Pollen extract    Review of Systems   Review of Systems  Physical Exam Updated Vital Signs BP 134/87   Pulse 66   Temp 98.2 F (36.8 C) (Oral)   Resp 19   Ht '5\' 5"'$  (1.651 m)   Wt 77.1 kg   SpO2 98%   BMI 28.29 kg/m  Physical Exam Vitals and nursing note reviewed.  Constitutional:      Appearance: She is ill-appearing.     Comments: Uncomfortable appearing  HENT:     Head: Normocephalic and atraumatic.     Nose: Nose normal.     Mouth/Throat:     Mouth: Mucous membranes are moist.     Pharynx: Oropharynx is clear.  Eyes:     Extraocular Movements: Extraocular movements intact.     Conjunctiva/sclera: Conjunctivae  normal.  Cardiovascular:     Rate and Rhythm: Normal rate and regular rhythm.     Pulses: Normal pulses.     Heart sounds: Normal heart sounds.  Pulmonary:     Comments: Tachypneic, speaking in 2-3 word sentences Tight breath sounds with mild end expiratory wheezes at the bases No retractions Abdominal:     General: Abdomen is flat.     Palpations: Abdomen is soft.     Tenderness: There is no abdominal tenderness.  Musculoskeletal:        General: Normal range of motion.     Cervical back: Normal range of motion and neck supple.     Right lower leg: No edema.     Left lower leg: No edema.  Skin:    General: Skin is warm and dry.  Neurological:     General: No focal  deficit present.     Mental Status: She is alert and oriented to person, place, and time.  Psychiatric:        Mood and Affect: Mood normal.        Behavior: Behavior normal.     ED Results / Procedures / Treatments   Labs (all labs ordered are listed, but only abnormal results are displayed) Labs Reviewed  BASIC METABOLIC PANEL - Abnormal; Notable for the following components:      Result Value   Chloride 112 (*)    Glucose, Bld 100 (*)    All other components within normal limits  RESP PANEL BY RT-PCR (FLU A&B, COVID) ARPGX2  CBC WITH DIFFERENTIAL/PLATELET  I-STAT BETA HCG BLOOD, ED ([facility], [facility], [facility] ONLY)    EKG None  Radiology DG Chest Port 1 View  Result Date: 09/29/2021 CLINICAL DATA:  Shortness of breath EXAM: PORTABLE CHEST 1 VIEW COMPARISON:  Chest radiograph November 02, 2018. FINDINGS: The heart size and mediastinal contours are within normal limits. Both lungs are clear. The visualized skeletal structures are unremarkable. IMPRESSION: No acute cardiopulmonary disease. Electronically Signed   By: Dahlia Bailiff M.D.   On: 09/29/2021 08:08    Procedures Procedures    Medications Ordered in ED Medications  ondansetron (ZOFRAN-ODT) disintegrating tablet 8 mg (has no administration in time range)  ipratropium-albuterol (DUONEB) 0.5-2.5 (3) MG/3ML nebulizer solution 3 mL (3 mLs Nebulization Given 09/29/21 0826)  methylPREDNISolone sodium succinate (SOLU-MEDROL) 125 mg/2 mL injection 125 mg (125 mg Intravenous Given 09/29/21 0817)  magnesium sulfate IVPB 2 g 50 mL (0 g Intravenous Stopped 09/29/21 0940)    ED Course/ Medical Decision Making/ A&P Clinical Course as of 09/29/21 0942  Mon Sep 29, 2021  0849 Upon reassessment, the patient reports significant improvement of her symptoms.  She is well-appearing, breathing comfortably on room air.  She is speaking in full sentences.  She has good air movement throughout without any wheezing on exam.  Patient will be monitored off  the nebulizer treatments and will have an ambulatory trial prior to discharge.  Labs reviewed and interpreted by myself within normal range.  Chest x-ray without acute disease. [VK]  [id number] Patient continues to be well appearing with clear lungs on reassessment. She is stable for discharge home with primary care follow up. She was given strict return precautions. She was given a refill of her inhalers and steroid course.  [VK]    Clinical Course User Index [VK] Ottie Glazier, DO  Medical Decision Making This patient presents to the ED with chief complaint(s) of shortness of breath with pertinent past medical history of asthma which further complicates the presenting complaint. The complaint involves an extensive differential diagnosis and also carries with it a high risk of complications and morbidity.    The differential diagnosis includes acute asthma exacerbation, pneumonia or viral syndrome less likely as the patient has had no recent fevers or cough, pneumothorax unlikely as the patient has equal bilateral breath sounds, patient has no significant chest pain and does have wheezing on exam making ACS unlikely  Additional history obtained: Records reviewed Primary Care Documents  ED Course and Reassessment: Upon patient's arrival to the emergency department she is tachypneic speaking in 2-3 word sentences but with no retractions or extra respiratory muscle use, she does have tight sounding lungs with end expiratory wheeze at the bases, concerning for asthma exacerbation.  The patient will require nebs and steroids and magnesium.  She only has mild increased work of breathing and does not require BiPAP at this time but will be closely monitored for need for possible increased respiratory interventions.  Independent labs interpretation:  The following labs were independently interpreted: All labs within normal range  Independent visualization of imaging: - I  independently visualized the following imaging with scope of interpretation limited to determining acute life threatening conditions related to emergency care: Chest x-ray, which revealed no acute disease  Consultation: - Consulted or discussed management/test interpretation w/ external professional: N/A  Consideration for admission or further workup: Patient significant improvement of her shortness of breath with treatment of her asthma exacerbation and is stable for discharge home with outpatient primary care follow-up and does not require admission today Social Determinants of health: N/A    Amount and/or Complexity of Data Reviewed Labs: ordered. Radiology: ordered.  Risk Prescription drug management.           Final Clinical Impression(s) / ED Diagnoses Final diagnoses:  Moderate asthma with exacerbation, unspecified whether persistent    Rx / DC Orders ED Discharge Orders          Ordered    albuterol (VENTOLIN HFA) 108 (90 Base) MCG/ACT inhaler  Every 6 hours PRN        09/29/21 0941    budesonide-formoterol (SYMBICORT) 160-4.5 MCG/ACT inhaler  2 times daily        09/29/21 0941    predniSONE (DELTASONE) 10 MG tablet  Daily        09/29/21 Willits, [place], DO 09/29/21 [phone]

## 2021-09-29 NOTE — Discharge Instructions (Signed)
You were seen in the emergency department for your asthma exacerbation.  We gave you steroids and breathing treatments in the ER with improvement of your breathing.  You had no signs of pneumonia, your COVID test were negative.  You should continue to use your inhalers as needed.  I have also given you a short course of steroids that you should complete over the next few days.  You should follow-up with your primary doctor to have your symptoms rechecked.  You should return to the emergency department if you are having worsening shortness of breath despite taking your medications, you are developing chest pain, you have fevers, you pass out or if you have any other new or concerning symptoms.

## 2021-09-29 NOTE — ED Notes (Signed)
Pt ambulate from the room to the restroom and back 100%

## 2022-12-06 ENCOUNTER — Other Ambulatory Visit: Payer: Self-pay

## 2022-12-06 ENCOUNTER — Emergency Department (HOSPITAL_COMMUNITY): Payer: Medicaid Other

## 2022-12-06 ENCOUNTER — Emergency Department (HOSPITAL_COMMUNITY)
Admission: EM | Admit: 2022-12-06 | Discharge: 2022-12-06 | Discharge status: Against Medical Advice | Payer: Medicaid Other | Attending: Emergency Medicine | Admitting: Emergency Medicine

## 2022-12-06 ENCOUNTER — Encounter (HOSPITAL_COMMUNITY): Payer: Self-pay

## 2022-12-06 DIAGNOSIS — J45909 Unspecified asthma, uncomplicated: Secondary | ICD-10-CM | POA: Insufficient documentation

## 2022-12-06 DIAGNOSIS — Z5329 Procedure and treatment not carried out because of patient's decision for other reasons: Secondary | ICD-10-CM | POA: Insufficient documentation

## 2022-12-06 DIAGNOSIS — Z5321 Procedure and treatment not carried out due to patient leaving prior to being seen by health care provider: Secondary | ICD-10-CM

## 2022-12-06 DIAGNOSIS — R0602 Shortness of breath: Secondary | ICD-10-CM | POA: Diagnosis present

## 2022-12-06 MED ORDER — ALBUTEROL SULFATE HFA 108 (90 BASE) MCG/ACT IN AERS
2.0000 | INHALATION_SPRAY | RESPIRATORY_TRACT | Status: DC | PRN
  Administered 2022-12-06: 2 via RESPIRATORY_TRACT
  Filled 2022-12-06: qty 6.7

## 2022-12-06 MED ORDER — PREDNISONE 20 MG PO TABS
60.0000 mg | ORAL_TABLET | Freq: Once | ORAL | Status: AC
  Administered 2022-12-06: 60 mg via ORAL
  Filled 2022-12-06: qty 3

## 2022-12-06 MED ORDER — AEROCHAMBER Z-STAT PLUS/MEDIUM MISC
Status: AC
  Administered 2022-12-06: 1
  Filled 2022-12-06: qty 1

## 2022-12-06 MED ORDER — IPRATROPIUM-ALBUTEROL 0.5-2.5 (3) MG/3ML IN SOLN
3.0000 mL | Freq: Once | RESPIRATORY_TRACT | Status: AC
  Administered 2022-12-06: 3 mL via RESPIRATORY_TRACT
  Filled 2022-12-06: qty 3

## 2022-12-06 NOTE — ED Triage Notes (Signed)
Pt reports with shob and wheezing and states that her inhaler isnt working. Pt reports using her inhaler every day.

## 2022-12-06 NOTE — ED Provider Notes (Signed)
[facility] EMERGENCY DEPARTMENT AT [facility] Provider Note   CSN: [id number] Arrival date & time: 12/06/22  2011     History  Chief Complaint  Patient presents with   Shortness of Breath    [name] is a 32 y.o. female history of asthma here for evaluation of shortness of breath.  Started earlier this morning.  She feels tight across her chest.  Denies any overt pain.  Feels like she cannot take a deep breath.  She has a nonproductive cough.  She has no history of PE or DVT.  No fever, recent illnesses.  She has been using her inhaler without relief.  No pain or swelling to the lower legs.  No recent surgery, immobilization or malignancy.  No PND, orthopnea she has never been admitted for asthma previously  HPI     Home Medications Prior to Admission medications   Medication Sig Start Date End Date Taking? Authorizing Provider  acetaminophen (TYLENOL) 500 MG tablet Take 1 tablet (500 mg total) by mouth every 6 (six) hours as needed. 02/22/21   Derwood Kaplan, MD  albuterol (PROAIR HFA) 108 (90 Base) MCG/ACT inhaler Inhale 2 puffs into the lungs every 4 (four) hours as needed for wheezing or shortness of breath. 07/25/20   Mannam, Praveen, MD  albuterol (VENTOLIN HFA) 108 (90 Base) MCG/ACT inhaler Inhale 1-2 puffs into the lungs every 6 (six) hours as needed for wheezing or shortness of breath. 09/29/21   [name], DO  ALPRAZolam Prudy Feeler) 1 MG tablet Take 1 tablet (1 mg total) by mouth daily as needed for anxiety. Patient taking differently: Take 0.5 mg by mouth daily as needed for anxiety. 07/03/20   Arfeen, Phillips Grout, MD  budesonide-formoterol (SYMBICORT) 160-4.5 MCG/ACT inhaler Inhale 2 puffs into the lungs 2 (two) times daily. 06/16/19   [name], NP  budesonide-formoterol [facility]) 160-4.5 MCG/ACT inhaler Inhale 2 puffs into the lungs 2 (two) times daily. 09/29/21   [name], DO  montelukast (SINGULAIR) 10 MG tablet Take 1 tablet (10 mg  total) by mouth at bedtime. Patient taking differently: Take 10 mg by mouth daily as needed (for allergies). 06/16/19   [name], NP  sertraline (ZOLOFT) 100 MG tablet Take 1 tablet (100 mg total) by mouth daily. Patient taking differently: Take 50 mg by mouth daily. 06/27/20 02/22/22  [name], MD      Allergies    Pollen extract and Beta adrenergic blockers    Review of Systems   Review of Systems  Constitutional: Negative.   HENT: Negative.    Respiratory:  Positive for cough, chest tightness, shortness of breath and wheezing.   Cardiovascular: Negative.   Gastrointestinal: Negative.   Genitourinary: Negative.   Musculoskeletal: Negative.   Skin: Negative.   Neurological: Negative.   All other systems reviewed and are negative.   Physical Exam Updated Vital Signs BP (!) 128/95 (BP Location: Left Arm)   Pulse 79   Temp 98.6 F (37 C) (Oral)   Resp 16   Ht 5\' 5"  (1.651 m)   Wt 77.1 kg   LMP  (Approximate)   SpO2 100%   BMI 28.29 kg/m  Physical Exam Vitals and nursing note reviewed.  Constitutional:      General: She is not in acute distress.    Appearance: She is well-developed. She is not ill-appearing, toxic-appearing or diaphoretic.  HENT:     Head: Atraumatic.  Eyes:     Pupils: Pupils are  equal, round, and reactive to light.  Cardiovascular:     Rate and Rhythm: Normal rate.     Pulses: Normal pulses.     Heart sounds: Normal heart sounds.  Pulmonary:     Effort: Pulmonary effort is normal. No respiratory distress.     Breath sounds: Wheezing present.  Chest:     Chest wall: No tenderness, crepitus or edema. There is no dullness to percussion.  Abdominal:     General: Bowel sounds are normal. There is no distension.     Palpations: Abdomen is soft.  Musculoskeletal:        General: Normal range of motion.     Cervical back: Normal range of motion.     Right lower leg: No tenderness. No edema.     Left lower leg: No tenderness. No edema.   Skin:    General: Skin is warm and dry.  Neurological:     General: No focal deficit present.     Mental Status: She is alert.  Psychiatric:        Mood and Affect: Mood normal.     ED Results / Procedures / Treatments   Labs (all labs ordered are listed, but only abnormal results are displayed) Labs Reviewed  TROPONIN I (HIGH SENSITIVITY)    EKG EKG Interpretation Date/Time:  Sunday December 06 2022 20:17:36 EST Ventricular Rate:  83 PR Interval:  170 QRS Duration:  83 QT Interval:  388 QTC Calculation: 456 R Axis:   114  Text Interpretation: Sinus rhythm Right axis deviation Confirmed by Elayne Snare ([id number]) on 12/06/2022 8:44:35 PM  Radiology DG Chest 2 View  Result Date: 12/06/2022 CLINICAL DATA:  Dyspnea, wheezing EXAM: CHEST - 2 VIEW COMPARISON:  09/29/2021 FINDINGS: The heart size and mediastinal contours are within normal limits. Both lungs are clear. The visualized skeletal structures are unremarkable. IMPRESSION: No active cardiopulmonary disease. Electronically Signed   By: Helyn Numbers M.D.   On: 12/06/2022 20:31    Procedures Procedures    Medications Ordered in ED Medications  albuterol (VENTOLIN HFA) 108 (90 Base) MCG/ACT inhaler 2 puff (2 puffs Inhalation Given 12/06/22 2059)  predniSONE (DELTASONE) tablet 60 mg (60 mg Oral Given 12/06/22 2059)  ipratropium-albuterol (DUONEB) 0.5-2.5 (3) MG/3ML nebulizer solution 3 mL (3 mLs Nebulization Given 12/06/22 2059)  aerochamber Z-Stat Plus/medium (1 each  Given 12/06/22 2108)   ED Course/ Medical Decision Making/ A&P   32 year old history of asthma here for evaluation of shortness of breath which began earlier today.  Associated cough and chest tightness.  She is afebrile, nonseptic, not ill-appearing.  No clinical evidence of VTE on exam.  She has very minimal wheeze on exam however appears to be more short of breath than her lung exam.  She does not want any IV steroids but is agreeable for prednisone.   Will give DuoNeb.  Will also plan on checking labs look for other source of shortness of breath  Labs and imaging personally viewed and interpreted:  EKG without ischemic changes Chest x-ray without pneumonia, pneumothorax, infiltrates  Nursing notified me that patient subsequently eloped from the emergency department after prednisone her breathing treatment.  I did not get to reassess patient after her treatments and labs were not obtained as patient eloped prior to this.                                 Medical Decision Making Amount and/or  Complexity of Data Reviewed External Data Reviewed: labs, radiology, ECG and notes. Labs: ordered. Decision-making details documented in ED Course. Radiology: ordered and independent interpretation performed. Decision-making details documented in ED Course. ECG/medicine tests: ordered and independent interpretation performed. Decision-making details documented in ED Course.  Risk OTC drugs. Prescription drug management. Parenteral controlled substances. Decision regarding hospitalization. Diagnosis or treatment significantly limited by social determinants of health.           Final Clinical Impression(s) / ED Diagnoses Final diagnoses:  SOB (shortness of breath)  Eloped from emergency department    Rx / DC Orders ED Discharge Orders     None         Zhania Shaheen A, PA-C 12/06/22 2147    [name], DO 12/07/22 0013

## 2023-02-16 ENCOUNTER — Other Ambulatory Visit: Payer: Self-pay

## 2023-02-16 ENCOUNTER — Encounter (HOSPITAL_COMMUNITY): Payer: Self-pay

## 2023-02-16 ENCOUNTER — Emergency Department (HOSPITAL_COMMUNITY)
Admission: EM | Admit: 2023-02-16 | Discharge: 2023-02-17 | Disposition: A | Discharge status: Home / Self Care | Payer: Medicaid Other

## 2023-02-16 DIAGNOSIS — J45909 Unspecified asthma, uncomplicated: Secondary | ICD-10-CM | POA: Insufficient documentation

## 2023-02-16 DIAGNOSIS — N938 Other specified abnormal uterine and vaginal bleeding: Secondary | ICD-10-CM | POA: Insufficient documentation

## 2023-02-16 DIAGNOSIS — N390 Urinary tract infection, site not specified: Secondary | ICD-10-CM | POA: Insufficient documentation

## 2023-02-16 DIAGNOSIS — N939 Abnormal uterine and vaginal bleeding, unspecified: Secondary | ICD-10-CM | POA: Diagnosis present

## 2023-02-16 NOTE — ED Triage Notes (Signed)
 Lower abdominal pain and vaginal bleeding x 3 weeks.   Patient says cramping has became unbearable.   Pt somewhat drowsy and fatigued.

## 2023-02-16 NOTE — ED Provider Triage Note (Signed)
 Emergency Medicine Provider Triage Evaluation Note  [name] , a 33 y.o. female  was evaluated in triage.  Pt complains of vaginal bleeding.  Patient states she has been bleeding for a very long time.  She has been having some lower abdominal cramping.  She has not any birth control.  Denies chance of pregnancy however states she is not doing anything to prevent pregnancy either.  She has some lightheadedness feels fatigued.  No fever, dysuria, nausea, vomiting, change in bowel movements.  Review of Systems  Positive: Lower abd pain, vaginal bleeding Negative:   Physical Exam  BP 118/79 (BP Location: Right Arm)   Pulse 81   Temp 98.4 F (36.9 C) (Oral)   Resp 16   Ht 5' 5 (1.651 m)   Wt 70.3 kg   LMP  (LMP Unknown)   SpO2 100%   BMI 25.79 kg/m  Gen:   Awake, no distress   Resp:  Normal effort  MSK:   Moves extremities without difficulty  Other:    Medical Decision Making  Medically screening exam initiated at 11:20 PM.  Appropriate orders placed.  Pam Montanye was informed that the remainder of the evaluation will be completed by another provider, this initial triage assessment does not replace that evaluation, and the importance of remaining in the ED until their evaluation is complete.  Vaginal bleeding   Jaryan Chicoine A, PA-C 02/16/23 2321

## 2023-02-17 ENCOUNTER — Emergency Department (HOSPITAL_COMMUNITY): Payer: Medicaid Other

## 2023-02-17 LAB — CBC WITH DIFFERENTIAL/PLATELET
Abs Immature Granulocytes: 0.03 10*3/uL (ref 0.00–0.07)
Basophils Absolute: 0 10*3/uL (ref 0.0–0.1)
Basophils Relative: 0 %
Eosinophils Absolute: 0.3 10*3/uL (ref 0.0–0.5)
Eosinophils Relative: 3 %
HCT: 40.6 % (ref 36.0–46.0)
Hemoglobin: 12.8 g/dL (ref 12.0–15.0)
Immature Granulocytes: 0 %
Lymphocytes Relative: 9 %
Lymphs Abs: 0.6 10*3/uL — ABNORMAL LOW (ref 0.7–4.0)
MCH: 27.8 pg (ref 26.0–34.0)
MCHC: 31.5 g/dL (ref 30.0–36.0)
MCV: 88.3 fL (ref 80.0–100.0)
Monocytes Absolute: 0.4 10*3/uL (ref 0.1–1.0)
Monocytes Relative: 6 %
Neutro Abs: 6 10*3/uL (ref 1.7–7.7)
Neutrophils Relative %: 82 %
Platelets: 263 10*3/uL (ref 150–400)
RBC: 4.6 MIL/uL (ref 3.87–5.11)
RDW: 16.1 % — ABNORMAL HIGH (ref 11.5–15.5)
WBC: 7.3 10*3/uL (ref 4.0–10.5)
nRBC: 0 % (ref 0.0–0.2)

## 2023-02-17 LAB — COMPREHENSIVE METABOLIC PANEL
ALT: 37 U/L (ref 0–44)
AST: 37 U/L (ref 15–41)
Albumin: 4.6 g/dL (ref 3.5–5.0)
Alkaline Phosphatase: 45 U/L (ref 38–126)
Anion gap: 8 (ref 5–15)
BUN: 11 mg/dL (ref 6–20)
CO2: 25 mmol/L (ref 22–32)
Calcium: 9.3 mg/dL (ref 8.9–10.3)
Chloride: 105 mmol/L (ref 98–111)
Creatinine, Ser: 0.83 mg/dL (ref 0.44–1.00)
GFR, Estimated: >60 mL/min (ref >60)
Glucose, Bld: 90 mg/dL (ref 70–99)
Potassium: 3.6 mmol/L (ref 3.5–5.1)
Sodium: 138 mmol/L (ref 135–145)
Total Bilirubin: 1.3 mg/dL — ABNORMAL HIGH (ref 0.0–1.2)
Total Protein: 8 g/dL (ref 6.5–8.1)

## 2023-02-17 LAB — URINALYSIS, ROUTINE W REFLEX MICROSCOPIC
Bacteria, UA: NONE SEEN
Glucose, UA: NEGATIVE mg/dL
Ketones, ur: NEGATIVE mg/dL
Leukocytes,Ua: NEGATIVE
Nitrite: NEGATIVE
Protein, ur: 100 mg/dL — AB
RBC / HPF: >50 RBC/hpf (ref 0–5)
Specific Gravity, Urine: 1.039 — ABNORMAL HIGH (ref 1.005–1.030)
pH: 5 (ref 5.0–8.0)

## 2023-02-17 LAB — TYPE AND SCREEN
ABO/RH(D): B POS
Antibody Screen: NEGATIVE

## 2023-02-17 LAB — PREGNANCY, URINE: Preg Test, Ur: NEGATIVE

## 2023-02-17 LAB — LIPASE, BLOOD: Lipase: 33 U/L (ref 11–51)

## 2023-02-17 MED ORDER — CEPHALEXIN 500 MG PO CAPS: 500.0000 mg | ORAL_CAPSULE | Freq: Four times a day (QID) | ORAL | 0 refills | Status: AC

## 2023-02-17 MED ORDER — NAPROXEN 500 MG PO TABS: 500.0000 mg | ORAL_TABLET | Freq: Two times a day (BID) | ORAL | 0 refills | Status: AC | PRN

## 2023-02-17 NOTE — ED Notes (Signed)
 Pt asked for meds stating she is dizzy and in a lot of pain, nurse notified

## 2023-02-17 NOTE — ED Notes (Signed)
 Patient transported to Ultrasound

## 2023-02-17 NOTE — Discharge Instructions (Signed)
 Please follow-up closely with OB/GYN for further evaluation of your irregular menstruation.  Take Keflex  as needed for UTI.

## 2023-02-17 NOTE — ED Provider Notes (Addendum)
 [place] EMERGENCY DEPARTMENT AT [facility] Provider Note   CSN: [id number] Arrival date & time: 02/16/23  2234     History  Chief Complaint  Patient presents with   Vaginal Bleeding    [name] is a 33 y.o. female.  The history is provided by the patient and medical records. No language interpreter was used.  Vaginal Bleeding    33 year old female with significant history of polysubstance abuse, GERD, asthma, anxiety, prior STI presenting with complaint of vaginal bleeding.  Patient states since November she has had irregular menstruation.  States she would go through long period of heavy bleeding follows with vaginal spotting.  For the past 2 days she has had heavy menstruation again.  She endorsed bouts of lightheadedness and dizziness and feeling fatigued.  She noted some mild increase in urinary frequency.  She does not endorse any fever or chills no vaginal discharge no odor no new sexual partner.  She was told that she has uterine fibroid in the past.  She does not have an OB/GYN.  She was worried that this could be related to hormones.  Home Medications Prior to Admission medications   Medication Sig Start Date End Date Taking? Authorizing Provider  acetaminophen  (TYLENOL ) 500 MG tablet Take 1 tablet (500 mg total) by mouth every 6 (six) hours as needed. 02/22/21   Deatra Face, MD  albuterol  (PROAIR  HFA) 108 (90 Base) MCG/ACT inhaler Inhale 2 puffs into the lungs every 4 (four) hours as needed for wheezing or shortness of breath. 07/25/20   Mannam, Praveen, MD  albuterol  (VENTOLIN  HFA) 108 (90 Base) MCG/ACT inhaler Inhale 1-2 puffs into the lungs every 6 (six) hours as needed for wheezing or shortness of breath. 09/29/21   Kingsley, Victoria K, DO  ALPRAZolam  (XANAX ) 1 MG tablet Take 1 tablet (1 mg total) by mouth daily as needed for anxiety. Patient taking differently: Take 0.5 mg by mouth daily as needed for anxiety. 07/03/20   Arfeen, Bronson Canny, MD   budesonide -formoterol  (SYMBICORT ) 160-4.5 MCG/ACT inhaler Inhale 2 puffs into the lungs 2 (two) times daily. 06/16/19   Antonio Baumgarten, NP  budesonide -formoterol  (SYMBICORT ) 160-4.5 MCG/ACT inhaler Inhale 2 puffs into the lungs 2 (two) times daily. 09/29/21   Kingsley, Victoria K, DO  montelukast  (SINGULAIR ) 10 MG tablet Take 1 tablet (10 mg total) by mouth at bedtime. Patient taking differently: Take 10 mg by mouth daily as needed (for allergies). 06/16/19   Antonio Baumgarten, NP  sertraline  (ZOLOFT ) 100 MG tablet Take 1 tablet (100 mg total) by mouth daily. Patient taking differently: Take 50 mg by mouth daily. 06/27/20 02/22/22  Arfeen, Syed T, MD      Allergies    Pollen extract and Beta adrenergic blockers    Review of Systems   Review of Systems  Genitourinary:  Positive for vaginal bleeding.  All other systems reviewed and are negative.   Physical Exam Updated Vital Signs BP 115/75   Pulse 83   Temp 97.7 F (36.5 C) (Oral)   Resp 18   Ht 5\' 5"  (1.651 m)   Wt 70.3 kg   LMP  (LMP Unknown)   SpO2 100%   BMI 25.79 kg/m  Physical Exam Vitals and nursing note reviewed.  Constitutional:      General: She is not in acute distress.    Appearance: She is well-developed.  HENT:     Head: Atraumatic.  Eyes:     Conjunctiva/sclera: Conjunctivae normal.  Cardiovascular:  Rate and Rhythm: Normal rate and regular rhythm.     Pulses: Normal pulses.     Heart sounds: Normal heart sounds.  Pulmonary:     Effort: Pulmonary effort is normal.  Abdominal:     Palpations: Abdomen is soft.     Tenderness: There is abdominal tenderness (Tenderness to suprapubic region on palpation).  Musculoskeletal:     Cervical back: Neck supple.  Skin:    Findings: No rash.  Neurological:     Mental Status: She is alert.  Psychiatric:        Mood and Affect: Mood normal.     ED Results / Procedures / Treatments   Labs (all labs ordered are listed, but only abnormal results are  displayed) Labs Reviewed  COMPREHENSIVE METABOLIC PANEL - Abnormal; Notable for the following components:      Result Value   Total Bilirubin 1.3 (*)    All other components within normal limits  URINALYSIS, ROUTINE W REFLEX MICROSCOPIC - Abnormal; Notable for the following components:   Color, Urine AMBER (*)    APPearance CLOUDY (*)    Specific Gravity, Urine 1.039 (*)    Hgb urine dipstick LARGE (*)    Bilirubin Urine SMALL (*)    Protein, ur 100 (*)    All other components within normal limits  CBC WITH DIFFERENTIAL/PLATELET - Abnormal; Notable for the following components:   RDW 16.1 (*)    Lymphs Abs 0.6 (*)    All other components within normal limits  LIPASE, BLOOD  PREGNANCY, URINE  TYPE AND SCREEN    EKG None  Radiology US  Pelvis Complete Result Date: 02/17/2023 CLINICAL DATA:  33 year old female with history of left-sided pelvic pain for the past 6 months. EXAM: TRANSABDOMINAL AND TRANSVAGINAL ULTRASOUND OF PELVIS DOPPLER ULTRASOUND OF OVARIES TECHNIQUE: Both transabdominal and transvaginal ultrasound examinations of the pelvis were performed. Transabdominal technique was performed for global imaging of the pelvis including uterus, ovaries, adnexal regions, and pelvic cul-de-sac. It was necessary to proceed with endovaginal exam following the transabdominal exam to visualize the adnexal regions. Color and duplex Doppler ultrasound was utilized to evaluate blood flow to the ovaries. COMPARISON:  None Available. FINDINGS: Uterus Measurements: 10.0 x 7.6 x 7.9 cm = volume: 309.9 mL. In the posterior aspect of the uterine fundus slightly to the right of midline is a 3.4 x 3.5 x 3.2 cm heterogeneously echogenic lesion, most compatible with a fibroid. Endometrium Thickness: 7.2 mm.  No focal abnormality visualized. Right ovary Measurements: 3.5 x 1.8 x 2.6 cm = volume: 8.8 mL. Normal appearance/no adnexal mass. Left ovary Measurements: 2.8 x 1.8 x 2.5 cm = volume: 6.3 mL. Normal  appearance/no adnexal mass. Pulsed Doppler evaluation of both ovaries demonstrates normal low-resistance arterial and venous waveforms. Other findings Small volume of free fluid in the cul-de-sac. IMPRESSION: 1. No acute findings to account for the patient's symptoms. 2. Small fibroid in the uterine fundus, as above. Electronically Signed   By: Alexandria Angel M.D.   On: 02/17/2023 06:15   US  Transvaginal Non-OB Result Date: 02/17/2023 CLINICAL DATA:  33 year old female with history of left-sided pelvic pain for the past 6 months. EXAM: TRANSABDOMINAL AND TRANSVAGINAL ULTRASOUND OF PELVIS DOPPLER ULTRASOUND OF OVARIES TECHNIQUE: Both transabdominal and transvaginal ultrasound examinations of the pelvis were performed. Transabdominal technique was performed for global imaging of the pelvis including uterus, ovaries, adnexal regions, and pelvic cul-de-sac. It was necessary to proceed with endovaginal exam following the transabdominal exam to visualize the adnexal regions.  Color and duplex Doppler ultrasound was utilized to evaluate blood flow to the ovaries. COMPARISON:  None Available. FINDINGS: Uterus Measurements: 10.0 x 7.6 x 7.9 cm = volume: 309.9 mL. In the posterior aspect of the uterine fundus slightly to the right of midline is a 3.4 x 3.5 x 3.2 cm heterogeneously echogenic lesion, most compatible with a fibroid. Endometrium Thickness: 7.2 mm.  No focal abnormality visualized. Right ovary Measurements: 3.5 x 1.8 x 2.6 cm = volume: 8.8 mL. Normal appearance/no adnexal mass. Left ovary Measurements: 2.8 x 1.8 x 2.5 cm = volume: 6.3 mL. Normal appearance/no adnexal mass. Pulsed Doppler evaluation of both ovaries demonstrates normal low-resistance arterial and venous waveforms. Other findings Small volume of free fluid in the cul-de-sac. IMPRESSION: 1. No acute findings to account for the patient's symptoms. 2. Small fibroid in the uterine fundus, as above. Electronically Signed   By: Alexandria Angel M.D.    On: 02/17/2023 06:15   US  Art/Ven Flow Abd Pelv Doppler Result Date: 02/17/2023 CLINICAL DATA:  33 year old female with history of left-sided pelvic pain for the past 6 months. EXAM: TRANSABDOMINAL AND TRANSVAGINAL ULTRASOUND OF PELVIS DOPPLER ULTRASOUND OF OVARIES TECHNIQUE: Both transabdominal and transvaginal ultrasound examinations of the pelvis were performed. Transabdominal technique was performed for global imaging of the pelvis including uterus, ovaries, adnexal regions, and pelvic cul-de-sac. It was necessary to proceed with endovaginal exam following the transabdominal exam to visualize the adnexal regions. Color and duplex Doppler ultrasound was utilized to evaluate blood flow to the ovaries. COMPARISON:  None Available. FINDINGS: Uterus Measurements: 10.0 x 7.6 x 7.9 cm = volume: 309.9 mL. In the posterior aspect of the uterine fundus slightly to the right of midline is a 3.4 x 3.5 x 3.2 cm heterogeneously echogenic lesion, most compatible with a fibroid. Endometrium Thickness: 7.2 mm.  No focal abnormality visualized. Right ovary Measurements: 3.5 x 1.8 x 2.6 cm = volume: 8.8 mL. Normal appearance/no adnexal mass. Left ovary Measurements: 2.8 x 1.8 x 2.5 cm = volume: 6.3 mL. Normal appearance/no adnexal mass. Pulsed Doppler evaluation of both ovaries demonstrates normal low-resistance arterial and venous waveforms. Other findings Small volume of free fluid in the cul-de-sac. IMPRESSION: 1. No acute findings to account for the patient's symptoms. 2. Small fibroid in the uterine fundus, as above. Electronically Signed   By: Alexandria Angel M.D.   On: 02/17/2023 06:15    Procedures Procedures    Medications Ordered in ED Medications - No data to display  ED Course/ Medical Decision Making/ A&P                                 Medical Decision Making Amount and/or Complexity of Data Reviewed Labs: ordered.  Risk Prescription drug management.   BP 115/75   Pulse 83   Temp 97.7 F  (36.5 C) (Oral)   Resp 18   Ht 5\' 5"  (1.651 m)   Wt 70.3 kg   LMP  (LMP Unknown)   SpO2 100%   BMI 25.79 kg/m   63:47 AM 33 year old female with significant history of polysubstance abuse, GERD, asthma, anxiety, prior STI presenting with complaint of vaginal bleeding.  Patient states since November she has had irregular menstruation.  States she would go through long period of heavy bleeding follows with vaginal spotting.  For the past 2 days she has had heavy menstruation again.  She endorsed bouts of lightheadedness and dizziness and feeling fatigued.  She noted some mild increase in urinary frequency.  She does not endorse any fever or chills no vaginal discharge no odor no new sexual partner.  She was told that she has uterine fibroid in the past.  She does not have an OB/GYN.  She was worried that this could be related to hormones.  Exam notable for tenderness to suprapubic region no guarding no rebound tenderness patient overall well-appearing.  Vital signs is normal and stable.  -Labs ordered, independently viewed and interpreted by me.  Labs remarkable for UA concerning for UTI.  Preg test negative, labs are reassuring -The patient was maintained on a cardiac monitor.  I personally viewed and interpreted the cardiac monitored which showed an underlying rhythm of: NSR -Imaging independently viewed and interpreted by me and I agree with radiologist's interpretation.  Result remarkable for transvaginal US  without acute abnormality. Small uterine fibroid noted -This patient presents to the ED for concern of vaginal bleeding, this involves an extensive number of treatment options, and is a complaint that carries with it a high risk of complications and morbidity.  The differential diagnosis includes menorrhagia, PID, uterine fibroid, endometriosis, vaginal tear, trauma, infection -Co morbidities that complicate the patient evaluation includes uterine fibroid -Treatment includes  reassurance -Reevaluation of the patient after these medicines showed that the patient stayed the same -PCP office notes or outside notes reviewed -Escalation to admission/observation considered: patients feels much better, is comfortable with discharge, and will follow up with OBGYN -Prescription medication considered, patient comfortable with keflex  for UTI -Social Determinant of Health considered which includes tobacco use  8:35 AM Will treat patient for UTI with antibiotic.  Will have patient follow-up closely with OB/GYN for outpatient evaluation of her menorrhagia.  Offered pelvic examination to rule out PID or trauma causing her bleeding.  Patient declined stating that she recently had an STI check.         Final Clinical Impression(s) / ED Diagnoses Final diagnoses:  Dysfunctional uterine bleeding  Acute lower UTI    Rx / DC Orders ED Discharge Orders          Ordered    cephALEXin  (KEFLEX ) 500 MG capsule  4 times daily        02/17/23 0836              Debbra Fairy, PA-C 02/17/23 0837    Debbra Fairy, PA-C 02/17/23 1610    Carin Charleston, MD 02/17/23 1315

## 2023-05-07 ENCOUNTER — Encounter: Payer: Medicaid Other | Admitting: Obstetrics and Gynecology

## 2023-08-01 ENCOUNTER — Encounter (HOSPITAL_COMMUNITY): Payer: Self-pay

## 2023-08-01 ENCOUNTER — Other Ambulatory Visit: Payer: Self-pay

## 2023-08-01 ENCOUNTER — Emergency Department (HOSPITAL_COMMUNITY)
Admission: EM | Admit: 2023-08-01 | Discharge: 2023-08-01 | Disposition: A | Discharge status: Home / Self Care | Attending: Emergency Medicine | Admitting: Emergency Medicine

## 2023-08-01 DIAGNOSIS — R112 Nausea with vomiting, unspecified: Secondary | ICD-10-CM | POA: Diagnosis present

## 2023-08-01 DIAGNOSIS — J45909 Unspecified asthma, uncomplicated: Secondary | ICD-10-CM | POA: Insufficient documentation

## 2023-08-01 DIAGNOSIS — R197 Diarrhea, unspecified: Secondary | ICD-10-CM | POA: Diagnosis not present

## 2023-08-01 LAB — BASIC METABOLIC PANEL WITH GFR
Anion gap: 10 (ref 5–15)
BUN: 7 mg/dL (ref 6–20)
CO2: 23 mmol/L (ref 22–32)
Calcium: 9.4 mg/dL (ref 8.9–10.3)
Chloride: 106 mmol/L (ref 98–111)
Creatinine, Ser: 0.8 mg/dL (ref 0.44–1.00)
GFR, Estimated: >60 mL/min (ref >60)
Glucose, Bld: 111 mg/dL — ABNORMAL HIGH (ref 70–99)
Potassium: 3.9 mmol/L (ref 3.5–5.1)
Sodium: 139 mmol/L (ref 135–145)

## 2023-08-01 LAB — CBC WITH DIFFERENTIAL/PLATELET
Abs Immature Granulocytes: 0.02 10*3/uL (ref 0.00–0.07)
Basophils Absolute: 0 10*3/uL (ref 0.0–0.1)
Basophils Relative: 0 %
Eosinophils Absolute: 0.1 10*3/uL (ref 0.0–0.5)
Eosinophils Relative: 1 %
HCT: 33.2 % — ABNORMAL LOW (ref 36.0–46.0)
Hemoglobin: 9.8 g/dL — ABNORMAL LOW (ref 12.0–15.0)
Immature Granulocytes: 0 %
Lymphocytes Relative: 15 %
Lymphs Abs: 1 10*3/uL (ref 0.7–4.0)
MCH: 21.8 pg — ABNORMAL LOW (ref 26.0–34.0)
MCHC: 29.5 g/dL — ABNORMAL LOW (ref 30.0–36.0)
MCV: 73.8 fL — ABNORMAL LOW (ref 80.0–100.0)
Monocytes Absolute: 0.2 10*3/uL (ref 0.1–1.0)
Monocytes Relative: 4 %
Neutro Abs: 5.1 10*3/uL (ref 1.7–7.7)
Neutrophils Relative %: 80 %
Platelets: 359 10*3/uL (ref 150–400)
RBC: 4.5 MIL/uL (ref 3.87–5.11)
RDW: 18.6 % — ABNORMAL HIGH (ref 11.5–15.5)
WBC: 6.4 10*3/uL (ref 4.0–10.5)
nRBC: 0 % (ref 0.0–0.2)

## 2023-08-01 LAB — URINALYSIS, ROUTINE W REFLEX MICROSCOPIC
Bacteria, UA: NONE SEEN
Bilirubin Urine: NEGATIVE
Glucose, UA: NEGATIVE mg/dL
Hgb urine dipstick: NEGATIVE
Ketones, ur: 5 mg/dL — AB
Nitrite: NEGATIVE
Protein, ur: NEGATIVE mg/dL
Specific Gravity, Urine: 1.015 (ref 1.005–1.030)
pH: 8 (ref 5.0–8.0)

## 2023-08-01 LAB — HEPATIC FUNCTION PANEL
ALT: 15 U/L (ref 0–44)
AST: 21 U/L (ref 15–41)
Albumin: 4 g/dL (ref 3.5–5.0)
Alkaline Phosphatase: 39 U/L (ref 38–126)
Bilirubin, Direct: <0.1 mg/dL (ref 0.0–0.2)
Total Bilirubin: 0.4 mg/dL (ref 0.0–1.2)
Total Protein: 7.4 g/dL (ref 6.5–8.1)

## 2023-08-01 LAB — HCG, SERUM, QUALITATIVE: Preg, Serum: NEGATIVE

## 2023-08-01 LAB — LIPASE, BLOOD: Lipase: 32 U/L (ref 11–51)

## 2023-08-01 MED ORDER — ONDANSETRON HCL 4 MG/2ML IJ SOLN
4.0000 mg | Freq: Once | INTRAMUSCULAR | Status: AC
  Administered 2023-08-01: 4 mg via INTRAVENOUS
  Filled 2023-08-01: qty 2

## 2023-08-01 MED ORDER — LACTATED RINGERS IV BOLUS
1000.0000 mL | Freq: Once | INTRAVENOUS | Status: AC
  Administered 2023-08-01: 1000 mL via INTRAVENOUS

## 2023-08-01 MED ORDER — ONDANSETRON 4 MG PO TBDP
4.0000 mg | ORAL_TABLET | Freq: Three times a day (TID) | ORAL | 0 refills | Status: AC | PRN
Start: 2023-08-01 — End: ?

## 2023-08-01 NOTE — Discharge Instructions (Signed)
 Use Zofran  to control nausea if this should recur. Tylenol  for any aches or if you develop any fever.   Follow up with your doctor for recheck if symptoms persist. Return to the ED with any new or worsening symptoms.

## 2023-08-01 NOTE — ED Triage Notes (Signed)
 BIBA from home, reports dizzyness and weakness, some nausea for 2 days.  Has been on a no carb diet for over 2 months  108/64 HR 88 O2 94% ra RR 18/22 CBG 146

## 2023-08-01 NOTE — ED Provider Notes (Signed)
  Physical Exam  BP 121/86 (BP Location: Left Arm)   Pulse 81   Temp 98.1 F (36.7 C) (Oral)   Resp 18   Ht 5' 5 (1.651 m)   Wt 68 kg   SpO2 100%   BMI 24.96 kg/m   Physical Exam  Procedures  Procedures  ED Course / MDM    Medical Decision Making Amount and/or Complexity of Data Reviewed Labs: ordered.  Risk Prescription drug management.   N, V, D x 48 hours, no fever Urinary symptoms - pending urine collection Hgb down 3 grams in one month  Plan: evaluate urine, complete fluids, PO challenge Anticipate discharge  UA with 21-50 WBC, squamous cells presents (contaminated) No bacteria.   She is requesting discharge. Feels better. She is tolerating fluids.   Will Rx Zofran . Recommend PCP follow up.        Odell Balls, PA-C 08/01/23 9243    Palumbo, April, MD 08/01/23 2300

## 2023-08-01 NOTE — ED Provider Notes (Signed)
 [place] EMERGENCY DEPARTMENT AT [facility] Provider Note   CSN: [id number] Arrival date & time: 08/01/23  9749     Patient presents with: Dizziness   Yanissa Michalsky is a 33 y.o. female who presents with 48 hours of nausea vomiting diarrhea, several episodes of NBNB emesis and loose watery stool, denies melena, denies abdominal pain.  No known ill contacts. She denies any new medications at this time.  Patient with heavy menstrual periods most recently 2 weeks ago per patient. To recreational substance use.  History of asthma GERD, depression, anxiety, anemia.   HPI     Prior to Admission medications   Medication Sig Start Date End Date Taking? Authorizing Provider  acetaminophen  (TYLENOL ) 500 MG tablet Take 1 tablet (500 mg total) by mouth every 6 (six) hours as needed. 02/22/21   Charlyn Sora, MD  albuterol  (PROAIR  HFA) 108 (90 Base) MCG/ACT inhaler Inhale 2 puffs into the lungs every 4 (four) hours as needed for wheezing or shortness of breath. 07/25/20   Mannam, Praveen, MD  albuterol  (VENTOLIN  HFA) 108 (90 Base) MCG/ACT inhaler Inhale 1-2 puffs into the lungs every 6 (six) hours as needed for wheezing or shortness of breath. 09/29/21   Kingsley, Victoria K, DO  ALPRAZolam  (XANAX ) 1 MG tablet Take 1 tablet (1 mg total) by mouth daily as needed for anxiety. Patient taking differently: Take 0.5 mg by mouth daily as needed for anxiety. 07/03/20   Arfeen, Leni DASEN, MD  budesonide -formoterol  (SYMBICORT ) 160-4.5 MCG/ACT inhaler Inhale 2 puffs into the lungs 2 (two) times daily. 06/16/19   Hope Almarie ORN, NP  budesonide -formoterol  (SYMBICORT ) 160-4.5 MCG/ACT inhaler Inhale 2 puffs into the lungs 2 (two) times daily. 09/29/21   Kingsley, Victoria K, DO  cephALEXin  (KEFLEX ) 500 MG capsule Take 1 capsule (500 mg total) by mouth 4 (four) times daily. 02/17/23   Nivia Colon, PA-C  montelukast  (SINGULAIR ) 10 MG tablet Take 1 tablet (10 mg total) by mouth at bedtime. Patient taking  differently: Take 10 mg by mouth daily as needed (for allergies). 06/16/19   Hope Almarie ORN, NP  naproxen  (NAPROSYN ) 500 MG tablet Take 1 tablet (500 mg total) by mouth 2 (two) times daily as needed for mild pain (pain score 1-3) or moderate pain (pain score 4-6). 02/17/23   Nivia Colon, PA-C  sertraline  (ZOLOFT ) 100 MG tablet Take 1 tablet (100 mg total) by mouth daily. Patient taking differently: Take 50 mg by mouth daily. 06/27/20 02/22/22  Arfeen, Syed T, MD    Allergies: Pollen extract and Beta adrenergic blockers    Review of Systems  Constitutional:  Positive for appetite change and fatigue. Negative for chills and fever.  HENT: Negative.    Respiratory: Negative.    Cardiovascular: Negative.   Gastrointestinal:  Positive for diarrhea, nausea and vomiting. Negative for abdominal pain.  Genitourinary: Negative.   Musculoskeletal: Negative.   Skin: Negative.   Neurological: Negative.     Updated Vital Signs BP 121/86 (BP Location: Left Arm)   Pulse 81   Temp 98.1 F (36.7 C) (Oral)   Resp 18   Ht 5' 5 (1.651 m)   Wt 68 kg   SpO2 100%   BMI 24.96 kg/m   Physical Exam Vitals and nursing note reviewed.  Constitutional:      Appearance: She is not ill-appearing or toxic-appearing.  HENT:     Head: Normocephalic and atraumatic.     Mouth/Throat:     Mouth: Mucous membranes are moist.  Pharynx: No oropharyngeal exudate or posterior oropharyngeal erythema.   Eyes:     General:        Right eye: No discharge.        Left eye: No discharge.     Conjunctiva/sclera: Conjunctivae normal.     Pupils: Pupils are equal, round, and reactive to light.    Cardiovascular:     Rate and Rhythm: Normal rate and regular rhythm.     Pulses: Normal pulses.     Heart sounds: Normal heart sounds. No murmur heard. Pulmonary:     Effort: Pulmonary effort is normal. No respiratory distress.     Breath sounds: Normal breath sounds. No wheezing or rales.  Abdominal:     General:  Bowel sounds are normal. There is no distension.     Palpations: Abdomen is soft.     Tenderness: There is no abdominal tenderness. There is no right CVA tenderness, left CVA tenderness, guarding or rebound.     Comments: Vomiting NBNB emesis    Musculoskeletal:        General: No deformity.     Cervical back: Neck supple.     Right lower leg: No edema.     Left lower leg: No edema.   Skin:    General: Skin is warm and dry.     Capillary Refill: Capillary refill takes less than 2 seconds.   Neurological:     General: No focal deficit present.     Mental Status: She is alert and oriented to person, place, and time. Mental status is at baseline.   Psychiatric:        Mood and Affect: Mood normal.     (all labs ordered are listed, but only abnormal results are displayed) Labs Reviewed  CBC WITH DIFFERENTIAL/PLATELET - Abnormal; Notable for the following components:      Result Value   Hemoglobin 9.8 (*)    HCT 33.2 (*)    MCV 73.8 (*)    MCH 21.8 (*)    MCHC 29.5 (*)    RDW 18.6 (*)    All other components within normal limits  BASIC METABOLIC PANEL WITH GFR - Abnormal; Notable for the following components:   Glucose, Bld 111 (*)    All other components within normal limits  HEPATIC FUNCTION PANEL  LIPASE, BLOOD  HCG, SERUM, QUALITATIVE  URINALYSIS, ROUTINE W REFLEX MICROSCOPIC    EKG: None  Radiology: No results found.   Procedures   Medications Ordered in the ED  ondansetron  (ZOFRAN ) injection 4 mg (4 mg Intravenous Given 08/01/23 0512)  lactated ringers  bolus 1,000 mL (0 mLs Intravenous Stopped 08/01/23 9297)                                    Medical Decision Making 33 y/o female with N/V/D.   Normal vitals on intake, cardiopulmonary and abdominal exams are benign. No CVAT. Vomiting at the time of my arrival to the bedside.   DDx includes but is not limited to viral gastroenteritis, intra-abdominal infection, drug effect, urinary tract infection,  pregnancy  Amount and/or Complexity of Data Reviewed Labs: ordered.    Details:  CBC with anemia with hemoglobin 9.8 down from patient's baseline of 12 5 months ago.  BMP unremarkable hepatic function panel is normal, patient is not pregnant.   Risk Prescription drug management.    Clinic picture most consistent with gastroenteritis likely viral, however  patient pending labs at time of shift change. Care of this patient signed out to oncoming ED provider Margit Paris, PA-C at time of shift change. All pertinent HPI, physical exam, and laboratory findings were discussed with them prior to my departure. Disposition of patient pending completion of workup, reevaluation, and clinical judgement of oncoming ED provider.   This chart was dictated using voice recognition software, Dragon. Despite the best efforts of this provider to proofread and correct errors, errors may still occur which can change documentation meaning.      Final diagnoses:  None    ED Discharge Orders     None          Bobette Pleasant JONELLE DEVONNA 08/01/23 0708    Theadore Ozell HERO, MD 08/04/23 [phone]

## 2023-08-01 NOTE — ED Notes (Signed)
 Pr. Left w/o receiving d/c papaerwork

## 2023-12-11 ENCOUNTER — Encounter: Admitting: Family Medicine

## 2023-12-11 ENCOUNTER — Telehealth: Admitting: Family Medicine

## 2023-12-11 DIAGNOSIS — J45909 Unspecified asthma, uncomplicated: Secondary | ICD-10-CM

## 2023-12-11 MED ORDER — PREDNISONE 10 MG (21) PO TBPK: ORAL_TABLET | ORAL | 0 refills | Status: AC

## 2023-12-11 MED ORDER — ALBUTEROL SULFATE HFA 108 (90 BASE) MCG/ACT IN AERS: 1.0000 | INHALATION_SPRAY | Freq: Four times a day (QID) | RESPIRATORY_TRACT | 0 refills | Status: DC | PRN

## 2023-12-11 NOTE — Progress Notes (Signed)
 E Visit for Asthma  Based on what you have shared with me, it looks like you may have a flare up of your asthma.  Asthma is a chronic (ongoing) lung disease which results in airway obstruction, inflammation and hyper-responsiveness.   Asthma symptoms vary from person to person, with common symptoms including nighttime awakening and decreased ability to participate in normal activities due to shortness of breath. It is often triggered by changes in weather, changes in the season, changes in air temperature, or inside (home, school, daycare or work) allergens such as animal dander, mold, mildew, woodstoves or cockroaches.   It can also be triggered by hormonal changes, extreme emotion, physical exertion or an upper respiratory tract illness.     It is important to identify the trigger and eliminate or avoid the trigger if possible.   If you have been prescribed medications to be taken on a regular basis, it is important to follow the asthma action plan and to follow guidelines to adjust medication in response to increasing symptoms of decreased peak expiratory flow rate.  Treatment: I have prescribed: Albuterol  (Proventil  HFA; Ventolin  HFA) 108 (90 Base) MCG/ACT Inhaler 2 puffs into the lungs every six hours as needed for wheezing or shortness of breath as well as a prednisone  steroid taper.   HOME CARE Only take medications as instructed by your medical team. Consider wearing a mask or scarf to improve breathing when there is poor air quality as this has been shown to decrease irritation and decrease exacerbations Get rest. Using a humidifier may help nasal congestion and ease sore throat pain. Using a saline nasal spray works much the same way.  Cough drops, hard candies and sore throat lozenges may ease your cough.  Avoid close contacts, especially the very you and the elderly. Cover  your mouth if you cough or sneeze. Always remember to wash your hands.   GET HELP RIGHT AWAY IF: You develop worsening shortness of breath/difficulty breathing or chest tightness, breathlessness at rest, drowsy, confused or agitated, unable to speak in full sentences, or if you develop chest pain.  You have coughing fits. You develop a severe headache or visual changes. You develop shortness of breath, difficulty breathing or start having chest pain. Your symptoms persist after you have completed your treatment plan. If your symptoms do not improve within 5 days.  MAKE SURE YOU Understand these instructions. Will watch your condition. Will get help right away if you are not doing well or get worse.   Your e-visit answers were reviewed by a board certified advanced clinical practitioner to complete your personal care plan, Depending upon the condition, your plan could have included both over the counter or prescription medications.  Please review your pharmacy choice. Your safety is important to us . If you have drug allergies check your prescription carefully. You can use MyChart to ask questions about today's visit, request a non-urgent call back, or ask for a work or school excuse for 24 hours related to this e-Visit. If it has been greater than 24 hours you will need to follow up with your provider, or enter a new e-Visit to address those concerns.  You will get an e-mail in the next two days asking about your experience. I hope that your e-visit has been valuable and will speed your recovery. Thank you for using e-visits.  I have spent 5 minutes in review of e-visit questionnaire, review and updating patient chart, medical decision making and response to patient.   Poseidon Pam  Kingston, PA-C

## 2023-12-11 NOTE — Progress Notes (Signed)
 This encounter was created in error - please disregard.

## 2024-01-03 ENCOUNTER — Telehealth: Admitting: Physician Assistant

## 2024-01-03 DIAGNOSIS — J4540 Moderate persistent asthma, uncomplicated: Secondary | ICD-10-CM

## 2024-01-03 MED ORDER — ALBUTEROL SULFATE HFA 108 (90 BASE) MCG/ACT IN AERS: 2.0000 | INHALATION_SPRAY | Freq: Four times a day (QID) | RESPIRATORY_TRACT | 0 refills | Status: DC | PRN

## 2024-01-03 NOTE — Progress Notes (Signed)
 E Visit for Asthma  Based on what you have shared with me, it looks like you may have a flare up of your asthma.  Asthma is a chronic (ongoing) lung disease which results in airway obstruction, inflammation and hyper-responsiveness.   Asthma symptoms vary from person to person, with common symptoms including nighttime awakening and decreased ability to participate in normal activities due to shortness of breath. It is often triggered by changes in weather, changes in the season, changes in air temperature, or inside (home, school, daycare or work) allergens such as animal dander, mold, mildew, woodstoves or cockroaches.   It can also be triggered by hormonal changes, extreme emotion, physical exertion or an upper respiratory tract illness.     It is important to identify the trigger and eliminate or avoid the trigger if possible.   If you have been prescribed medications to be taken on a regular basis, it is important to follow the asthma action plan and to follow guidelines to adjust medication in response to increasing symptoms of decreased peak expiratory flow rate.  Treatment: I have prescribed: Albuterol  (Proventil  HFA; Ventolin  HFA) 108 (90 Base) MCG/ACT Inhaler 2 puffs into the lungs every six hours as needed for wheezing or shortness of breath  If your inhaler alone is not providing relief, please let us  know.   HOME CARE Only take medications as instructed by your medical team. Consider wearing a mask or scarf to improve breathing when there is poor air quality as this has been shown to decrease irritation and decrease exacerbations Get rest. Using a humidifier may help nasal congestion and ease sore throat pain. Using a saline nasal spray works much the same way.  Cough drops, hard candies and sore throat lozenges may ease your cough.  Avoid close contacts, especially the  very you and the elderly. Cover your mouth if you cough or sneeze. Always remember to wash your hands.   GET HELP RIGHT AWAY IF: You develop worsening shortness of breath/difficulty breathing or chest tightness, breathlessness at rest, drowsy, confused or agitated, unable to speak in full sentences, or if you develop chest pain.  You have coughing fits. You develop a severe headache or visual changes. You develop shortness of breath, difficulty breathing or start having chest pain. Your symptoms persist after you have completed your treatment plan. If your symptoms do not improve within 5 days.  MAKE SURE YOU Understand these instructions. Will watch your condition. Will get help right away if you are not doing well or get worse.   Your e-visit answers were reviewed by a board certified advanced clinical practitioner to complete your personal care plan, Depending upon the condition, your plan could have included both over the counter or prescription medications.  Please review your pharmacy choice. Your safety is important to us . If you have drug allergies check your prescription carefully. You can use MyChart to ask questions about today's visit, request a non-urgent call back, or ask for a work or school excuse for 24 hours related to this e-Visit. If it has been greater than 24 hours you will need to follow up with your provider, or enter a new e-Visit to address those concerns.  You will get an e-mail in the next two days asking about your experience. I hope that your e-visit has been valuable and will speed your recovery. Thank you for using e-visits.  I have spent 5 minutes in review of e-visit questionnaire, review and updating patient chart, medical decision making and  response to patient.   Harlene PEDLAR Ward, PA-C

## 2024-01-24 ENCOUNTER — Emergency Department (HOSPITAL_COMMUNITY): Admission: EM | Admit: 2024-01-24 | Discharge: 2024-01-24 | Discharge status: Against Medical Advice

## 2024-01-24 ENCOUNTER — Encounter (HOSPITAL_COMMUNITY): Payer: Self-pay

## 2024-01-24 ENCOUNTER — Emergency Department (HOSPITAL_COMMUNITY)

## 2024-01-24 ENCOUNTER — Telehealth: Admitting: Family Medicine

## 2024-01-24 DIAGNOSIS — R079 Chest pain, unspecified: Secondary | ICD-10-CM | POA: Diagnosis not present

## 2024-01-24 DIAGNOSIS — R059 Cough, unspecified: Secondary | ICD-10-CM | POA: Diagnosis present

## 2024-01-24 DIAGNOSIS — J111 Influenza due to unidentified influenza virus with other respiratory manifestations: Secondary | ICD-10-CM

## 2024-01-24 DIAGNOSIS — Z5321 Procedure and treatment not carried out due to patient leaving prior to being seen by health care provider: Secondary | ICD-10-CM | POA: Insufficient documentation

## 2024-01-24 LAB — CBC
HCT: 31 % — ABNORMAL LOW (ref 36.0–46.0)
Hemoglobin: 9.3 g/dL — ABNORMAL LOW (ref 12.0–15.0)
MCH: 21.5 pg — ABNORMAL LOW (ref 26.0–34.0)
MCHC: 30 g/dL (ref 30.0–36.0)
MCV: 71.8 fL — ABNORMAL LOW (ref 80.0–100.0)
Platelets: 221 K/uL (ref 150–400)
RBC: 4.32 MIL/uL (ref 3.87–5.11)
RDW: 20.2 % — ABNORMAL HIGH (ref 11.5–15.5)
WBC: 7.7 K/uL (ref 4.0–10.5)
nRBC: 0 % (ref 0.0–0.2)

## 2024-01-24 LAB — HCG, SERUM, QUALITATIVE: Preg, Serum: NEGATIVE

## 2024-01-24 LAB — RESP PANEL BY RT-PCR (RSV, FLU A&B, COVID)  RVPGX2
Influenza A by PCR: POSITIVE — AB
Influenza B by PCR: NEGATIVE
Resp Syncytial Virus by PCR: NEGATIVE
SARS Coronavirus 2 by RT PCR: NEGATIVE

## 2024-01-24 LAB — BASIC METABOLIC PANEL WITH GFR
Anion gap: 13 (ref 5–15)
BUN: 8 mg/dL (ref 6–20)
CO2: 20 mmol/L — ABNORMAL LOW (ref 22–32)
Calcium: 9.2 mg/dL (ref 8.9–10.3)
Chloride: 106 mmol/L (ref 98–111)
Creatinine, Ser: 0.81 mg/dL (ref 0.44–1.00)
GFR, Estimated: >60 mL/min
Glucose, Bld: 116 mg/dL — ABNORMAL HIGH (ref 70–99)
Potassium: 3.1 mmol/L — ABNORMAL LOW (ref 3.5–5.1)
Sodium: 139 mmol/L (ref 135–145)

## 2024-01-24 LAB — TROPONIN T, HIGH SENSITIVITY: Troponin T High Sensitivity: <15 ng/L (ref 0–19)

## 2024-01-24 MED ORDER — BENZONATATE 200 MG PO CAPS: 200.0000 mg | ORAL_CAPSULE | Freq: Two times a day (BID) | ORAL | 0 refills | Status: AC | PRN

## 2024-01-24 MED ORDER — OSELTAMIVIR PHOSPHATE 75 MG PO CAPS: 75.0000 mg | ORAL_CAPSULE | Freq: Two times a day (BID) | ORAL | 0 refills | Status: AC

## 2024-01-24 NOTE — ED Notes (Signed)
 Pt did not answer when calling to room assignment, called twice

## 2024-01-24 NOTE — ED Triage Notes (Addendum)
 BIB EMS from home for cough with yellow sputum for 1 day. Pt was complaining that she could not breathe with EMS, received breathing treatments, 5 of albuterol , 0.5 of atrovent . Respirations equal and unlabored in triage C/o body aches, chills, states she is having chest pain and pain gets worse when coughing.

## 2024-01-24 NOTE — Progress Notes (Signed)
 E visit for Flu like symptoms   We are sorry that you are not feeling well.  Here is how we plan to help! Based on what you have shared with me it looks like you may have a respiratory virus that may be influenza.  Influenza or the flu is  an infection caused by a respiratory virus. The flu virus is highly contagious and persons who did not receive their yearly flu vaccination may catch the flu from close contact.  We have anti-viral medications to treat the viruses that cause this infection. They are not a cure and only shorten the course of the infection. These prescriptions are most effective when they are given within the first 2 days of flu symptoms. Antiviral medications are indicated if you have a high risk of complications from the flu. You should  also consider an antiviral medication if you are in close contact with someone who is at risk. These medications can help patients avoid complications from the flu but have side effects that you should know.   Possible side effects from Tamiflu or oseltamivir include nausea, vomiting, diarrhea, dizziness, headaches, eye redness, sleep problems or other respiratory symptoms. You should not take Tamiflu if you have an allergy to oseltamivir or any to the ingredients in Tamiflu.  Based upon your symptoms and potential risk factors I have prescribed Oseltamivir (Tamiflu).  It has been sent to your designated pharmacy.  You will take one 75 mg capsule orally twice a day for the next 5 days.   For nasal congestion, you may use an oral decongestant such as Mucinex D or if you have glaucoma or high blood pressure use plain Mucinex.  Saline nasal spray or nasal drops can help and can safely be used as often as needed for congestion.  If you have a sore or scratchy throat, use a saltwater gargle-  to  teaspoon of salt dissolved in a 4-ounce to 8-ounce glass of warm water.  Gargle the solution for approximately 15-30 seconds and then spit.  It is  important not to swallow the solution.  You can also use throat lozenges/cough drops and Chloraseptic spray to help with throat pain or discomfort.  Warm or cold liquids can also be helpful in relieving throat pain.  For headache, pain or general discomfort, you can use Ibuprofen or Tylenol  as directed.   Some authorities believe that zinc sprays or the use of Echinacea may shorten the course of your symptoms.  I have prescribed the following medications to help lessen symptoms: I have prescribed Tessalon Perles 100 mg. You may take 1-2 capsules every 8 hours as needed for cough  You are to isolate at home until you have been fever-free for at least 24 hours without a fever-reducing medication, and symptoms have been steadily improving for 24 hours.  If you must be around other household members who do not have symptoms, you need to make sure that both you and the family members are masking consistently with a high-quality mask.  If you note any worsening of symptoms despite treatment, please seek an in-person evaluation ASAP. If you note any significant shortness of breath or any chest pain, please seek ED evaluation. Please do not delay care!  ANYONE WHO HAS FLU SYMPTOMS SHOULD: Stay home. The flu is highly contagious and going out or to work exposes others! Be sure to drink plenty of fluids. Water is fine as well as fruit juices, sodas and electrolyte beverages. You may want to stay  away from caffeine or alcohol. If you are nauseated, try taking small sips of liquids. How do you know if you are getting enough fluid? Your urine should be a pale yellow or almost colorless. Get rest. Taking a steamy shower or using a humidifier may help nasal congestion and ease sore throat pain. Using a saline nasal spray works much the same way. Cough drops, hard candies and sore throat lozenges may ease your cough. Line up a caregiver. Have someone check on you regularly.  GET HELP RIGHT AWAY IF: You cannot  keep down liquids or your medications. You become short of breath Your fell like you are going to pass out or loose consciousness. Your symptoms persist after you have completed your treatment plan  MAKE SURE YOU  Understand these instructions. Will watch your condition. Will get help right away if you are not doing well or get worse.  Your e-visit answers were reviewed by a board certified advanced clinical practitioner to complete your personal care plan.  Depending on the condition, your plan could have included both over the counter or prescription medications.  If there is a problem please reply  once you have received a response from your provider.  Your safety is important to us .  If you have drug allergies check your prescription carefully.    You can use MyChart to ask questions about todays visit, request a non-urgent call back, or ask for a work or school excuse for 24 hours related to this e-Visit. If it has been greater than 24 hours you will need to follow up with your provider, or enter a new e-Visit to address those concerns.  You will get an e-mail in the next two days asking about your experience.  I hope that your e-visit has been valuable and will speed your recovery. Thank you for using e-visits.   I have spent 5 minutes in review of e-visit questionnaire, review and updating patient chart, medical decision making and response to patient.   Hans Rusher, FNP

## 2024-01-24 NOTE — ED Notes (Signed)
 Third call for pt, pt is not in lobby

## 2024-02-25 ENCOUNTER — Telehealth: Admitting: Family Medicine

## 2024-02-25 DIAGNOSIS — J4540 Moderate persistent asthma, uncomplicated: Secondary | ICD-10-CM | POA: Diagnosis not present

## 2024-02-25 MED ORDER — ALBUTEROL SULFATE HFA 108 (90 BASE) MCG/ACT IN AERS: 2.0000 | INHALATION_SPRAY | Freq: Four times a day (QID) | RESPIRATORY_TRACT | 0 refills | Status: AC | PRN

## 2024-02-25 NOTE — Progress Notes (Signed)
 "                                                                 E Visit for Asthma  Based on what you have shared with me, it looks like you may have a flare up of your asthma.  Asthma is a chronic (ongoing) lung disease which results in airway obstruction, inflammation and hyper-responsiveness.   Asthma symptoms vary from person to person, with common symptoms including nighttime awakening and decreased ability to participate in normal activities due to shortness of breath. It is often triggered by changes in weather, changes in the season, changes in air temperature, or inside (home, school, daycare or work) allergens such as animal dander, mold, mildew, woodstoves or cockroaches.   It can also be triggered by hormonal changes, extreme emotion, physical exertion or an upper respiratory tract illness.     It is important to identify the trigger and eliminate or avoid the trigger if possible.   If you have been prescribed medications to be taken on a regular basis, it is important to follow the asthma action plan and to follow guidelines to adjust medication in response to increasing symptoms of decreased peak expiratory flow rate.  Treatment: I have prescribed: Albuterol  (Proventil  HFA; Ventolin  HFA) 108 (90 Base) MCG/ACT Inhaler 2 puffs into the lungs every six hours as needed for wheezing or shortness of breath Please follow up with primary care provider for further refills.    HOME CARE Only take medications as instructed by your medical team. Consider wearing a mask or scarf to improve breathing when there is poor air quality as this has been shown to decrease irritation and decrease exacerbations Get rest. Using a humidifier may help nasal congestion and ease sore throat pain. Using a saline nasal spray works much the same way.  Cough drops, hard candies and sore throat lozenges may ease your cough.  Avoid close contacts, especially the very you and the elderly. Cover your mouth if you  cough or sneeze. Always remember to wash your hands.   GET HELP RIGHT AWAY IF: You develop worsening shortness of breath/difficulty breathing or chest tightness, breathlessness at rest, drowsy, confused or agitated, unable to speak in full sentences, or if you develop chest pain.  You have coughing fits. You develop a severe headache or visual changes. You develop shortness of breath, difficulty breathing or start having chest pain. Your symptoms persist after you have completed your treatment plan. If your symptoms do not improve within 5 days.  MAKE SURE YOU Understand these instructions. Will watch your condition. Will get help right away if you are not doing well or get worse.   Your e-visit answers were reviewed by a board certified advanced clinical practitioner to complete your personal care plan, Depending upon the condition, your plan could have included both over the counter or prescription medications.  Please review your pharmacy choice. Your safety is important to us . If you have drug allergies check your prescription carefully. You can use MyChart to ask questions about today's visit, request a non-urgent call back, or ask for a work or school excuse for 24 hours related to this e-Visit. If it has been greater than 24 hours you will need to follow up with your  provider, or enter a new e-Visit to address those concerns.  You will get an e-mail in the next two days asking about your experience. I hope that your e-visit has been valuable and will speed your recovery. Thank you for using e-visits.  I have spent 5 minutes in review of e-visit questionnaire, review and updating patient chart, medical decision making and response to patient.   Roosvelt Mater, PA-C    "
